# Patient Record
Sex: Male | Born: 1935 | Race: White | Hispanic: No | Marital: Married | Smoking: Former smoker
Health system: Southern US, Community
[De-identification: ages and names within clinical notes are randomized; demographics above are authoritative.]

## PROBLEM LIST (undated history)

## (undated) DIAGNOSIS — E119 Type 2 diabetes mellitus without complications: Secondary | ICD-10-CM

## (undated) DIAGNOSIS — G629 Polyneuropathy, unspecified: Secondary | ICD-10-CM

## (undated) DIAGNOSIS — Z8719 Personal history of other diseases of the digestive system: Secondary | ICD-10-CM

## (undated) DIAGNOSIS — I639 Cerebral infarction, unspecified: Secondary | ICD-10-CM

## (undated) DIAGNOSIS — G459 Transient cerebral ischemic attack, unspecified: Secondary | ICD-10-CM

## (undated) DIAGNOSIS — Z955 Presence of coronary angioplasty implant and graft: Secondary | ICD-10-CM

## (undated) DIAGNOSIS — I10 Essential (primary) hypertension: Secondary | ICD-10-CM

## (undated) DIAGNOSIS — Z9889 Other specified postprocedural states: Secondary | ICD-10-CM

## (undated) HISTORY — PX: OTHER SURGICAL HISTORY: SHX169

## (undated) HISTORY — PX: CARDIAC CATHETERIZATION: SHX172

## (undated) HISTORY — PX: HERNIA REPAIR: SHX51

## (undated) HISTORY — PX: EYE SURGERY: SHX253

## (undated) HISTORY — PX: PROSTATE SURGERY: SHX751

## (undated) HISTORY — PX: CORONARY ANGIOPLASTY: SHX604

---

## 2010-06-08 ENCOUNTER — Emergency Department (HOSPITAL_BASED_OUTPATIENT_CLINIC_OR_DEPARTMENT_OTHER): Admission: EM | Admit: 2010-06-08 | Discharge: 2010-06-08 | Payer: Self-pay | Admitting: Emergency Medicine

## 2010-06-08 ENCOUNTER — Ambulatory Visit: Payer: Self-pay | Admitting: Diagnostic Radiology

## 2015-05-14 ENCOUNTER — Emergency Department (HOSPITAL_COMMUNITY): Payer: Medicare Other

## 2015-05-14 ENCOUNTER — Encounter (HOSPITAL_COMMUNITY): Payer: Self-pay

## 2015-05-14 ENCOUNTER — Emergency Department (HOSPITAL_COMMUNITY)
Admission: EM | Admit: 2015-05-14 | Discharge: 2015-05-14 | Disposition: A | Discharge status: Home / Self Care | Payer: Medicare Other | Attending: Emergency Medicine | Admitting: Emergency Medicine

## 2015-05-14 DIAGNOSIS — Z8669 Personal history of other diseases of the nervous system and sense organs: Secondary | ICD-10-CM | POA: Diagnosis not present

## 2015-05-14 DIAGNOSIS — Z87891 Personal history of nicotine dependence: Secondary | ICD-10-CM | POA: Diagnosis not present

## 2015-05-14 DIAGNOSIS — X58XXXA Exposure to other specified factors, initial encounter: Secondary | ICD-10-CM | POA: Insufficient documentation

## 2015-05-14 DIAGNOSIS — Z7982 Long term (current) use of aspirin: Secondary | ICD-10-CM | POA: Insufficient documentation

## 2015-05-14 DIAGNOSIS — Y9389 Activity, other specified: Secondary | ICD-10-CM | POA: Insufficient documentation

## 2015-05-14 DIAGNOSIS — Z79899 Other long term (current) drug therapy: Secondary | ICD-10-CM | POA: Insufficient documentation

## 2015-05-14 DIAGNOSIS — Y998 Other external cause status: Secondary | ICD-10-CM | POA: Diagnosis not present

## 2015-05-14 DIAGNOSIS — T18128A Food in esophagus causing other injury, initial encounter: Secondary | ICD-10-CM

## 2015-05-14 DIAGNOSIS — Y9289 Other specified places as the place of occurrence of the external cause: Secondary | ICD-10-CM | POA: Diagnosis not present

## 2015-05-14 DIAGNOSIS — E119 Type 2 diabetes mellitus without complications: Secondary | ICD-10-CM | POA: Diagnosis not present

## 2015-05-14 DIAGNOSIS — Z8673 Personal history of transient ischemic attack (TIA), and cerebral infarction without residual deficits: Secondary | ICD-10-CM | POA: Insufficient documentation

## 2015-05-14 DIAGNOSIS — I10 Essential (primary) hypertension: Secondary | ICD-10-CM | POA: Diagnosis not present

## 2015-05-14 DIAGNOSIS — T18120A Food in esophagus causing compression of trachea, initial encounter: Secondary | ICD-10-CM | POA: Diagnosis present

## 2015-05-14 DIAGNOSIS — Z9861 Coronary angioplasty status: Secondary | ICD-10-CM | POA: Diagnosis not present

## 2015-05-14 DIAGNOSIS — K222 Esophageal obstruction: Secondary | ICD-10-CM

## 2015-05-14 HISTORY — DX: Presence of coronary angioplasty implant and graft: Z95.5

## 2015-05-14 HISTORY — DX: Other specified postprocedural states: Z98.890

## 2015-05-14 HISTORY — DX: Type 2 diabetes mellitus without complications: E11.9

## 2015-05-14 HISTORY — DX: Transient cerebral ischemic attack, unspecified: G45.9

## 2015-05-14 HISTORY — DX: Personal history of other diseases of the digestive system: Z87.19

## 2015-05-14 HISTORY — DX: Polyneuropathy, unspecified: G62.9

## 2015-05-14 HISTORY — DX: Essential (primary) hypertension: I10

## 2015-05-14 LAB — BASIC METABOLIC PANEL
Anion gap: 9 (ref 5–15)
BUN: 34 mg/dL — ABNORMAL HIGH (ref 6–20)
CALCIUM: 9.4 mg/dL (ref 8.9–10.3)
CO2: 27 mmol/L (ref 22–32)
CREATININE: 1.74 mg/dL — AB (ref 0.61–1.24)
Chloride: 107 mmol/L (ref 101–111)
GFR, EST AFRICAN AMERICAN: 39 mL/min — AB (ref >60)
GFR, EST NON AFRICAN AMERICAN: 34 mL/min — AB (ref >60)
Glucose, Bld: 147 mg/dL — ABNORMAL HIGH (ref 65–99)
Potassium: 3.4 mmol/L — ABNORMAL LOW (ref 3.5–5.1)
SODIUM: 143 mmol/L (ref 135–145)

## 2015-05-14 LAB — CBC WITH DIFFERENTIAL/PLATELET
BASOS PCT: 0 % (ref 0–1)
Basophils Absolute: 0 10*3/uL (ref 0.0–0.1)
EOS ABS: 0.2 10*3/uL (ref 0.0–0.7)
EOS PCT: 2 % (ref 0–5)
HCT: 39.7 % (ref 39.0–52.0)
Hemoglobin: 13.9 g/dL (ref 13.0–17.0)
Lymphocytes Relative: 18 % (ref 12–46)
Lymphs Abs: 1.8 10*3/uL (ref 0.7–4.0)
MCH: 30.1 pg (ref 26.0–34.0)
MCHC: 35 g/dL (ref 30.0–36.0)
MCV: 85.9 fL (ref 78.0–100.0)
MONO ABS: 0.6 10*3/uL (ref 0.1–1.0)
MONOS PCT: 6 % (ref 3–12)
Neutro Abs: 7.8 10*3/uL — ABNORMAL HIGH (ref 1.7–7.7)
Neutrophils Relative %: 74 % (ref 43–77)
PLATELETS: 161 10*3/uL (ref 150–400)
RBC: 4.62 MIL/uL (ref 4.22–5.81)
RDW: 12.4 % (ref 11.5–15.5)
WBC: 10.4 10*3/uL (ref 4.0–10.5)

## 2015-05-14 MED ORDER — DIAZEPAM 5 MG/ML IJ SOLN
2.5000 mg | Freq: Once | INTRAMUSCULAR | Status: AC
  Administered 2015-05-14: 2.5 mg via INTRAVENOUS
  Filled 2015-05-14: qty 2

## 2015-05-14 MED ORDER — GLUCAGON HCL RDNA (DIAGNOSTIC) 1 MG IJ SOLR
1.0000 mg | Freq: Once | INTRAMUSCULAR | Status: AC
  Administered 2015-05-14: 1 mg via INTRAVENOUS
  Filled 2015-05-14: qty 1

## 2015-05-14 MED ORDER — NITROGLYCERIN 0.4 MG SL SUBL
0.4000 mg | SUBLINGUAL_TABLET | Freq: Once | SUBLINGUAL | Status: AC
  Administered 2015-05-14: 0.4 mg via SUBLINGUAL
  Filled 2015-05-14: qty 1

## 2015-05-14 NOTE — ED Notes (Signed)
Per EMS, pt from home. 3 hours ago, pt choked on a piece of apple.  Pt coughed up small amount of apple.  Pt exhibits in no resp distress and speaks in complete sentences.  Pt feels "scratchy" feeling substernal.  Problems in past with aspiration.  Swallowing problems in past with dilation of esophagus.  Vitals: 118/68, hr 72, resp 16, 98% ra.  cbg 140

## 2015-05-14 NOTE — ED Notes (Signed)
Bed: WA03 Expected date:  Expected time:  Means of arrival:  Comments: EMS- 81yo M, "got choked on an apple"

## 2015-05-14 NOTE — Discharge Instructions (Signed)
Esophageal Stricture °The esophagus is the long, narrow tube which carries food and liquid from the mouth to the stomach. Sometimes a part of the esophagus becomes narrow and makes it difficult, painful, or even impossible to swallow. This is called an esophageal stricture.  °CAUSES  °Common causes of blockage or strictures of the esophagus are: °· Exposure of the lower esophagus to the acid from the stomach may cause narrowing. °· Hiatal hernia in which a small part of the stomach bulges up through the diaphragm can cause a narrowing in the bottom of the esophagus. °· Scleroderma is a tissue disorder that affects the esophagus and makes swallowing difficult. °· Achalasia is an absence of nerves in the lower esophagus and to the esophageal sphincter. This absence of nerves may be congenital (present since birth). This can cause irregular spasms which do not allow food and fluid through. °· Strictures may develop from swallowing materials which damage the esophagus. Examples are acids or alkalis such as lye. °· Schatzki's Ring is a narrow ring of non-cancerous tissue which narrows the lower esophagus. The cause of this is unknown. °· Growths can block the esophagus. °SYMPTOMS  °Some of the problems are difficulty swallowing or pain with swallowing. °DIAGNOSIS  °Your caregiver often suspects this problem by taking a medical history. They will also do a physical exam. They may then take X-rays and/or perform an endoscopy. Endoscopy is an exam in which a tube like a small flexible telescope is used to look at your esophagus.  °TREATMENT °· One form of treatment is to dilate the narrow area. This means to stretch it. °· When this is not successful, chest surgery may be required. This is a much more extensive form of treatment with a longer recovery time. °Both of the above treatments make the passage of food and water into the stomach easier. They also make it easier for stomach contents to bubble back into the  esophagus. Special medications may be used following the procedure to help prevent further narrowing. Medications may be used to lower the amount of acid in the stomach juice.  °SEEK IMMEDIATE MEDICAL CARE IF:  °· Your swallowing is becoming more painful, difficult, or you are unable to swallow. °· You vomit up blood. °· You develop black tarry stools. °· You develop chills. °· You have a fever. °· You develop chest or abdominal pain. °· You develop shortness of breath, feel lightheaded, or faint. °Follow up with medical care as your caregiver suggests. °Document Released: 05/10/2006 Document Revised: 11/22/2011 Document Reviewed: 01/09/2014 °Exit[employer]® Patient Information ©2015 [employer]. This information is not intended to replace advice given to you by your health care provider. Make sure you discuss any questions you have with your health care provider. ° °

## 2015-05-14 NOTE — ED Provider Notes (Signed)
CSN: [id number]     Arrival date & time 05/14/15  1754 History   First MD Initiated Contact with Patient 05/14/15 1758     Chief Complaint  Patient presents with  . Aspiration  . Choking     (Consider location/radiation/quality/duration/timing/severity/associated sxs/prior Treatment) Patient is a 81 y.o. male presenting with foreign body swallowed.  Swallowed Foreign Body This is a recurrent problem. The current episode started 1 to 2 hours ago. The problem occurs constantly. Progression since onset: partially resolved. Pertinent negatives include no chest pain, no abdominal pain, no headaches and no shortness of breath. Nothing aggravates the symptoms. Nothing relieves the symptoms. He has tried nothing for the symptoms.    Past Medical History  Diagnosis Date  . Diabetes mellitus without complication   . TIA (transient ischemic attack)   . Neuropathy   . Status post dilation of esophageal narrowing   . Hypertension   . Stented coronary artery    Past Surgical History  Procedure Laterality Date  . Cardiac catheterization    . Hernia repair    . Prostate surgery    . Eye surgery     History reviewed. No pertinent family history. Social History  Substance Use Topics  . Smoking status: Former Games developer  . Smokeless tobacco: None  . Alcohol Use: Yes     Comment: daily    Review of Systems  Respiratory: Negative for shortness of breath.   Cardiovascular: Negative for chest pain.  Gastrointestinal: Negative for abdominal pain.  Neurological: Negative for headaches.  All other systems reviewed and are negative.     Allergies  Ibuprofen  Home Medications   Prior to Admission medications   Medication Sig Start Date End Date Taking? Authorizing Provider  aspirin 325 MG tablet Take 325 mg by mouth daily.   Yes Historical Provider, MD  atorvastatin (LIPITOR) 40 MG tablet Take 40 mg by mouth daily. 03/10/15 03/09/16 Yes Historical Provider, MD  B Complex Vitamins (VITAMIN-B  COMPLEX) TABS Take 1 tablet by mouth daily.   Yes Historical Provider, MD  Cholecalciferol (VITAMIN D-1000 MAX ST) 1000 UNITS tablet Take 1 capsule by mouth daily.   Yes Historical Provider, MD  esomeprazole (NEXIUM) 40 MG capsule Take 40 mg by mouth daily. 03/05/15  Yes Historical Provider, MD  glipiZIDE (GLUCOTROL XL) 10 MG 24 hr tablet Take 10 mg by mouth daily. 03/05/15  Yes Historical Provider, MD  losartan-hydrochlorothiazide (HYZAAR) 100-25 MG per tablet Take 1 tablet by mouth daily. 03/05/15  Yes Historical Provider, MD  metFORMIN (GLUCOPHAGE) 1000 MG tablet Take 1,000 mg by mouth 2 (two) times daily with a meal.   Yes Historical Provider, MD  pioglitazone (ACTOS) 45 MG tablet Take 45 mg by mouth daily. 03/10/15 03/09/16 Yes Historical Provider, MD   BP 137/108 mmHg  Pulse 94  Temp(Src) 99.5 F (37.5 C) (Oral)  Resp 20  SpO2 99% Physical Exam  Constitutional: He is oriented to person, place, and time. He appears well-developed and well-nourished.  HENT:  Head: Normocephalic and atraumatic.  Eyes: Conjunctivae and EOM are normal.  Neck: Normal range of motion. Neck supple.  Cardiovascular: Normal rate, regular rhythm and normal heart sounds.   Pulmonary/Chest: Effort normal and breath sounds normal. No respiratory distress.  Abdominal: He exhibits no distension. There is no tenderness. There is no rebound and no guarding.  Musculoskeletal: Normal range of motion.  Neurological: He is alert and oriented to person, place, and time.  Skin: Skin is warm and dry.  Vitals  reviewed.   ED Course  Procedures (including critical care time) Labs Review Labs Reviewed  CBC WITH DIFFERENTIAL/PLATELET - Abnormal; Notable for the following:    Neutro Abs 7.8 (*)    All other components within normal limits  BASIC METABOLIC PANEL - Abnormal; Notable for the following:    Potassium 3.4 (*)    Glucose, Bld 147 (*)    BUN 34 (*)    Creatinine, Ser 1.74 (*)    GFR calc non Af Amer 34 (*)     GFR calc Af Amer 39 (*)    All other components within normal limits    Imaging Review Dg Chest 2 View  05/14/2015   CLINICAL DATA:  Choked on piece of apple 3 hours ago with a scratchy substernal sensation. Previous esophageal dilatation procedure.  EXAM: CHEST  2 VIEW  COMPARISON:  None.  FINDINGS: Lungs are hypoinflated without consolidation or effusion. Cardiomediastinal silhouette is within normal. There is mild degenerate change of the spine.  IMPRESSION: Hypoinflation without acute cardiopulmonary disease.   Electronically Signed   By: Elberta Fortis M.D.   On: 05/14/2015 19:32   I have personally reviewed and evaluated these images and lab results as part of my medical decision-making.   EKG Interpretation None      MDM   Final diagnoses:  Esophageal obstruction due to food impaction    81 y.o. male with pertinent PMH of esophageal stricture sp dilatation presents with recurrent symptoms of impaction after eating an apple.  Unable to take PO on my exam, spitting up frequently.  Physical exam otherwise benign.    Given valium, nitro, and glucagon with resolution of symptoms.  Tolerated PO.  DC home in stable condition  I have reviewed all laboratory and imaging studies if ordered as above  1. Esophageal obstruction due to food impaction         Mirian Mo, MD 05/14/15 [phone]

## 2015-06-17 ENCOUNTER — Emergency Department (HOSPITAL_COMMUNITY): Payer: Medicare Other

## 2015-06-17 ENCOUNTER — Inpatient Hospital Stay (HOSPITAL_COMMUNITY)
Admission: EM | Admit: 2015-06-17 | Discharge: 2015-06-20 | DRG: 065 | Disposition: A | Discharge status: Skilled Nursing Facility | Payer: Medicare Other | Attending: Internal Medicine | Admitting: Internal Medicine

## 2015-06-17 ENCOUNTER — Encounter (HOSPITAL_COMMUNITY): Payer: Self-pay | Admitting: Emergency Medicine

## 2015-06-17 DIAGNOSIS — Z79891 Long term (current) use of opiate analgesic: Secondary | ICD-10-CM

## 2015-06-17 DIAGNOSIS — I672 Cerebral atherosclerosis: Secondary | ICD-10-CM | POA: Diagnosis present

## 2015-06-17 DIAGNOSIS — Z955 Presence of coronary angioplasty implant and graft: Secondary | ICD-10-CM | POA: Diagnosis not present

## 2015-06-17 DIAGNOSIS — Z87891 Personal history of nicotine dependence: Secondary | ICD-10-CM

## 2015-06-17 DIAGNOSIS — T502X5A Adverse effect of carbonic-anhydrase inhibitors, benzothiadiazides and other diuretics, initial encounter: Secondary | ICD-10-CM | POA: Diagnosis present

## 2015-06-17 DIAGNOSIS — I2510 Atherosclerotic heart disease of native coronary artery without angina pectoris: Secondary | ICD-10-CM

## 2015-06-17 DIAGNOSIS — E785 Hyperlipidemia, unspecified: Secondary | ICD-10-CM | POA: Diagnosis present

## 2015-06-17 DIAGNOSIS — E1122 Type 2 diabetes mellitus with diabetic chronic kidney disease: Secondary | ICD-10-CM | POA: Diagnosis present

## 2015-06-17 DIAGNOSIS — E876 Hypokalemia: Secondary | ICD-10-CM | POA: Diagnosis present

## 2015-06-17 DIAGNOSIS — R262 Difficulty in walking, not elsewhere classified: Secondary | ICD-10-CM | POA: Diagnosis present

## 2015-06-17 DIAGNOSIS — Z886 Allergy status to analgesic agent status: Secondary | ICD-10-CM

## 2015-06-17 DIAGNOSIS — R296 Repeated falls: Secondary | ICD-10-CM | POA: Diagnosis present

## 2015-06-17 DIAGNOSIS — I6789 Other cerebrovascular disease: Secondary | ICD-10-CM | POA: Diagnosis not present

## 2015-06-17 DIAGNOSIS — I129 Hypertensive chronic kidney disease with stage 1 through stage 4 chronic kidney disease, or unspecified chronic kidney disease: Secondary | ICD-10-CM | POA: Diagnosis present

## 2015-06-17 DIAGNOSIS — N401 Enlarged prostate with lower urinary tract symptoms: Secondary | ICD-10-CM | POA: Diagnosis present

## 2015-06-17 DIAGNOSIS — R4781 Slurred speech: Secondary | ICD-10-CM | POA: Diagnosis present

## 2015-06-17 DIAGNOSIS — I639 Cerebral infarction, unspecified: Principal | ICD-10-CM | POA: Diagnosis present

## 2015-06-17 DIAGNOSIS — N183 Chronic kidney disease, stage 3 (moderate): Secondary | ICD-10-CM | POA: Diagnosis present

## 2015-06-17 DIAGNOSIS — F05 Delirium due to known physiological condition: Secondary | ICD-10-CM | POA: Diagnosis present

## 2015-06-17 DIAGNOSIS — G629 Polyneuropathy, unspecified: Secondary | ICD-10-CM | POA: Diagnosis present

## 2015-06-17 DIAGNOSIS — F0390 Unspecified dementia without behavioral disturbance: Secondary | ICD-10-CM | POA: Diagnosis present

## 2015-06-17 DIAGNOSIS — Z79899 Other long term (current) drug therapy: Secondary | ICD-10-CM

## 2015-06-17 DIAGNOSIS — I6522 Occlusion and stenosis of left carotid artery: Secondary | ICD-10-CM | POA: Diagnosis not present

## 2015-06-17 DIAGNOSIS — R2681 Unsteadiness on feet: Secondary | ICD-10-CM | POA: Diagnosis not present

## 2015-06-17 DIAGNOSIS — I6529 Occlusion and stenosis of unspecified carotid artery: Secondary | ICD-10-CM | POA: Diagnosis present

## 2015-06-17 DIAGNOSIS — Z7982 Long term (current) use of aspirin: Secondary | ICD-10-CM | POA: Diagnosis not present

## 2015-06-17 DIAGNOSIS — E1149 Type 2 diabetes mellitus with other diabetic neurological complication: Secondary | ICD-10-CM | POA: Diagnosis present

## 2015-06-17 DIAGNOSIS — R338 Other retention of urine: Secondary | ICD-10-CM | POA: Diagnosis present

## 2015-06-17 LAB — URINALYSIS, ROUTINE W REFLEX MICROSCOPIC
Glucose, UA: NEGATIVE mg/dL
HGB URINE DIPSTICK: NEGATIVE
Ketones, ur: NEGATIVE mg/dL
LEUKOCYTES UA: NEGATIVE
Nitrite: NEGATIVE
PROTEIN: 30 mg/dL — AB
SPECIFIC GRAVITY, URINE: 1.027 (ref 1.005–1.030)
UROBILINOGEN UA: 1 mg/dL (ref 0.0–1.0)
pH: 5.5 (ref 5.0–8.0)

## 2015-06-17 LAB — CBC WITH DIFFERENTIAL/PLATELET
Basophils Absolute: 0 10*3/uL (ref 0.0–0.1)
Basophils Relative: 0 %
Eosinophils Absolute: 0.3 10*3/uL (ref 0.0–0.7)
Eosinophils Relative: 4 %
HEMATOCRIT: 37.6 % — AB (ref 39.0–52.0)
HEMOGLOBIN: 13.2 g/dL (ref 13.0–17.0)
LYMPHS ABS: 2.2 10*3/uL (ref 0.7–4.0)
LYMPHS PCT: 28 %
MCH: 30.1 pg (ref 26.0–34.0)
MCHC: 35.1 g/dL (ref 30.0–36.0)
MCV: 85.8 fL (ref 78.0–100.0)
MONOS PCT: 9 %
Monocytes Absolute: 0.7 10*3/uL (ref 0.1–1.0)
NEUTROS ABS: 4.8 10*3/uL (ref 1.7–7.7)
NEUTROS PCT: 59 %
Platelets: 183 10*3/uL (ref 150–400)
RBC: 4.38 MIL/uL (ref 4.22–5.81)
RDW: 12.5 % (ref 11.5–15.5)
WBC: 8 10*3/uL (ref 4.0–10.5)

## 2015-06-17 LAB — BASIC METABOLIC PANEL
Anion gap: 9 (ref 5–15)
BUN: 33 mg/dL — AB (ref 6–20)
CHLORIDE: 106 mmol/L (ref 101–111)
CO2: 28 mmol/L (ref 22–32)
CREATININE: 1.57 mg/dL — AB (ref 0.61–1.24)
Calcium: 8.8 mg/dL — ABNORMAL LOW (ref 8.9–10.3)
GFR calc non Af Amer: 38 mL/min — ABNORMAL LOW (ref >60)
GFR, EST AFRICAN AMERICAN: 44 mL/min — AB (ref >60)
Glucose, Bld: 181 mg/dL — ABNORMAL HIGH (ref 65–99)
POTASSIUM: 3.1 mmol/L — AB (ref 3.5–5.1)
Sodium: 143 mmol/L (ref 135–145)

## 2015-06-17 LAB — I-STAT TROPONIN, ED: TROPONIN I, POC: 0 ng/mL (ref 0.00–0.08)

## 2015-06-17 LAB — URINE MICROSCOPIC-ADD ON

## 2015-06-17 LAB — CBG MONITORING, ED: Glucose-Capillary: 185 mg/dL — ABNORMAL HIGH (ref 65–99)

## 2015-06-17 NOTE — ED Notes (Signed)
Patient passed swallow screen with no problem

## 2015-06-17 NOTE — ED Provider Notes (Signed)
CSN: [id number]     Arrival date & time 06/17/15  1716 History   First MD Initiated Contact with Patient 06/17/15 1908     Chief Complaint  Patient presents with  . Fall     (Consider location/radiation/quality/duration/timing/severity/associated sxs/prior Treatment) HPI Comments: Patient presents to the emergency department with chief complaint of fall. Patient states that he has been having increasingly frequent falls over the past 2 weeks. States that he feels like he loses his balance, and cannot get his feet back underneath him. He is here with his family members, he states that he has fallen approximately 3 or 4 times today. They are unable to help the patient back to his feet, and have had called EMS 3 times. Patient reports an episode of slurred speech and confusion several weeks ago, but states that he took an aspirin, and went back to sleep, and then this resolved. Patient states that he has some right-sided rib pain from a previous fall, but denies any other chest pain or shortness breath. He denies any abdominal pain. States that he has had some dysuria, but states that this is not new for him.  The history is provided by the patient. No language interpreter was used.    Past Medical History  Diagnosis Date  . Diabetes mellitus without complication (HCC)   . TIA (transient ischemic attack)   . Neuropathy (HCC)   . Status post dilation of esophageal narrowing   . Hypertension   . Stented coronary artery    Past Surgical History  Procedure Laterality Date  . Cardiac catheterization    . Hernia repair    . Prostate surgery    . Eye surgery     No family history on file. Social History  Substance Use Topics  . Smoking status: Former Games developer  . Smokeless tobacco: None  . Alcohol Use: Yes     Comment: daily    Review of Systems  Constitutional: Negative for fever and chills.  Respiratory: Negative for shortness of breath.   Cardiovascular: Negative for chest pain.   Gastrointestinal: Negative for nausea, vomiting, diarrhea and constipation.  Genitourinary: Negative for dysuria.  Neurological: Positive for weakness.  All other systems reviewed and are negative.     Allergies  Ibuprofen  Home Medications   Prior to Admission medications   Medication Sig Start Date End Date Taking? Authorizing Provider  aspirin 325 MG tablet Take 325 mg by mouth daily.   Yes Historical Provider, MD  atorvastatin (LIPITOR) 40 MG tablet Take 40 mg by mouth daily. 03/10/15 03/09/16 Yes Historical Provider, MD  esomeprazole (NEXIUM) 40 MG capsule Take 40 mg by mouth daily. 03/05/15  Yes Historical Provider, MD  glipiZIDE (GLUCOTROL XL) 10 MG 24 hr tablet Take 10 mg by mouth daily. 03/05/15  Yes Historical Provider, MD  HYDROcodone-acetaminophen (NORCO/VICODIN) 5-325 MG tablet Take 1 tablet by mouth every 6 (six) hours as needed for moderate pain or severe pain.  06/12/15  Yes Historical Provider, MD  losartan-hydrochlorothiazide (HYZAAR) 100-25 MG per tablet Take 1 tablet by mouth daily. 03/05/15  Yes Historical Provider, MD  pioglitazone (ACTOS) 45 MG tablet Take 45 mg by mouth daily. 03/10/15 03/09/16 Yes Historical Provider, MD   BP 132/55 mmHg  Pulse 50  Temp(Src) 97.8 F (36.6 C) (Oral)  Resp 16  SpO2 95% Physical Exam  Constitutional: He is oriented to person, place, and time. He appears well-developed and well-nourished.  HENT:  Head: Normocephalic and atraumatic.  Eyes: Conjunctivae and EOM  are normal. Pupils are equal, round, and reactive to light. Right eye exhibits no discharge. Left eye exhibits no discharge. No scleral icterus.  Neck: Normal range of motion. Neck supple. No JVD present.  Cardiovascular: Normal rate, regular rhythm and normal heart sounds.  Exam reveals no gallop and no friction rub.   No murmur heard. Pulmonary/Chest: Effort normal and breath sounds normal. No respiratory distress. He has no wheezes. He has no rales. He exhibits no  tenderness.  Abdominal: Soft. He exhibits no distension and no mass. There is no tenderness. There is no rebound and no guarding.  Musculoskeletal: Normal range of motion. He exhibits no edema or tenderness.  Moves all extremities  Neurological: He is alert and oriented to person, place, and time.  CN III-12 intact, speech is clear, movements are goal oriented  Skin: Skin is warm and dry.  Psychiatric: He has a normal mood and affect. His behavior is normal. Judgment and thought content normal.  Nursing note and vitals reviewed.   ED Course  Procedures (including critical care time) Results for orders placed or performed during the hospital encounter of 06/17/15  CBC with Differential/Platelet  Result Value Ref Range   WBC 8.0 4.0 - 10.5 K/uL   RBC 4.38 4.22 - 5.81 MIL/uL   Hemoglobin 13.2 13.0 - 17.0 g/dL   HCT 16.1 (L) 09.6 - 04.5 %   MCV 85.8 78.0 - 100.0 fL   MCH 30.1 26.0 - 34.0 pg   MCHC 35.1 30.0 - 36.0 g/dL   RDW 40.9 81.1 - 91.4 %   Platelets 183 150 - 400 K/uL   Neutrophils Relative % 59 %   Neutro Abs 4.8 1.7 - 7.7 K/uL   Lymphocytes Relative 28 %   Lymphs Abs 2.2 0.7 - 4.0 K/uL   Monocytes Relative 9 %   Monocytes Absolute 0.7 0.1 - 1.0 K/uL   Eosinophils Relative 4 %   Eosinophils Absolute 0.3 0.0 - 0.7 K/uL   Basophils Relative 0 %   Basophils Absolute 0.0 0.0 - 0.1 K/uL  Basic metabolic panel  Result Value Ref Range   Sodium 143 135 - 145 mmol/L   Potassium 3.1 (L) 3.5 - 5.1 mmol/L   Chloride 106 101 - 111 mmol/L   CO2 28 22 - 32 mmol/L   Glucose, Bld 181 (H) 65 - 99 mg/dL   BUN 33 (H) 6 - 20 mg/dL   Creatinine, Ser 7.82 (H) 0.61 - 1.24 mg/dL   Calcium 8.8 (L) 8.9 - 10.3 mg/dL   GFR calc non Af Amer 38 (L) >60 mL/min   GFR calc Af Amer 44 (L) >60 mL/min   Anion gap 9 5 - 15  POC CBG, ED  Result Value Ref Range   Glucose-Capillary 185 (H) 65 - 99 mg/dL  I-Stat Troponin, ED (not at [facility])  Result Value Ref Range   Troponin i, poc 0.00 0.00 - 0.08 ng/mL    Comment 3           Dg Chest 2 View  06/17/2015   CLINICAL DATA:  Falls.  EXAM: CHEST  2 VIEW  COMPARISON:  May 14, 2015.  FINDINGS: The heart size and mediastinal contours are within normal limits. Both lungs are clear. No pneumothorax or pleural effusion is noted. The visualized skeletal structures are unremarkable.  IMPRESSION: No active cardiopulmonary disease.   Electronically Signed   By: Lupita Raider, M.D.   On: 06/17/2015 20:37   Mr Brain Wo Contrast  06/17/2015  CLINICAL DATA:  Initial evaluation for increase falls for past 2 weeks. Loss of balance.  EXAM: MRI HEAD WITHOUT CONTRAST  TECHNIQUE: Multiplanar, multiecho pulse sequences of the brain and surrounding structures were obtained without intravenous contrast.  COMPARISON:  Prior CT from 09/21/2011.  FINDINGS: Study is markedly degraded by motion artifact.  Diffuse prominence of the CSF containing spaces is compatible with generalized age-related cerebral atrophy. Extensive patchy and confluent T2/FLAIR hyperintensity within the periventricular deep white matter both cerebral hemispheres noted, most likely related to moderate chronic small vessel ischemic disease. Encephalomalacia within the parasagittal anterior right frontal lobe most likely related to remote right ACA territory infarct. Remote lacunar infarct present within the right basal ganglia extending into the right corona radiata. Additional possible small remote infarcts within the right cerebellar hemisphere.  Diffusion-weighted sequences limited by motion artifact. There is patchy cortical infarct within the high left parietal lobe, best appreciated on coronal DWI sequence (series 5, image 11). Additionally, there is patchy infarcts extending superiorly along the deep/periventricular white matter of the left centrum semi ovale. Probable a few small foci of patchy cortical infarct more anteriorly within the left frontal lobe (series 702, image 21). Correlation with ADC map  markedly limited on this study due to motion artifact. Distribution of these infarcts suggestive of possible underlying watershed etiology.  Intravascular flow voids grossly maintained. No definite acute or chronic intracranial hemorrhage.  No definite mass lesion. No midline shift or mass effect. Ventricular prominence related to global parenchymal volume loss present without hydrocephalus. No extra-axial fluid collection.  Craniocervical junction within normal limits. Pituitary gland grossly unremarkable. No definite acute abnormality about the orbits. Probable lens extraction on the right.  Scattered mucosal thickening within the ethmoidal air cells and left maxillary sinus. No definite mastoid effusion.  Bone marrow signal intensity within normal limits. Scalp soft tissues unremarkable.  IMPRESSION: 1. Motion degraded study. 2. Patchy acute ischemic infarcts involving the cortical gray matter and deep/periventricular white matter of the left parietal lobe extending anteriorly into the left frontal lobe as above. 3. No other definite acute intracranial process. 4. Remote infarcts involving the anterior right frontal lobe, right basal ganglia/corona radiata, and possibly right cerebellar hemisphere. 5. Advanced age-related cerebral atrophy with moderate chronic microvascular ischemic disease.   Electronically Signed   By: Rise Mu M.D.   On: 06/17/2015 22:10      MDM   Final diagnoses:  Cerebrovascular accident (CVA), unspecified mechanism (HCC)    Patient with increasingly frequent falls. He does report recent slurred speech and confusion, but states that this resolved without taking aspirin and going back to sleep. This several weeks ago. Will check MRI, basic labs. Will reassess.  Patient with multiple strokes. Patient discussed with Dr. Roseanne Reno, who recommends transfer to [facility].  Appreciate Dr. Toniann Fail for admitting the patient.    Roxy Horseman, PA-C 06/17/15  2302  Lorre Nick, MD 06/17/15 [phone]

## 2015-06-17 NOTE — ED Notes (Signed)
Per MRI, patient moved a lot in the scanner.

## 2015-06-17 NOTE — ED Notes (Signed)
Patient transported to MRI 

## 2015-06-17 NOTE — ED Notes (Addendum)
Per EMS, patient has increased falls within the past two weeks.  He has fallen 3 times today.  Patient denies LOC or hitting his head.  Patient fell last night and hasn't taken medication since Sunday night. Patient is Alert and Orientated to himself.  Wife was told by physician that falls could be related to Parkinson's but he hasn't been diagnosed with disease. CBG: 321 Takes Metaformin but hasn't taken it since Sunday  Wife states patient has early onset of dementia.  Patient forgets day to day activity  BP: 131/60 P: 76 R: 18 96% on room air

## 2015-06-17 NOTE — Progress Notes (Signed)
CSW was consulted to speak with patient regarding facility information. CSW met with patient at bedside. Family was present.   Patient confirms that he comes from home. He states that he lives in [place] with his wife. Patient informed CSW that he often is able to dress himself, but needs assistance sometimes. Patient states that he has been able to bath himself, but does so slowly.  Wife confirms that she is the patient's primary support. Also, wife states that patient fell x4 today. Wife states that if patient needs rehab home health would be fine if he is able to stand on his feet. Patient seems to have a great support. Daughter states she visits patient daily and that she lives in [place].  CSW gave family a list of SNF. CSW educated family about medicare guidelines. Patient and family state that they do not have any questions at this time.  Wife/[name] [phone] Daughter/ [phone]  Willette Brace [phone] ED CSW 06/17/2015 10:10 PM

## 2015-06-18 ENCOUNTER — Encounter (HOSPITAL_COMMUNITY): Payer: Self-pay | Admitting: Internal Medicine

## 2015-06-18 ENCOUNTER — Inpatient Hospital Stay (HOSPITAL_COMMUNITY): Payer: Medicare Other

## 2015-06-18 DIAGNOSIS — I6529 Occlusion and stenosis of unspecified carotid artery: Secondary | ICD-10-CM | POA: Insufficient documentation

## 2015-06-18 DIAGNOSIS — E119 Type 2 diabetes mellitus without complications: Secondary | ICD-10-CM | POA: Insufficient documentation

## 2015-06-18 DIAGNOSIS — I2510 Atherosclerotic heart disease of native coronary artery without angina pectoris: Secondary | ICD-10-CM

## 2015-06-18 DIAGNOSIS — I639 Cerebral infarction, unspecified: Secondary | ICD-10-CM

## 2015-06-18 DIAGNOSIS — E785 Hyperlipidemia, unspecified: Secondary | ICD-10-CM

## 2015-06-18 DIAGNOSIS — I6522 Occlusion and stenosis of left carotid artery: Secondary | ICD-10-CM

## 2015-06-18 DIAGNOSIS — E1149 Type 2 diabetes mellitus with other diabetic neurological complication: Secondary | ICD-10-CM

## 2015-06-18 DIAGNOSIS — R2681 Unsteadiness on feet: Secondary | ICD-10-CM

## 2015-06-18 DIAGNOSIS — I6789 Other cerebrovascular disease: Secondary | ICD-10-CM

## 2015-06-18 LAB — COMPREHENSIVE METABOLIC PANEL
ALT: 17 U/L (ref 17–63)
ANION GAP: 11 (ref 5–15)
AST: 40 U/L (ref 15–41)
Albumin: 3.3 g/dL — ABNORMAL LOW (ref 3.5–5.0)
Alkaline Phosphatase: 73 U/L (ref 38–126)
BILIRUBIN TOTAL: 0.9 mg/dL (ref 0.3–1.2)
BUN: 25 mg/dL — ABNORMAL HIGH (ref 6–20)
CHLORIDE: 102 mmol/L (ref 101–111)
CO2: 29 mmol/L (ref 22–32)
Calcium: 8.6 mg/dL — ABNORMAL LOW (ref 8.9–10.3)
Creatinine, Ser: 1.54 mg/dL — ABNORMAL HIGH (ref 0.61–1.24)
GFR, EST AFRICAN AMERICAN: 45 mL/min — AB (ref >60)
GFR, EST NON AFRICAN AMERICAN: 39 mL/min — AB (ref >60)
Glucose, Bld: 166 mg/dL — ABNORMAL HIGH (ref 65–99)
POTASSIUM: 2.9 mmol/L — AB (ref 3.5–5.1)
Sodium: 142 mmol/L (ref 135–145)
TOTAL PROTEIN: 6.2 g/dL — AB (ref 6.5–8.1)

## 2015-06-18 LAB — LIPID PANEL
CHOL/HDL RATIO: 5 ratio
Cholesterol: 145 mg/dL (ref 0–200)
HDL: 29 mg/dL — AB (ref >40)
LDL Cholesterol: 92 mg/dL (ref 0–99)
TRIGLYCERIDES: 121 mg/dL (ref <150)
VLDL: 24 mg/dL (ref 0–40)

## 2015-06-18 LAB — GLUCOSE, CAPILLARY
GLUCOSE-CAPILLARY: 133 mg/dL — AB (ref 65–99)
Glucose-Capillary: 161 mg/dL — ABNORMAL HIGH (ref 65–99)
Glucose-Capillary: 184 mg/dL — ABNORMAL HIGH (ref 65–99)
Glucose-Capillary: 107 mg/dL — ABNORMAL HIGH (ref 65–99)

## 2015-06-18 LAB — CBC
HEMATOCRIT: 37.2 % — AB (ref 39.0–52.0)
Hemoglobin: 13 g/dL (ref 13.0–17.0)
MCH: 30 pg (ref 26.0–34.0)
MCHC: 34.9 g/dL (ref 30.0–36.0)
MCV: 85.9 fL (ref 78.0–100.0)
PLATELETS: 147 10*3/uL — AB (ref 150–400)
RBC: 4.33 MIL/uL (ref 4.22–5.81)
RDW: 12.4 % (ref 11.5–15.5)
WBC: 7.4 10*3/uL (ref 4.0–10.5)

## 2015-06-18 LAB — TROPONIN I

## 2015-06-18 LAB — CK: CK TOTAL: 560 U/L — AB (ref 49–397)

## 2015-06-18 MED ORDER — LORAZEPAM 0.5 MG PO TABS
0.5000 mg | ORAL_TABLET | Freq: Once | ORAL | Status: AC
  Administered 2015-06-18: 0.5 mg via ORAL
  Filled 2015-06-18: qty 1

## 2015-06-18 MED ORDER — INSULIN ASPART 100 UNIT/ML ~~LOC~~ SOLN
0.0000 [IU] | Freq: Three times a day (TID) | SUBCUTANEOUS | Status: DC
  Administered 2015-06-18 – 2015-06-19 (×3): 2 [IU] via SUBCUTANEOUS
  Administered 2015-06-19: 1 [IU] via SUBCUTANEOUS
  Administered 2015-06-20 (×2): 3 [IU] via SUBCUTANEOUS

## 2015-06-18 MED ORDER — SODIUM CHLORIDE 0.9 % IV SOLN
INTRAVENOUS | Status: DC
  Administered 2015-06-18: 02:00:00 via INTRAVENOUS

## 2015-06-18 MED ORDER — ASPIRIN 325 MG PO TABS
325.0000 mg | ORAL_TABLET | Freq: Every day | ORAL | Status: DC
  Administered 2015-06-18: 325 mg via ORAL
  Filled 2015-06-18: qty 1

## 2015-06-18 MED ORDER — ASPIRIN 300 MG RE SUPP: 300.0000 mg | Freq: Every day | RECTAL | Status: DC

## 2015-06-18 MED ORDER — STROKE: EARLY STAGES OF RECOVERY BOOK
Freq: Once | Status: AC
  Administered 2015-06-18: 02:00:00

## 2015-06-18 MED ORDER — PANTOPRAZOLE SODIUM 40 MG PO TBEC
40.0000 mg | DELAYED_RELEASE_TABLET | Freq: Every day | ORAL | Status: DC
  Administered 2015-06-18 – 2015-06-20 (×3): 40 mg via ORAL
  Filled 2015-06-18 (×3): qty 1

## 2015-06-18 MED ORDER — ATORVASTATIN CALCIUM 40 MG PO TABS
40.0000 mg | ORAL_TABLET | Freq: Every day | ORAL | Status: DC
  Administered 2015-06-18 – 2015-06-20 (×3): 40 mg via ORAL
  Filled 2015-06-18 (×3): qty 1

## 2015-06-18 MED ORDER — ENOXAPARIN SODIUM 40 MG/0.4ML ~~LOC~~ SOLN
40.0000 mg | SUBCUTANEOUS | Status: DC
  Administered 2015-06-18 – 2015-06-20 (×3): 40 mg via SUBCUTANEOUS
  Filled 2015-06-18 (×3): qty 0.4

## 2015-06-18 MED ORDER — GLIPIZIDE ER 10 MG PO TB24
10.0000 mg | ORAL_TABLET | Freq: Every day | ORAL | Status: DC
  Administered 2015-06-18 – 2015-06-20 (×3): 10 mg via ORAL
  Filled 2015-06-18 (×4): qty 1

## 2015-06-18 MED ORDER — PIOGLITAZONE HCL 45 MG PO TABS
45.0000 mg | ORAL_TABLET | Freq: Every day | ORAL | Status: DC
  Administered 2015-06-18 – 2015-06-20 (×3): 45 mg via ORAL
  Filled 2015-06-18 (×6): qty 1

## 2015-06-18 MED ORDER — CLOPIDOGREL BISULFATE 75 MG PO TABS
75.0000 mg | ORAL_TABLET | Freq: Every day | ORAL | Status: DC
  Administered 2015-06-18 – 2015-06-20 (×3): 75 mg via ORAL
  Filled 2015-06-18 (×3): qty 1

## 2015-06-18 MED ORDER — ASPIRIN 325 MG PO TABS: 325.0000 mg | ORAL_TABLET | Freq: Every day | ORAL | Status: DC

## 2015-06-18 MED ORDER — LORAZEPAM 2 MG/ML IJ SOLN: 0.5000 mg | Freq: Once | INTRAMUSCULAR | Status: DC

## 2015-06-18 MED ORDER — LOSARTAN POTASSIUM 50 MG PO TABS
100.0000 mg | ORAL_TABLET | Freq: Every day | ORAL | Status: DC
  Administered 2015-06-18 – 2015-06-20 (×3): 100 mg via ORAL
  Filled 2015-06-18 (×3): qty 2

## 2015-06-18 MED ORDER — SENNOSIDES-DOCUSATE SODIUM 8.6-50 MG PO TABS: 1.0000 | ORAL_TABLET | Freq: Every evening | ORAL | Status: DC | PRN

## 2015-06-18 MED ORDER — POTASSIUM CHLORIDE CRYS ER 20 MEQ PO TBCR
40.0000 meq | EXTENDED_RELEASE_TABLET | ORAL | Status: AC
  Administered 2015-06-18 (×2): 40 meq via ORAL
  Filled 2015-06-18 (×2): qty 2

## 2015-06-18 MED ORDER — ASPIRIN 81 MG PO CHEW
81.0000 mg | CHEWABLE_TABLET | Freq: Every day | ORAL | Status: DC
  Administered 2015-06-19 – 2015-06-20 (×2): 81 mg via ORAL
  Filled 2015-06-18 (×2): qty 1

## 2015-06-18 NOTE — Progress Notes (Signed)
[facility] PROGRESS NOTE  Parker Hunter WUJ:[id number] DOB: December 06, 1926 DOA: 06/17/2015 PCP: No primary care provider on file.  HPI Parker Hunter is an 81 y.o male who presented to the ED at [facility] with recurrent falls and worsening gait instability (x2 weeks). He fell 4x in 24 hours, denies dizziness or LOC. MRI showed acute ischemic infarct of L frontotemporal region as well as several remote infarcts. MRA showed no proximal artery occlusion. Patient expressed no changes in speech/vision and no focal extremity weakness. PMH includes: DM, TIA, HTN, and CAD (s/p stenting). Patient was admitted for further workup of acute CVA.   Subjective: Today the patient is doing well. Denies any HA, vision changes, or difficulty with movement/speech. Describes continued weakness in the R leg ("It just gives out on me").   Assessment/Plan: Principal Problem:  Acute CVA: Patient presented with worsening gait instability causing recurrent falls. MRI showed patchy L parietal and frontal cortical infarcts with remote infarcts (R. Frontal lobe, R. Basal ganglia, R. Cerebellar). MRA showed focal L M1 stenosis and atheromatous irregularities throughout. Carotid doppler showed no significant stenosis. 2D echo ordered, pending. LDL 92 (post CVA goal < 70, see below), HgbA1c pending. Neurology consulted, per recommendations patient will be treated with 81 mg Aspirin + Plavix 75 mg QD x3 months and then transitioned to Plavix alone. PT/OT/SLP pending.   Active Problems:  Hyperlipidemia: Managed at home with Lipitor 40 mg. LDL currently 92 (post CVA goal < 70). Continue at-home medication.   Diabetes, type 2: Managed at home with Glipizide and Actos. Discontinued Actos and initiated sliding scale insulin and carb modified diet on admission. CPB stable. A1c pending. Continue both and monitor.  Hypertension: Manages at home with Losartan-HCTZ. Discontinued HCTZ and continued Losartan on admission for permissive HTN. Stable  at 143/59. Permissive HTN appropriate until < 220/120. Continue and monitor.  CKD, stage 3: BUN/Cr 25/1.54 today, improving with IVF. Continue IVF and monitor.   Hypokalemia: Etiology unknown. 2.9 today, will replete and re-check in AM   Coronary Artery Disease: S/P stent, patient previously on Aspirin 325 QD. Transitioned to 81 mg Aspirin per stroke team. Denies CP, but continue to monitor.    Code Status: DNR Family Communication: None at bedside Disposition Plan: Pending evaluation, home or facility when stable  DVT Prophylaxis: Lovenox   Consultants:  Neurology   Procedures:  Echocardiogram   Antibiotics: None     Objective: Filed Vitals:   06/18/15 1029  BP: 143/59  Pulse: 72  Temp: 97.9 F (36.6 C)  Resp: 17   No intake or output data in the 24 hours ending 06/18/15 1058 Filed Weights   06/18/15 0203  Weight: 69.4 kg (153 lb)    Exam:   General:  Frail-appearing man, laying in bed, no acute distress  Extremities: L pinky finger s/p traumatic amputation  Cardiovascular: RRR, no m/r/g. No peripheral edema   Respiratory: CTA b/l, no wheeze or crackles   Abdomen: Soft, non-tender, non-distened  Neuro: A&Ox3, Grip strength equal b/l.  LLE: 3/5 strength, sensation intact, movement appropriate   RLE: 5/5 strength, sensation intact, movement appropriate  Data Reviewed: Basic Metabolic Panel:  Recent Labs Lab 06/17/15 1927 06/18/15 0248  NA 143 142  K 3.1* 2.9*  CL 106 102  CO2 28 29  GLUCOSE 181* 166*  BUN 33* 25*  CREATININE 1.57* 1.54*  CALCIUM 8.8* 8.6*   Liver Function Tests:  Recent Labs Lab 06/18/15 0248  AST 40  ALT 17  ALKPHOS 73  BILITOT  0.9  PROT 6.2*  ALBUMIN 3.3*   No results for input(s): LIPASE, AMYLASE in the last 168 hours. No results for input(s): AMMONIA in the last 168 hours. CBC:  Recent Labs Lab 06/17/15 1927 06/18/15 0248  WBC 8.0 7.4  NEUTROABS 4.8  --   HGB 13.2 13.0  HCT 37.6* 37.2*  MCV 85.8  85.9  PLT 183 147*   Cardiac Enzymes: No results for input(s): CKTOTAL, CKMB, CKMBINDEX, TROPONINI in the last 168 hours. BNP (last 3 results) No results for input(s): BNP in the last 8760 hours.  ProBNP (last 3 results) No results for input(s): PROBNP in the last 8760 hours.  CBG:  Recent Labs Lab 06/17/15 1844 06/18/15 0649  GLUCAP 185* 161*    No results found for this or any previous visit (from the past 240 hour(s)).   Studies: Dg Chest 2 View  06/17/2015   CLINICAL DATA:  Falls.  EXAM: CHEST  2 VIEW  COMPARISON:  May 14, 2015.  FINDINGS: The heart size and mediastinal contours are within normal limits. Both lungs are clear. No pneumothorax or pleural effusion is noted. The visualized skeletal structures are unremarkable.  IMPRESSION: No active cardiopulmonary disease.   Electronically Signed   By: Lupita Raider, M.D.   On: 06/17/2015 20:37   Dg Ribs Unilateral Right  06/18/2015   CLINICAL DATA:  Status post recent fall onto bathtub. Right anterior lower rib pain. Initial encounter.  EXAM: RIGHT RIBS - 2 VIEW  COMPARISON:  Chest radiograph performed 05/14/2015  FINDINGS: No displaced rib fractures are seen.  The visualized portions of the lungs are clear. The cardiomediastinal silhouette is incompletely characterized, but appears grossly unremarkable.  IMPRESSION: No displaced rib fracture seen.   Electronically Signed   By: Roanna Raider M.D.   On: 06/18/2015 03:40   Mr Brain Wo Contrast  06/17/2015   CLINICAL DATA:  Initial evaluation for increase falls for past 2 weeks. Loss of balance.  EXAM: MRI HEAD WITHOUT CONTRAST  TECHNIQUE: Multiplanar, multiecho pulse sequences of the brain and surrounding structures were obtained without intravenous contrast.  COMPARISON:  Prior CT from 09/21/2011.  FINDINGS: Study is markedly degraded by motion artifact.  Diffuse prominence of the CSF containing spaces is compatible with generalized age-related cerebral atrophy. Extensive patchy  and confluent T2/FLAIR hyperintensity within the periventricular deep white matter both cerebral hemispheres noted, most likely related to moderate chronic small vessel ischemic disease. Encephalomalacia within the parasagittal anterior right frontal lobe most likely related to remote right ACA territory infarct. Remote lacunar infarct present within the right basal ganglia extending into the right corona radiata. Additional possible small remote infarcts within the right cerebellar hemisphere.  Diffusion-weighted sequences limited by motion artifact. There is patchy cortical infarct within the high left parietal lobe, best appreciated on coronal DWI sequence (series 5, image 11). Additionally, there is patchy infarcts extending superiorly along the deep/periventricular white matter of the left centrum semi ovale. Probable a few small foci of patchy cortical infarct more anteriorly within the left frontal lobe (series 702, image 21). Correlation with ADC map markedly limited on this study due to motion artifact. Distribution of these infarcts suggestive of possible underlying watershed etiology.  Intravascular flow voids grossly maintained. No definite acute or chronic intracranial hemorrhage.  No definite mass lesion. No midline shift or mass effect. Ventricular prominence related to global parenchymal volume loss present without hydrocephalus. No extra-axial fluid collection.  Craniocervical junction within normal limits. Pituitary gland grossly unremarkable. No definite  acute abnormality about the orbits. Probable lens extraction on the right.  Scattered mucosal thickening within the ethmoidal air cells and left maxillary sinus. No definite mastoid effusion.  Bone marrow signal intensity within normal limits. Scalp soft tissues unremarkable.  IMPRESSION: 1. Motion degraded study. 2. Patchy acute ischemic infarcts involving the cortical gray matter and deep/periventricular white matter of the left parietal lobe  extending anteriorly into the left frontal lobe as above. 3. No other definite acute intracranial process. 4. Remote infarcts involving the anterior right frontal lobe, right basal ganglia/corona radiata, and possibly right cerebellar hemisphere. 5. Advanced age-related cerebral atrophy with moderate chronic microvascular ischemic disease.   Electronically Signed   By: Rise Mu M.D.   On: 06/17/2015 22:10   Mr Maxine Glenn Head/brain Wo Cm  06/18/2015   CLINICAL DATA:  Initial evaluation for stroke.  EXAM: MRA HEAD WITHOUT CONTRAST  TECHNIQUE: Angiographic images of the Circle of Willis were obtained using MRA technique without intravenous contrast.  COMPARISON:  Prior MRI 06/17/2015.  FINDINGS: ANTERIOR CIRCULATION:  Visualized distal cervical segments of the internal carotid arteries are patent with antegrade flow. The petrous segments are well opacified. Scattered atheromatous irregularity present within mean cavernous/ supraclinoid segments bilaterally without hemodynamically significant stenosis. Mild to moderate narrowing of the supra clinoid segments bilaterally.  Right A1 segment is hypoplastic and irregular. Left A1 segment is well opacified. Anterior communicating artery normal. Anterior cerebral arteries grossly opacified to their distal aspects. Probable multi focal atheromatous irregularity within the anterior cerebral arteries bilaterally.  Right M1 segment patent to the right MCA bifurcation it without focal stenosis or occlusion. Distal right MCA branches well opacified but demonstrate multi focal atheromatous irregularity.  Left M1 segment patent proximally. There is a focal short segment high-grade stenosis within the distal left M1 segment measuring approximately 2 mm in length (series 304, image 10). Left MCA branches are opacified distally.  POSTERIOR CIRCULATION:  Left vertebral artery is dominant and patent to the vertebrobasilar junction. Multi focal atheromatous irregularity with  moderate narrowing present within the distal left V4 segment. Right vertebral artery and demonstrates multi focal atheromatous irregularity but is patent to the vertebrobasilar junction as well. Posterior inferior cerebral arteries not well evaluated on this exam. Basilar patent with multi focal atheromatous irregularity and mild stenoses. Superior cerebellar arteries opacified proximally. Left posterior cerebral artery arises from the basilar artery. Left P1 segment widely patent. Left P2 segment opacified to its distal aspect. Distal branch atheromatous irregularity within the distal left PCA. There is fetal origin of the right PCA with a in widely patent right posterior communicating artery. Right PCA demonstrates multi focal atheromatous regularity and is somewhat attenuated distally.  No aneurysm or vascular malformation.  IMPRESSION: 1. No large or proximal arterial branch occlusion within the intracranial circulation. 2. Focal high-grade severe stenosis within the distal left M1 segment measuring 2 mm in length. 3. Additional fairly advanced multi focal atheromatous irregularity throughout the intracranial circulation as above.   Electronically Signed   By: Rise Mu M.D.   On: 06/18/2015 04:51    Scheduled Meds: . aspirin  81 mg Oral Daily  . atorvastatin  40 mg Oral Daily  . clopidogrel  75 mg Oral Daily  . enoxaparin (LOVENOX) injection  40 mg Subcutaneous Q24H  . glipiZIDE  10 mg Oral Q breakfast  . insulin aspart  0-9 Units Subcutaneous TID WC  . LORazepam  0.5 mg Intravenous Once  . losartan  100 mg Oral Daily  . pantoprazole  40 mg Oral Daily  . pioglitazone  45 mg Oral Daily  . potassium chloride  40 mEq Oral Q4H   Continuous Infusions: . sodium chloride 50 mL/hr at 06/18/15 0215    Principal Problem:   Stroke [facility]) Active Problems:   CAD (coronary artery disease) s/p stent   Hyperlipidemia   Diabetes mellitus type 2 with neurological manifestations  (HCC)      Evoleht Hovatter, Student-PA  [facility] If 7PM-7AM, please contact night-coverage at [email or web address], password [facility] 06/18/2015, 10:58 AM  LOS: 1 day

## 2015-06-18 NOTE — Progress Notes (Addendum)
  Echocardiogram Echocardiogram has been performed.  Parker Hunter 06/18/2015, 9:19 AM

## 2015-06-18 NOTE — Progress Notes (Signed)
VASCULAR LAB PRELIMINARY  PRELIMINARY  PRELIMINARY  PRELIMINARY  Carotid duplex completed.    Preliminary report:  Bilateral:  1-39% ICA stenosis.  Vertebral artery flow is antegrade.     Parker Hunter, RVS 06/18/2015, 8:39 AM

## 2015-06-18 NOTE — Progress Notes (Signed)
STROKE TEAM PROGRESS NOTE  Parker Hunter is an 81 y.o. male history diabetes mellitus, TIA, hypertension and coronary artery disease, complaining of increasing gait instability and recurrent falls. Patient was seen in the ED at [facility]. MRI of his brain was obtained which showed acute ischemic infarctions involving the left frontoparietal region with a watershed MCA-ACA distribution. Remote infarcts involving right frontal lobe and right basal ganglia and corona radiata as well as right cerebellum were noted. MRA showed no large proximal arterial occlusion intracranially. Focal high-grade is still left M1 stenosis was noted. He had no changes in speech. He's also had no visual changes. No focal extremity weakness has been experienced. Gait difficulty has been progressive for at least several months. LSN: Unclear tPA Given: No: Unclear when last known well SUBJECTIVE (INTERVAL HISTORY) No family is at the bedside.  Overall he feels his condition is stable. He reports a long history of falling. He denies any focal symptoms of stroke.He is interested in stroke trials if he is a candidate.    OBJECTIVE Temp:  [97.8 F (36.6 C)-98.6 F (37 C)] 98.6 F (37 C) (10/05 0750) Pulse Rate:  [50-76] 74 (10/05 0750) Cardiac Rhythm:  [-] Normal sinus rhythm (10/05 0701) Resp:  [15-165] 165 (10/05 0750) BP: (132-178)/(44-79) 139/44 mmHg (10/05 0750) SpO2:  [93 %-99 %] 98 % (10/05 0750) Weight:  [69.4 kg (153 lb)-73.029 kg (161 lb)] 69.4 kg (153 lb) (10/05 0203)  CBC:   Recent Labs Lab 06/17/15 1927 06/18/15 0248  WBC 8.0 7.4  NEUTROABS 4.8  --   HGB 13.2 13.0  HCT 37.6* 37.2*  MCV 85.8 85.9  PLT 183 147*    Basic Metabolic Panel:   Recent Labs Lab 06/17/15 1927 06/18/15 0248  NA 143 142  K 3.1* 2.9*  CL 106 102  CO2 28 29  GLUCOSE 181* 166*  BUN 33* 25*  CREATININE 1.57* 1.54*  CALCIUM 8.8* 8.6*    Lipid Panel:     Component Value Date/Time   CHOL 145 06/18/2015 0248   TRIG 121  06/18/2015 0248   HDL 29* 06/18/2015 0248   CHOLHDL 5.0 06/18/2015 0248   VLDL 24 06/18/2015 0248   LDLCALC 92 06/18/2015 0248   HgbA1c: No results found for: HGBA1C Urine Drug Screen: No results found for: LABOPIA, COCAINSCRNUR, LABBENZ, AMPHETMU, THCU, LABBARB    IMAGING  Dg Chest 2 View 06/17/2015    No active cardiopulmonary disease.     Dg Ribs Unilateral Right 06/18/2015   No displaced rib fracture seen.     Mr Brain Wo Contrast 06/17/2015   1. Motion degraded study. 2. Patchy acute ischemic infarcts involving the cortical gray matter and deep/periventricular white matter of the left parietal lobe extending anteriorly into the left frontal lobe as above. 3. No other definite acute intracranial process. 4. Remote infarcts involving the anterior right frontal lobe, right basal ganglia/corona radiata, and possibly right cerebellar hemisphere. 5. Advanced age-related cerebral atrophy with moderate chronic microvascular ischemic disease.     Mr Maxine Glenn Head/brain Wo Cm 06/18/2015    1. No large or proximal arterial branch occlusion within the intracranial circulation. 2. Focal high-grade severe stenosis within the distal left M1 segment measuring 2 mm in length. 3. Additional fairly advanced multi focal atheromatous irregularity throughout the intracranial circulation as above.     Carotid Doppler  There is 1-39% bilateral ICA stenosis. Vertebral artery flow is antegrade.     PHYSICAL EXAM Pleasant frail elderly Caucasian male not in distress. Marland Kitchen  Afebrile. Head is nontraumatic. Neck is supple without bruit.    Cardiac exam no murmur or gallop. Lungs are clear to auscultation. Distal pulses are well felt. Neurological Exam ;  Awake  Alert oriented x 3.Diminished attention and recall. Normal speech and language.eye movements full without nystagmus.fundi were not visualized. Vision acuity and fields appear normal. Hearing is normal. Palatal movements are normal. Face symmetric. Tongue  midline. Normal strength, tone, reflexes and coordination. Normal sensation. Gait deferred. ASSESSMENT/PLAN [name] is a 81 y.o. male with history of diabetes mellitus, TIA, hypertension and coronary artery disease presenting with recurrent falls and gait instability for several months. MRI showed acute infarcts. He did not receive IV t-PA due to unknown last known well.   Stroke:  Patchy left parietal and frontal coritcal infarcts felt to be either due to L M2 atherosclerosis vs embolic secondary to unknown source  Resultant no new neuro deficits  MRI  Patchy L parietal and frontal cortical infarcts; old R frontal, R BG and ? R cerebellar infarcts  MRA  Focal L M1 stenosis; multifocal atheromatous irregularity throughout  Carotid Doppler  No significant stenosis   2D Echo pending   LDL 92  HgbA1c pending  Lovenox 40 mg sq daily for VTE prophylaxis Diet heart healthy/carb modified Room service appropriate?: Yes; Fluid consistency:: Thin  aspirin 325 mg orally every day prior to admission, now on aspirin 325 mg orally every day. Given large vessel intracranial atherosclerosis, patient should be treated with aspirin 81 mg and clopidogrel 75 mg orally every day x 3 months for secondary stroke prevention. After 3 months, change to plavix alone. Long-term dual antiplatelets are contraindicated due to risk for intracerebral hemorrhage.   Patient counseled to be compliant with his antithrombotic medications  Ongoing aggressive stroke risk factor management  While he possibly could have an embolic source of his stroke, his hx of frequent, hard falls precludes further embolic evaluation  Therapy recommendations:  pending   Disposition:  pending (lives at home with wife, married x 62 years)  Hypertension  Stable  Permissive hypertension (OK if < 220/120) but gradually normalize in 5-7 days  Hyperlipidemia  Home meds:  lipitor 40, resumed in hospital  LDL 92, goal <  70  Continue statin at discharge  Diabetes type II  HgbA1c pending , goal < 7.0  Other Stroke Risk Factors  Advanced age  Family hx stroke (father)  Coronary artery disease - stent  Other Active Problems  Chronic kidney disease stay III  Hospital day # 1  BIBY,SHARON  [facility] See Amion for Pager information 06/18/2015 10:14 AM   I have personally examined this patient, reviewed notes, independently viewed imaging studies, participated in medical decision making and plan of care. I have made any additions or clarifications directly to the above note.  He has presented with chronic recurrent falls and gait difficulties will order other focal findings of her stroke but MRI scan shows an embolic left frontal and parietal infarct. Patient appears not to be a good long-term candidate for anticoagulation given history of innumerable falls hence we will not pursue workup for atrial fibrillation. He however remains at risk for recurrent strokes, TIAs or neurological worsening. Recommend dual antiplatelets therapy for 3 months for intracranial stenosis followed by single agent later. Delia Heady, MD Medical Director [facility] Pager: [phone] 06/18/2015 3:07 PM  To contact Stroke Continuity provider, please refer to [email or web address]. After hours, contact General Neurology

## 2015-06-18 NOTE — Consult Note (Signed)
Admission H&P    Chief Complaint: Recurrent falls.  HPI: Parker Hunter is an 81 y.o. male history diabetes mellitus, TIA, hypertension and coronary artery disease, complaining of increasing gait instability and recurrent falls. Patient was seen in the ED at [facility]. MRI of his brain was obtained which showed acute ischemic infarctions involving the left frontoparietal region with a watershed MCA-ACA distribution. Remote infarcts involving right frontal lobe and right basal ganglia and corona radiata as well as right cerebellum were noted. MRA showed no large proximal arterial occlusion intracranially. Focal high-grade is still left M1 stenosis was noted. He had no changes in speech. He's also had no visual changes. No focal extremity weakness has been experienced. Gait difficulty has been progressive for at least several months.  LSN: Unclear tPA Given: No: Unclear when last known well mRankin:  Past Medical History  Diagnosis Date  . Diabetes mellitus without complication ([place])   . TIA (transient ischemic attack)   . Neuropathy ([place])   . Status post dilation of esophageal narrowing   . Hypertension   . Stented coronary artery     Past Surgical History  Procedure Laterality Date  . Cardiac catheterization    . Hernia repair    . Prostate surgery    . Eye surgery    . Coronary angioplasty    . Esphageal dilation      Family History  Problem Relation Age of Onset  . Diabetes Mellitus II Mother   . Stroke Father   . Diabetes Mellitus II Sister    Social History:  reports that he has quit smoking. He does not have any smokeless tobacco history on file. He reports that he drinks alcohol. He reports that he does not use illicit drugs.  Allergies:  Allergies  Allergen Reactions  . Ibuprofen Other (See Comments)    GI Bleeding    Medications Prior to Admission  Medication Sig Dispense Refill  . aspirin 325 MG tablet Take 325 mg by mouth daily.    Marland Kitchen atorvastatin (LIPITOR) 40 MG  tablet Take 40 mg by mouth daily.    Marland Kitchen esomeprazole (NEXIUM) 40 MG capsule Take 40 mg by mouth daily.    Marland Kitchen glipiZIDE (GLUCOTROL XL) 10 MG 24 hr tablet Take 10 mg by mouth daily.    Marland Kitchen HYDROcodone-acetaminophen (NORCO/VICODIN) 5-325 MG tablet Take 1 tablet by mouth every 6 (six) hours as needed for moderate pain or severe pain.     Marland Kitchen losartan-hydrochlorothiazide (HYZAAR) 100-25 MG per tablet Take 1 tablet by mouth daily.    . pioglitazone (ACTOS) 45 MG tablet Take 45 mg by mouth daily.      ROS: History obtained from the patient  General ROS: negative for - chills, fatigue, fever, night sweats, weight gain or weight loss Psychological ROS: negative for - behavioral disorder, hallucinations, memory difficulties, mood swings or suicidal ideation Ophthalmic ROS: negative for - blurry vision, double vision, eye pain or loss of vision ENT ROS: negative for - epistaxis, nasal discharge, oral lesions, sore throat, tinnitus or vertigo Allergy and Immunology ROS: negative for - hives or itchy/watery eyes Hematological and Lymphatic ROS: negative for - bleeding problems, bruising or swollen lymph nodes Endocrine ROS: negative for - galactorrhea, hair pattern changes, polydipsia/polyuria or temperature intolerance Respiratory ROS: negative for - cough, hemoptysis, shortness of breath or wheezing Cardiovascular ROS: negative for - chest pain, dyspnea on exertion, edema or irregular heartbeat Gastrointestinal ROS: negative for - abdominal pain, diarrhea, hematemesis, nausea/vomiting or stool incontinence Genito-Urinary ROS:  negative for - dysuria, hematuria, incontinence or urinary frequency/urgency Musculoskeletal ROS: negative for - joint swelling or muscular weakness Neurological ROS: as noted in HPI Dermatological ROS: negative for rash and skin lesion changes  Physical Examination: Blood pressure 177/73, pulse 76, temperature 98.6 F (37 C), temperature source Oral, resp. rate 18, height 5' 6"   (1.676 m), weight 69.4 kg (153 lb), SpO2 99 %.  HEENT-  Normocephalic, no lesions, without obvious abnormality.  Normal external eye and conjunctiva.  Normal TM's bilaterally.  Normal auditory canals and external ears. Normal external nose, mucus membranes and septum.  Normal pharynx. Neck supple with no masses, nodes, nodules or enlargement. Cardiovascular - regular rate and rhythm, S1, S2 normal, no murmur, click, rub or gallop Lungs - chest clear, no wheezing, rales, normal symmetric air entry Abdomen - soft, non-tender; bowel sounds normal; no masses,  no organomegaly Extremities - redness and mild swelling of left knee, with moderate tenderness  Neurologic Examination: Mental Status: Alert, oriented, thought content appropriate.  Speech fluent without evidence of aphasia. Able to follow commands without difficulty. Cranial Nerves: II-Visual fields were normal. III/IV/VI-Pupils were equal and reacted normally to light. Extraocular movements were full and conjugate.    V/VII-no facial numbness and no facial weakness. VIII-normal. X-normal speech and symmetrical palatal movement. XI: trapezius strength/neck flexion strength normal bilaterally XII-midline tongue extension with normal strength. Motor: 5/5 bilaterally with normal tone and bulk Sensory: Normal throughout. Deep Tendon Reflexes: 1+ and symmetric. Plantars: Flexor on the left and extensor on the right Cerebellar: Normal finger-to-nose testing except for mild intention tremor bilaterally, right greater than left. Carotid auscultation: Normal  Results for orders placed or performed during the hospital encounter of 06/17/15 (from the past 48 hour(s))  POC CBG, ED     Status: Abnormal   Collection Time: 06/17/15  6:44 PM  Result Value Ref Range   Glucose-Capillary 185 (H) 65 - 99 mg/dL  CBC with Differential/Platelet     Status: Abnormal   Collection Time: 06/17/15  7:27 PM  Result Value Ref Range   WBC 8.0 4.0 - 10.5 K/uL    RBC 4.38 4.22 - 5.81 MIL/uL   Hemoglobin 13.2 13.0 - 17.0 g/dL   HCT 37.6 (L) 39.0 - 52.0 %   MCV 85.8 78.0 - 100.0 fL   MCH 30.1 26.0 - 34.0 pg   MCHC 35.1 30.0 - 36.0 g/dL   RDW 12.5 11.5 - 15.5 %   Platelets 183 150 - 400 K/uL   Neutrophils Relative % 59 %   Neutro Abs 4.8 1.7 - 7.7 K/uL   Lymphocytes Relative 28 %   Lymphs Abs 2.2 0.7 - 4.0 K/uL   Monocytes Relative 9 %   Monocytes Absolute 0.7 0.1 - 1.0 K/uL   Eosinophils Relative 4 %   Eosinophils Absolute 0.3 0.0 - 0.7 K/uL   Basophils Relative 0 %   Basophils Absolute 0.0 0.0 - 0.1 K/uL  Basic metabolic panel     Status: Abnormal   Collection Time: 06/17/15  7:27 PM  Result Value Ref Range   Sodium 143 135 - 145 mmol/L   Potassium 3.1 (L) 3.5 - 5.1 mmol/L   Chloride 106 101 - 111 mmol/L   CO2 28 22 - 32 mmol/L   Glucose, Bld 181 (H) 65 - 99 mg/dL   BUN 33 (H) 6 - 20 mg/dL   Creatinine, Ser 1.57 (H) 0.61 - 1.24 mg/dL   Calcium 8.8 (L) 8.9 - 10.3 mg/dL   GFR calc  non Af Amer 38 (L) >60 mL/min   GFR calc Af Amer 44 (L) >60 mL/min    Comment: (NOTE) The eGFR has been calculated using the CKD EPI equation. This calculation has not been validated in all clinical situations. eGFR's persistently <60 mL/min signify possible Chronic Kidney Disease.    Anion gap 9 5 - 15  I-Stat Troponin, ED (not at [facility])     Status: None   Collection Time: 06/17/15  7:55 PM  Result Value Ref Range   Troponin i, poc 0.00 0.00 - 0.08 ng/mL   Comment 3            Comment: Due to the release kinetics of cTnI, a negative result within the first hours of the onset of symptoms does not rule out myocardial infarction with certainty. If myocardial infarction is still suspected, repeat the test at appropriate intervals.   Urinalysis, Routine w reflex microscopic (not at [facility])     Status: Abnormal   Collection Time: 06/17/15 10:43 PM  Result Value Ref Range   Color, Urine AMBER (A) YELLOW    Comment: BIOCHEMICALS MAY BE AFFECTED BY COLOR    APPearance CLOUDY (A) CLEAR   Specific Gravity, Urine 1.027 1.005 - 1.030   pH 5.5 5.0 - 8.0   Glucose, UA NEGATIVE NEGATIVE mg/dL   Hgb urine dipstick NEGATIVE NEGATIVE   Bilirubin Urine SMALL (A) NEGATIVE   Ketones, ur NEGATIVE NEGATIVE mg/dL   Protein, ur 30 (A) NEGATIVE mg/dL   Urobilinogen, UA 1.0 0.0 - 1.0 mg/dL   Nitrite NEGATIVE NEGATIVE   Leukocytes, UA NEGATIVE NEGATIVE  Urine microscopic-add on     Status: Abnormal   Collection Time: 06/17/15 10:43 PM  Result Value Ref Range   Squamous Epithelial / LPF FEW (A) RARE   WBC, UA 0-2 <3 WBC/hpf   Urine-Other MUCOUS PRESENT   Lipid panel     Status: Abnormal   Collection Time: 06/18/15  2:48 AM  Result Value Ref Range   Cholesterol 145 0 - 200 mg/dL   Triglycerides 121 <150 mg/dL   HDL 29 (L) >40 mg/dL   Total CHOL/HDL Ratio 5.0 RATIO   VLDL 24 0 - 40 mg/dL   LDL Cholesterol 92 0 - 99 mg/dL    Comment:        Total Cholesterol/HDL:CHD Risk Coronary Heart Disease Risk Table                     Men   Women  1/2 Average Risk   3.4   3.3  Average Risk       5.0   4.4  2 X Average Risk   9.6   7.1  3 X Average Risk  23.4   11.0        Use the calculated Patient Ratio above and the CHD Risk Table to determine the patient's CHD Risk.        ATP III CLASSIFICATION (LDL):  <100     mg/dL   Optimal  100-129  mg/dL   Near or Above                    Optimal  130-159  mg/dL   Borderline  160-189  mg/dL   High  >190     mg/dL   Very High   CBC     Status: Abnormal   Collection Time: 06/18/15  2:48 AM  Result Value Ref Range   WBC 7.4 4.0 -  10.5 K/uL   RBC 4.33 4.22 - 5.81 MIL/uL   Hemoglobin 13.0 13.0 - 17.0 g/dL   HCT 37.2 (L) 39.0 - 52.0 %   MCV 85.9 78.0 - 100.0 fL   MCH 30.0 26.0 - 34.0 pg   MCHC 34.9 30.0 - 36.0 g/dL   RDW 12.4 11.5 - 15.5 %   Platelets 147 (L) 150 - 400 K/uL  Comprehensive metabolic panel     Status: Abnormal   Collection Time: 06/18/15  2:48 AM  Result Value Ref Range   Sodium 142 135 -  145 mmol/L   Potassium 2.9 (L) 3.5 - 5.1 mmol/L   Chloride 102 101 - 111 mmol/L   CO2 29 22 - 32 mmol/L   Glucose, Bld 166 (H) 65 - 99 mg/dL   BUN 25 (H) 6 - 20 mg/dL   Creatinine, Ser 1.54 (H) 0.61 - 1.24 mg/dL   Calcium 8.6 (L) 8.9 - 10.3 mg/dL   Total Protein 6.2 (L) 6.5 - 8.1 g/dL   Albumin 3.3 (L) 3.5 - 5.0 g/dL   AST 40 15 - 41 U/L   ALT 17 17 - 63 U/L   Alkaline Phosphatase 73 38 - 126 U/L   Total Bilirubin 0.9 0.3 - 1.2 mg/dL   GFR calc non Af Amer 39 (L) >60 mL/min   GFR calc Af Amer 45 (L) >60 mL/min    Comment: (NOTE) The eGFR has been calculated using the CKD EPI equation. This calculation has not been validated in all clinical situations. eGFR's persistently <60 mL/min signify possible Chronic Kidney Disease.    Anion gap 11 5 - 15   Dg Chest 2 View  06/17/2015   CLINICAL DATA:  Falls.  EXAM: CHEST  2 VIEW  COMPARISON:  May 14, 2015.  FINDINGS: The heart size and mediastinal contours are within normal limits. Both lungs are clear. No pneumothorax or pleural effusion is noted. The visualized skeletal structures are unremarkable.  IMPRESSION: No active cardiopulmonary disease.   Electronically Signed   By: Marijo Conception, M.D.   On: 06/17/2015 20:37   Dg Ribs Unilateral Right  06/18/2015   CLINICAL DATA:  Status post recent fall onto bathtub. Right anterior lower rib pain. Initial encounter.  EXAM: RIGHT RIBS - 2 VIEW  COMPARISON:  Chest radiograph performed 05/14/2015  FINDINGS: No displaced rib fractures are seen.  The visualized portions of the lungs are clear. The cardiomediastinal silhouette is incompletely characterized, but appears grossly unremarkable.  IMPRESSION: No displaced rib fracture seen.   Electronically Signed   By: Garald Balding M.D.   On: 06/18/2015 03:40   Mr Brain Wo Contrast  06/17/2015   CLINICAL DATA:  Initial evaluation for increase falls for past 2 weeks. Loss of balance.  EXAM: MRI HEAD WITHOUT CONTRAST  TECHNIQUE: Multiplanar, multiecho  pulse sequences of the brain and surrounding structures were obtained without intravenous contrast.  COMPARISON:  Prior CT from 09/21/2011.  FINDINGS: Study is markedly degraded by motion artifact.  Diffuse prominence of the CSF containing spaces is compatible with generalized age-related cerebral atrophy. Extensive patchy and confluent T2/FLAIR hyperintensity within the periventricular deep white matter both cerebral hemispheres noted, most likely related to moderate chronic small vessel ischemic disease. Encephalomalacia within the parasagittal anterior right frontal lobe most likely related to remote right ACA territory infarct. Remote lacunar infarct present within the right basal ganglia extending into the right corona radiata. Additional possible small remote infarcts within the right cerebellar hemisphere.  Diffusion-weighted sequences limited  by motion artifact. There is patchy cortical infarct within the high left parietal lobe, best appreciated on coronal DWI sequence (series 5, image 11). Additionally, there is patchy infarcts extending superiorly along the deep/periventricular white matter of the left centrum semi ovale. Probable a few small foci of patchy cortical infarct more anteriorly within the left frontal lobe (series 702, image 21). Correlation with ADC map markedly limited on this study due to motion artifact. Distribution of these infarcts suggestive of possible underlying watershed etiology.  Intravascular flow voids grossly maintained. No definite acute or chronic intracranial hemorrhage.  No definite mass lesion. No midline shift or mass effect. Ventricular prominence related to global parenchymal volume loss present without hydrocephalus. No extra-axial fluid collection.  Craniocervical junction within normal limits. Pituitary gland grossly unremarkable. No definite acute abnormality about the orbits. Probable lens extraction on the right.  Scattered mucosal thickening within the ethmoidal  air cells and left maxillary sinus. No definite mastoid effusion.  Bone marrow signal intensity within normal limits. Scalp soft tissues unremarkable.  IMPRESSION: 1. Motion degraded study. 2. Patchy acute ischemic infarcts involving the cortical gray matter and deep/periventricular white matter of the left parietal lobe extending anteriorly into the left frontal lobe as above. 3. No other definite acute intracranial process. 4. Remote infarcts involving the anterior right frontal lobe, right basal ganglia/corona radiata, and possibly right cerebellar hemisphere. 5. Advanced age-related cerebral atrophy with moderate chronic microvascular ischemic disease.   Electronically Signed   By: Jeannine Boga M.D.   On: 06/17/2015 22:10   Mr Jodene Nam Head/brain Wo Cm  06/18/2015   CLINICAL DATA:  Initial evaluation for stroke.  EXAM: MRA HEAD WITHOUT CONTRAST  TECHNIQUE: Angiographic images of the Circle of Willis were obtained using MRA technique without intravenous contrast.  COMPARISON:  Prior MRI 06/17/2015.  FINDINGS: ANTERIOR CIRCULATION:  Visualized distal cervical segments of the internal carotid arteries are patent with antegrade flow. The petrous segments are well opacified. Scattered atheromatous irregularity present within mean cavernous/ supraclinoid segments bilaterally without hemodynamically significant stenosis. Mild to moderate narrowing of the supra clinoid segments bilaterally.  Right A1 segment is hypoplastic and irregular. Left A1 segment is well opacified. Anterior communicating artery normal. Anterior cerebral arteries grossly opacified to their distal aspects. Probable multi focal atheromatous irregularity within the anterior cerebral arteries bilaterally.  Right M1 segment patent to the right MCA bifurcation it without focal stenosis or occlusion. Distal right MCA branches well opacified but demonstrate multi focal atheromatous irregularity.  Left M1 segment patent proximally. There is a focal  short segment high-grade stenosis within the distal left M1 segment measuring approximately 2 mm in length (series 304, image 10). Left MCA branches are opacified distally.  POSTERIOR CIRCULATION:  Left vertebral artery is dominant and patent to the vertebrobasilar junction. Multi focal atheromatous irregularity with moderate narrowing present within the distal left V4 segment. Right vertebral artery and demonstrates multi focal atheromatous irregularity but is patent to the vertebrobasilar junction as well. Posterior inferior cerebral arteries not well evaluated on this exam. Basilar patent with multi focal atheromatous irregularity and mild stenoses. Superior cerebellar arteries opacified proximally. Left posterior cerebral artery arises from the basilar artery. Left P1 segment widely patent. Left P2 segment opacified to its distal aspect. Distal branch atheromatous irregularity within the distal left PCA. There is fetal origin of the right PCA with a in widely patent right posterior communicating artery. Right PCA demonstrates multi focal atheromatous regularity and is somewhat attenuated distally.  No aneurysm or vascular malformation.  IMPRESSION: 1. No large or proximal arterial branch occlusion within the intracranial circulation. 2. Focal high-grade severe stenosis within the distal left M1 segment measuring 2 mm in length. 3. Additional fairly advanced multi focal atheromatous irregularity throughout the intracranial circulation as above.   Electronically Signed   By: Jeannine Boga M.D.   On: 06/18/2015 04:51    Assessment: 81 y.o. male with progressive gait difficulty and instability, as well as acute watershed distribution left MCA-ACA distribution.  Stroke Risk Factors - diabetes mellitus, family history and hypertension  Plan: 1. HgbA1c, fasting lipid panel 2. PT consult, OT consult, Speech consult 3. Echocardiogram 4. Carotid dopplers 5. Prophylactic therapy-Antiplatelet med:  Aspirin 6. Risk factor modification 7. Telemetry monitoring  C.R. Nicole Kindred, MD [facility] [phone]  06/18/2015, 6:53 AM

## 2015-06-18 NOTE — Care Management Note (Signed)
Case Management Note  Patient Details  Name: Cedarius Kersh MRN: [id number] Date of Birth: 02-17-1927  Subjective/Objective:                    Action/Plan: Patient admitted with a CVA. Patient is from home with his spouse. Awaiting PT/OT recommendations for discharge disposition. CM will continue to follow for discharge needs.  Expected Discharge Date:                  Expected Discharge Plan:  Home/Self Care  In-House Referral:     Discharge planning Services     Post Acute Care Choice:    Choice offered to:     DME Arranged:    DME Agency:     HH Arranged:    HH Agency:     Status of Service:  In process, will continue to follow  Medicare Important Message Given:    Date Medicare IM Given:    Medicare IM give by:    Date Additional Medicare IM Given:    Additional Medicare Important Message give by:     If discussed at Long Length of Stay Meetings, dates discussed:    Additional Comments:  Kermit Balo, RN 06/18/2015, 2:20 PM

## 2015-06-18 NOTE — Progress Notes (Signed)
Pt is a transferred from [facility]. Alert and oriented x3. Denies any pain. Tele applied and notified. Oriented to room. Will continue to monitor. Shella Spearing, RN

## 2015-06-18 NOTE — Progress Notes (Addendum)
Patient c/o pain with urination, states it is a stinging feeling. MD paged, no new orders. Per family, patient is seeing a urologist regarding this issue. Will continue to monitor closely.   Addendum: MD placed order for UA.

## 2015-06-18 NOTE — H&P (Signed)
[facility] History and Physical  Parker Hunter ZOX:[id number] DOB: [birth year] DOA: 06/17/2015  Referring physician: Roxy Horseman. PCP: No primary care provider on file.  Specialists: None.  Chief Complaint: Frequent falls. Difficulty walking.  HPI: Parker Hunter is a 81 y.o. male with history of CAD status post stenting, hypertension, chronic kidney disease stage III, diabetes mellitus type 2 presents to the because of persistent difficulty walking. Patient has been having these symptoms for over the last 2 weeks. Over the last 24 hours patient had 4 falls. Denies losing consciousness. Denies any dizziness. When patient tries to walk patient is having imbalance and falling. Patient had fallen on Sunday and was lying on the floor for many hours. Yesterday when he tried to walk he had fallen multiple times. Denies any palpitations chest pain. Denies any weakness of upper or lower extremities any visual symptoms or any difficulty talking or swallowing. In the ER on exam patient is able to move all extremities with good strength. But finds it difficult to walk. MRI of the brain shows stroke and patient has been admitted for further management.    Review of Systems: As presented in the history of presenting illness, rest negative.  Past Medical History  Diagnosis Date  . Diabetes mellitus without complication (HCC)   . TIA (transient ischemic attack)   . Neuropathy (HCC)   . Status post dilation of esophageal narrowing   . Hypertension   . Stented coronary artery    Past Surgical History  Procedure Laterality Date  . Cardiac catheterization    . Hernia repair    . Prostate surgery    . Eye surgery    . Coronary angioplasty    . Esphageal dilation     Social History:  reports that he has quit smoking. He does not have any smokeless tobacco history on file. He reports that he drinks alcohol. He reports that he does not use illicit drugs. Where does patient live home. Can patient  participate in ADLs? Yes.  Allergies  Allergen Reactions  . Ibuprofen Other (See Comments)    GI Bleeding    Family History:  Family History  Problem Relation Age of Onset  . Diabetes Mellitus II Mother   . Stroke Father   . Diabetes Mellitus II Sister       Prior to Admission medications   Medication Sig Start Date End Date Taking? Authorizing Provider  aspirin 325 MG tablet Take 325 mg by mouth daily.   Yes Historical Provider, MD  atorvastatin (LIPITOR) 40 MG tablet Take 40 mg by mouth daily. 03/10/15 03/09/16 Yes Historical Provider, MD  esomeprazole (NEXIUM) 40 MG capsule Take 40 mg by mouth daily. 03/05/15  Yes Historical Provider, MD  glipiZIDE (GLUCOTROL XL) 10 MG 24 hr tablet Take 10 mg by mouth daily. 03/05/15  Yes Historical Provider, MD  HYDROcodone-acetaminophen (NORCO/VICODIN) 5-325 MG tablet Take 1 tablet by mouth every 6 (six) hours as needed for moderate pain or severe pain.  06/12/15  Yes Historical Provider, MD  losartan-hydrochlorothiazide (HYZAAR) 100-25 MG per tablet Take 1 tablet by mouth daily. 03/05/15  Yes Historical Provider, MD  pioglitazone (ACTOS) 45 MG tablet Take 45 mg by mouth daily. 03/10/15 03/09/16 Yes Historical Provider, MD    Physical Exam: Filed Vitals:   06/17/15 2003 06/17/15 2248 06/17/15 2353 06/17/15 2358  BP: 144/62 136/56  133/59  Pulse: 72 66  75  Temp:   98.4 F (36.9 C) 98.4 F (36.9 C)  TempSrc:  Oral  Resp: SpO2: 97% 95%  96%     General:  Moderately built and nourished.  Eyes: Anicteric no pallor.  ENT: No discharge from the ears eyes nose and mouth.  Neck: No mass felt. No neck rigidity.  Cardiovascular: S1-S2 heard.  Respiratory: No rhonchi or crepitations.  Abdomen: Soft nontender bowel sounds present.  Skin: No rash.  Musculoskeletal: No edema.  Psychiatric: Appears normal.  Neurologic: Alert awake oriented to time place and person. Moves all extremities 5 x 5. No facial asymmetry. Tongue is  midline. No dysdiadochokinesia. No nystagmus.  Labs on Admission:  Basic Metabolic Panel:  Recent Labs Lab 06/17/15 1927  NA 143  K 3.1*  CL 106  CO2 28  GLUCOSE 181*  BUN 33*  CREATININE 1.57*  CALCIUM 8.8*   Liver Function Tests: No results for input(s): AST, ALT, ALKPHOS, BILITOT, PROT, ALBUMIN in the last 168 hours. No results for input(s): LIPASE, AMYLASE in the last 168 hours. No results for input(s): AMMONIA in the last 168 hours. CBC:  Recent Labs Lab 06/17/15 1927  WBC 8.0  NEUTROABS 4.8  HGB 13.2  HCT 37.6*  MCV 85.8  PLT 183   Cardiac Enzymes: No results for input(s): CKTOTAL, CKMB, CKMBINDEX, TROPONINI in the last 168 hours.  BNP (last 3 results) No results for input(s): BNP in the last 8760 hours.  ProBNP (last 3 results) No results for input(s): PROBNP in the last 8760 hours.  CBG:  Recent Labs Lab 06/17/15 1844  GLUCAP 185*    Radiological Exams on Admission: Dg Chest 2 View  06/17/2015   CLINICAL DATA:  Falls.  EXAM: CHEST  2 VIEW  COMPARISON:  May 14, 2015.  FINDINGS: The heart size and mediastinal contours are within normal limits. Both lungs are clear. No pneumothorax or pleural effusion is noted. The visualized skeletal structures are unremarkable.  IMPRESSION: No active cardiopulmonary disease.   Electronically Signed   By: Lupita Raider, M.D.   On: 06/17/2015 20:37   Mr Brain Wo Contrast  06/17/2015   CLINICAL DATA:  Initial evaluation for increase falls for past 2 weeks. Loss of balance.  EXAM: MRI HEAD WITHOUT CONTRAST  TECHNIQUE: Multiplanar, multiecho pulse sequences of the brain and surrounding structures were obtained without intravenous contrast.  COMPARISON:  Prior CT from 09/21/2011.  FINDINGS: Study is markedly degraded by motion artifact.  Diffuse prominence of the CSF containing spaces is compatible with generalized age-related cerebral atrophy. Extensive patchy and confluent T2/FLAIR hyperintensity within the  periventricular deep white matter both cerebral hemispheres noted, most likely related to moderate chronic small vessel ischemic disease. Encephalomalacia within the parasagittal anterior right frontal lobe most likely related to remote right ACA territory infarct. Remote lacunar infarct present within the right basal ganglia extending into the right corona radiata. Additional possible small remote infarcts within the right cerebellar hemisphere.  Diffusion-weighted sequences limited by motion artifact. There is patchy cortical infarct within the high left parietal lobe, best appreciated on coronal DWI sequence (series 5, image 11). Additionally, there is patchy infarcts extending superiorly along the deep/periventricular white matter of the left centrum semi ovale. Probable a few small foci of patchy cortical infarct more anteriorly within the left frontal lobe (series 702, image 21). Correlation with ADC map markedly limited on this study due to motion artifact. Distribution of these infarcts suggestive of possible underlying watershed etiology.  Intravascular flow voids grossly maintained. No definite acute or chronic intracranial hemorrhage.  No  definite mass lesion. No midline shift or mass effect. Ventricular prominence related to global parenchymal volume loss present without hydrocephalus. No extra-axial fluid collection.  Craniocervical junction within normal limits. Pituitary gland grossly unremarkable. No definite acute abnormality about the orbits. Probable lens extraction on the right.  Scattered mucosal thickening within the ethmoidal air cells and left maxillary sinus. No definite mastoid effusion.  Bone marrow signal intensity within normal limits. Scalp soft tissues unremarkable.  IMPRESSION: 1. Motion degraded study. 2. Patchy acute ischemic infarcts involving the cortical gray matter and deep/periventricular white matter of the left parietal lobe extending anteriorly into the left frontal lobe as  above. 3. No other definite acute intracranial process. 4. Remote infarcts involving the anterior right frontal lobe, right basal ganglia/corona radiata, and possibly right cerebellar hemisphere. 5. Advanced age-related cerebral atrophy with moderate chronic microvascular ischemic disease.   Electronically Signed   By: Rise Mu M.D.   On: 06/17/2015 22:10    EKG: Independently reviewed. Normal sinus rhythm.  Assessment/Plan Principal Problem:   Stroke [facility]) Active Problems:   CAD (coronary artery disease) s/p stent   Hyperlipidemia   Diabetes mellitus type 2 with neurological manifestations (HCC)   1. Stroke - neurologist on call Dr. Roseanne Reno has been consulted by the ER physician and at this time patient will be transferred to [facility] for further management. Patient is agreeable to transfer. Patient has been placed on swallow evaluation neuro checks. Aspirin. Check 2-D echo. Check MRA brain carotid Dopplers. Get physical therapy and OT consult. Further recommendations per neurologist. Check hemoglobin A1c and lipid panel. 2. Hypertension - will hold HCTZ and continue losartan and allow for permissive hypertension. Continue with gentle hydration. 3. Diabetes mellitus type 2 - continue home medications with sliding scale coverage. 4. CAD status post stenting - denies any chest pain. 5. Hyperlipidemia on statins. 6. Chronic kidney disease stage III - follow metabolic panel.  I have reviewed patient's EKG and chest x-ray personally. Patient will be transferred to [facility] and Dr.Niu will be the accepting physician.   DVT Prophylaxis Lovenox.  Code Status: DO NOT RESUSCITATE.  Family Communication: Patient's daughter and wife.  Disposition Plan: Admit to inpatient.    Parker Hunter N. [facility] Pager [phone].  If 7PM-7AM, please contact night-coverage [email or web address] Password [email or web address] 06/18/2015, 12:44 AM

## 2015-06-19 DIAGNOSIS — I6529 Occlusion and stenosis of unspecified carotid artery: Secondary | ICD-10-CM

## 2015-06-19 LAB — GLUCOSE, CAPILLARY
GLUCOSE-CAPILLARY: 108 mg/dL — AB (ref 65–99)
Glucose-Capillary: 133 mg/dL — ABNORMAL HIGH (ref 65–99)
Glucose-Capillary: 160 mg/dL — ABNORMAL HIGH (ref 65–99)
Glucose-Capillary: 157 mg/dL — ABNORMAL HIGH (ref 65–99)

## 2015-06-19 LAB — BASIC METABOLIC PANEL
ANION GAP: 8 (ref 5–15)
BUN: 22 mg/dL — ABNORMAL HIGH (ref 6–20)
CHLORIDE: 106 mmol/L (ref 101–111)
CO2: 28 mmol/L (ref 22–32)
Calcium: 8.6 mg/dL — ABNORMAL LOW (ref 8.9–10.3)
Creatinine, Ser: 1.26 mg/dL — ABNORMAL HIGH (ref 0.61–1.24)
GFR calc Af Amer: 58 mL/min — ABNORMAL LOW (ref >60)
GFR, EST NON AFRICAN AMERICAN: 50 mL/min — AB (ref >60)
Glucose, Bld: 128 mg/dL — ABNORMAL HIGH (ref 65–99)
POTASSIUM: 3.5 mmol/L (ref 3.5–5.1)
SODIUM: 142 mmol/L (ref 135–145)

## 2015-06-19 LAB — HEMOGLOBIN A1C
Hgb A1c MFr Bld: 7 % — ABNORMAL HIGH (ref 4.8–5.6)
Mean Plasma Glucose: 154 mg/dL

## 2015-06-19 MED ORDER — TAMSULOSIN HCL 0.4 MG PO CAPS
0.4000 mg | ORAL_CAPSULE | Freq: Every day | ORAL | Status: DC
  Administered 2015-06-19 – 2015-06-20 (×2): 0.4 mg via ORAL
  Filled 2015-06-19 (×2): qty 1

## 2015-06-19 MED ORDER — RISPERIDONE 0.5 MG PO TABS
0.5000 mg | ORAL_TABLET | Freq: Every day | ORAL | Status: DC
  Administered 2015-06-19: 0.5 mg via ORAL
  Filled 2015-06-19: qty 1

## 2015-06-19 MED ORDER — LIDOCAINE HCL 2 % EX GEL
1.0000 "application " | Freq: Once | CUTANEOUS | Status: AC
  Administered 2015-06-19: 1 via URETHRAL
  Filled 2015-06-19: qty 5

## 2015-06-19 NOTE — Progress Notes (Addendum)
STROKE TEAM PROGRESS NOTE  Parker Hunter is an 81 y.o. male history diabetes mellitus, TIA, hypertension and coronary artery disease, complaining of increasing gait instability and recurrent falls. Patient was seen in the ED at [facility]. MRI of his brain was obtained which showed acute ischemic infarctions involving the left frontoparietal region with a watershed MCA-ACA distribution. Remote infarcts involving right frontal lobe and right basal ganglia and corona radiata as well as right cerebellum were noted. MRA showed no large proximal arterial occlusion intracranially. Focal high-grade is still left M1 stenosis was noted. He had no changes in speech. He's also had no visual changes. No focal extremity weakness has been experienced. Gait difficulty has been progressive for at least several months. LSN: Unclear tPA Given: No: Unclear when last known well SUBJECTIVE (INTERVAL HISTORY) No family is at the bedside.  Overall he feels his condition is stable. He reports a long history of falling. He denies any focal symptoms of stroke   OBJECTIVE Temp:  [97.5 F (36.4 C)-98.6 F (37 C)] 97.9 F (36.6 C) (10/06 1424) Pulse Rate:  [55-87] 87 (10/06 1424) Cardiac Rhythm:  [-] Normal sinus rhythm (10/06 0700) Resp:  [17-20] 17 (10/06 1424) BP: (134-167)/(55-71) 134/71 mmHg (10/06 1424) SpO2:  [97 %-100 %] 99 % (10/06 1424)  CBC:   Recent Labs Lab 06/17/15 1927 06/18/15 0248  WBC 8.0 7.4  NEUTROABS 4.8  --   HGB 13.2 13.0  HCT 37.6* 37.2*  MCV 85.8 85.9  PLT 183 147*    Basic Metabolic Panel:   Recent Labs Lab 06/18/15 0248 06/19/15 0657  NA 142 142  K 2.9* 3.5  CL 102 106  CO2 29 28  GLUCOSE 166* 128*  BUN 25* 22*  CREATININE 1.54* 1.26*  CALCIUM 8.6* 8.6*    Lipid Panel:     Component Value Date/Time   CHOL 145 06/18/2015 0248   TRIG 121 06/18/2015 0248   HDL 29* 06/18/2015 0248   CHOLHDL 5.0 06/18/2015 0248   VLDL 24 06/18/2015 0248   LDLCALC 92 06/18/2015 0248    HgbA1c:  Lab Results  Component Value Date   HGBA1C 7.0* 06/18/2015   Urine Drug Screen: No results found for: LABOPIA, COCAINSCRNUR, LABBENZ, AMPHETMU, THCU, LABBARB    IMAGING  Dg Chest 2 View 06/17/2015    No active cardiopulmonary disease.     Dg Ribs Unilateral Right 06/18/2015   No displaced rib fracture seen.     Mr Brain Wo Contrast 06/17/2015   1. Motion degraded study. 2. Patchy acute ischemic infarcts involving the cortical gray matter and deep/periventricular white matter of the left parietal lobe extending anteriorly into the left frontal lobe as above. 3. No other definite acute intracranial process. 4. Remote infarcts involving the anterior right frontal lobe, right basal ganglia/corona radiata, and possibly right cerebellar hemisphere. 5. Advanced age-related cerebral atrophy with moderate chronic microvascular ischemic disease.     Mr Maxine Glenn Head/brain Wo Cm 06/18/2015    1. No large or proximal arterial branch occlusion within the intracranial circulation. 2. Focal high-grade severe stenosis within the distal left M1 segment measuring 2 mm in length. 3. Additional fairly advanced multi focal atheromatous irregularity throughout the intracranial circulation as above.     Carotid Doppler  There is 1-39% bilateral ICA stenosis. Vertebral artery flow is antegrade.     PHYSICAL EXAM Pleasant frail elderly Caucasian male not in distress. . Afebrile. Head is nontraumatic. Neck is supple without bruit.    Cardiac exam no murmur or  gallop. Lungs are clear to auscultation. Distal pulses are well felt. Neurological Exam ;  Awake  Alert oriented x 3.Diminished attention and recall. Normal speech and language.eye movements full without nystagmus.fundi were not visualized. Vision acuity and fields appear normal. Hearing is normal. Palatal movements are normal. Face symmetric. Tongue midline. Normal strength, tone, reflexes and coordination. Normal sensation. Gait  deferred. ASSESSMENT/PLAN [name] is a 81 y.o. male with history of diabetes mellitus, TIA, hypertension and coronary artery disease presenting with recurrent falls and gait instability for several months. MRI showed acute infarcts. He did not receive IV t-PA due to unknown last known well.   Stroke:  Patchy left parietal and frontal coritcal infarcts felt to be either due to L M2 atherosclerosis vs embolic secondary to unknown source  Resultant no new neuro deficits  MRI  Patchy L parietal and frontal cortical infarcts; old R frontal, R BG and ? R cerebellar infarcts  MRA  Focal L M1 stenosis; multifocal atheromatous irregularity throughout  Carotid Doppler  No significant stenosis   2D Echo Left ventricle: The cavity size was normal. There was mild concentric hypertrophy. Systolic function was normal. The estimated ejection fraction was in the range of 55% to 60    LDL 92  HgbA1c 7.0  Lovenox 40 mg sq daily for VTE prophylaxis Diet heart healthy/carb modified Room service appropriate?: Yes; Fluid consistency:: Thin  aspirin 325 mg orally every day prior to admission, now on aspirin 325 mg orally every day. Given large vessel intracranial atherosclerosis, patient should be treated with aspirin 81 mg and clopidogrel 75 mg orally every day x 3 months for secondary stroke prevention. After 3 months, change to plavix alone. Long-term dual antiplatelets are contraindicated due to risk for intracerebral hemorrhage.   Patient counseled to be compliant with his antithrombotic medications  Ongoing aggressive stroke risk factor management  While he possibly could have an embolic source of his stroke, his hx of frequent, hard falls precludes further embolic evaluation  Therapy recommendations:  SNF  Disposition:  pending (lives at home with wife, married x 62 years)  Hypertension  Stable  Permissive hypertension (OK if < 220/120) but gradually normalize in 5-7  days  Hyperlipidemia  Home meds:  lipitor 40, resumed in hospital  LDL 92, goal < 70  Continue statin at discharge  Diabetes type II  HgbA1c 7.0 , goal < 7.0  Other Stroke Risk Factors  Advanced age  Family hx stroke (father)  Coronary artery disease - stent  Other Active Problems  Chronic kidney disease stay III  Hospital day # 2  Dashawna Delbridge  [facility] See Amion for Pager information 06/19/2015 4:03 PM   I have personally examined this patient, reviewed notes, independently viewed imaging studies, participated in medical decision making and plan of care. I have made any additions or clarifications directly to the above note.  He has presented with chronic recurrent falls and gait difficulties will order other focal findings of her stroke but MRI scan shows an embolic left frontal and parietal infarct. Patient appears not to be a good long-term candidate for anticoagulation given history of innumerable falls hence we will not pursue workup for atrial fibrillation. He however remains at risk for recurrent strokes, TIAs or neurological worsening. Recommend dual antiplatelets therapy for 3 months for intracranial stenosis followed by single agent later. Stroke team will sign off. Kindly call for questions. Delia Heady, MD Medical Director [facility] Pager: [phone] 06/19/2015 4:03 PM  To contact Stroke Continuity provider, please refer to [email or web address] After hours, contact General Neurology

## 2015-06-19 NOTE — Clinical Social Work Note (Signed)
BSW intern contacted both Parker Hunter and Parker Hunter by phone in reference to discharge planning. BSW intern left a voicemail for Parker Hunter to return phone call to CSW.  BSW intern remains available as needed.  Jarryn Altland-BSW Intern

## 2015-06-19 NOTE — Progress Notes (Signed)
Occupational Therapy Evaluation Patient Details Name: Parker Hunter MRN: [id number] DOB: December 29, 1926 Today's Date: 06/19/2015    History of Present Illness Parker Hunter is an 81 y.o male who presented with recurrent falls and worsening gait instability (x2 weeks).  MRI showed acute ischemic infarct of L frontotemporal region as well as several remote infarcts. Hx of CAD, DMII, CKD, and HTN.   Clinical Impression   Pt admitted with the above diagnoses and presents with below problem list. Pt will benefit from continued acute OT to address the below listed deficits and maximize independence with BADLs prior to d/c to next venue. PTA pt was mod I with ADLs. Pt is currently mod A for LB ADLs and functional transfers; min A for functional mobility short distance. Pt with episode of bowel incontinence during session. Session details below. Pt will need skilled level of care with additional rehab at d/c. OT to continue to follow acutely.      Follow Up Recommendations  SNF    Equipment Recommendations  Other (comment) (defer to next venue)    Recommendations for Other Services       Precautions / Restrictions Precautions Precautions: Fall Restrictions Weight Bearing Restrictions: No      Mobility Bed Mobility Overal bed mobility: Needs Assistance Bed Mobility: Supine to Sit;Sit to Supine     Supine to sit: Mod assist Sit to supine: Mod assist   General bed mobility comments: mod A to powerup trunk and to advance BLE back onto bed. Verbal and tactile cues for sequencing.  Transfers Overall transfer level: Needs assistance Equipment used: Rolling walker (2 wheeled) Transfers: Sit to/from Stand Sit to Stand: Mod assist Stand pivot transfers: Min assist       General transfer comment: mod A for posterior LOB from [facility]; posterior lean    Balance Overall balance assessment: Needs assistance;History of Falls Sitting-balance support: No upper extremity supported;Feet  supported Sitting balance-Leahy Scale: Fair Sitting balance - Comments: sat on BSC   Standing balance support: Bilateral upper extremity supported;During functional activity Standing balance-Leahy Scale: Poor Standing balance comment: pt reports falling in bathroom at home and hitting right hip on tub wall. Pt reports this was 3 weeks ago.                             ADL Overall ADL's : Needs assistance/impaired Eating/Feeding: Set up;Sitting;Supervision/ safety   Grooming: Oral care;Brushing hair;Supervision/safety;Set up;Sitting Grooming Details (indicate cue type and reason): superivision for safety, task perseveration noted, increased spillage during oral care Upper Body Bathing: Min guard;Sitting   Lower Body Bathing: Moderate assistance;Sit to/from stand   Upper Body Dressing : Min guard;Sitting   Lower Body Dressing: Moderate assistance;Sit to/from stand   Toilet Transfer: Minimal assistance;Moderate assistance;Ambulation;BSC;RW Toilet Transfer Details (indicate cue type and reason): performed multiple times mostly at min A; 1x mod A for posterior LOB Toileting- Clothing Manipulation and Hygiene: Moderate assistance;Sit to/from stand Toileting - Clothing Manipulation Details (indicate cue type and reason): pt stood with rw while therapist completed hygenie in stnading position Tub/ Shower Transfer: Moderate assistance;Ambulation   Functional mobility during ADLs: Minimal assistance General ADL Comments: Pt completed grooming task EOB as detailed above. Pt noted to have had episode of bowel incontinence once he was in standing position. Pt stated he did not realize he had had a bowel movement and that is not baseline. Pt's spouse present at this point and able to confirm. Pt with decreased cognition and  decreased balance.      Vision Additional Comments: Able to read therapist name badge and clock on the wall. Denies bluriness or diplopia.    Perception      Praxis      Pertinent Vitals/Pain Pain Assessment: No/denies pain     Hand Dominance Right   Extremity/Trunk Assessment Upper Extremity Assessment Upper Extremity Assessment: Overall WFL for tasks assessed;Difficult to assess due to impaired cognition   Lower Extremity Assessment Lower Extremity Assessment: Defer to PT evaluation       Communication Communication Communication: No difficulties   Cognition Arousal/Alertness: Awake/alert Behavior During Therapy: WFL for tasks assessed/performed Overall Cognitive Status: Impaired/Different from baseline Area of Impairment: Orientation;Following commands;Safety/judgement;Problem solving Orientation Level: Situation;Time   Memory: Decreased short-term memory Following Commands: Follows multi-step commands inconsistently Safety/Judgement: Decreased awareness of safety;Decreased awareness of deficits   Problem Solving: Slow processing;Decreased initiation;Difficulty sequencing;Requires verbal cues;Requires tactile cues General Comments: Pt able to correctly state "hospital." Could not state reason he is here or any part of date. At times seems aware of deficit indicating that not knowing the date is not normal for him. Stated several times that his balance is not good during session. Stated "I think I'm doing alright."    General Comments       Exercises       Shoulder Instructions      Home Living Family/patient expects to be discharged to:: Skilled nursing facility Living Arrangements: Spouse/significant other Available Help at Discharge: Family Type of Home: House                       Home Equipment: Dan Humphreys - 2 wheels   Additional Comments: Pt poor historian, some information taken from notes      Prior Functioning/Environment Level of Independence: Needs assistance  Gait / Transfers Assistance Needed: Uses RW for mobility  ADL's / Homemaking Assistance Needed: States he performs bath/dress on his own -  no famly to confirm        OT Diagnosis: Cognitive deficits;Other (comment) (decreased balance)   OT Problem List: Impaired balance (sitting and/or standing);Decreased cognition;Decreased safety awareness;Decreased knowledge of use of DME or AE;Decreased knowledge of precautions   OT Treatment/Interventions: Self-care/ADL training;Therapeutic exercise;Neuromuscular education;DME and/or AE instruction;Therapeutic activities;Cognitive remediation/compensation;Patient/family education;Balance training    OT Goals(Current goals can be found in the care plan section) Acute Rehab OT Goals Patient Stated Goal: none stated OT Goal Formulation: With patient/family Time For Goal Achievement: 06/26/15 Potential to Achieve Goals: Good ADL Goals Pt Will Perform Grooming: with modified independence;standing Pt Will Transfer to Toilet: ambulating;bedside commode;with supervision Pt Will Perform Toileting - Clothing Manipulation and hygiene: with min guard assist;sit to/from stand Pt Will Perform Tub/Shower Transfer: Shower transfer;with min guard assist;ambulating;3 in 1 Additional ADL Goal #1: Pt will perform bed mobility at supervision level to prepare for OOB ADLs.  Additional ADL Goal #2: Pt will complete follow multiple-step commands during ADL task with no verbal cueing.   OT Frequency: Min 2X/week   Barriers to D/C:            Co-evaluation              End of Session Equipment Utilized During Treatment: Gait belt;Rolling walker Nurse Communication: Other (comment) (bowel incontinence)  Activity Tolerance: Patient tolerated treatment well Patient left: in bed;with call bell/phone within reach;with bed alarm set;with family/visitor present   Time: 1610-9604 OT Time Calculation (min): 34 min Charges:  OT General Charges $OT Visit: 1 Procedure  OT Evaluation $Initial OT Evaluation Tier I: 1 Procedure OT Treatments $Self Care/Home Management : 8-22 mins G-Codes:    Pilar Grammes 2015-07-12, 3:16 PM

## 2015-06-19 NOTE — Progress Notes (Signed)
[facility] PROGRESS NOTE  Aceson Labell WUX:[id number] DOB: 1927-05-20 DOA: 06/17/2015 PCP: No primary care provider on file.  HPI Parker Hunter is an 81 y.o male who presented to the ED at [facility] with recurrent falls and worsening gait instability (x2 weeks). He fell 4x in 24 hours, denies dizziness or LOC. MRI showed acute ischemic infarct of L frontotemporal region as well as several remote infarcts. MRA showed no proximal artery occlusion. Patient expressed no changes in speech/vision and no focal extremity weakness. PMH includes: DM, TIA, HTN, and CAD (s/p stenting). Patient was admitted for further workup of acute CVA.   Subjective: Today the patient is sleepy (given Ativan for agitation in PM). Briefly aroused with stimulation. No complaints expressed.   Assessment/Plan: Principal Problem:  Acute CVA: Patient presented with worsening gait instability/recurrent falls. MRI positive for acute infarct left frontal infarct. MRA revealed significant vascular stenosis. Carotid doppler without significant stenosis, echo without embolic source. LDL 92 and HgbA1c 7.0.  No new complaints from patient today. Per neurology recommendations, continue with 81 mg Aspirin + Plavix 75 mg QD x3 months then transition to Plavix alone. PT/OT pending.   Active Problems:  Hyperlipidemia: Managed at home with Lipitor 40 mg. LDL currently 92 (post CVA goal < 70). Per neurology, continue at-home dose.   Diabetes, type 2: Managed at home with Glipizide and Actos. Discontinued Actos and initiated sliding scale insulin and carb modified diet on admission. CBGs remain stable. A1c 7.0. Continue both and monitor.  Hypertension: Managed at home with Losartan-HCTZ. Discontinued HCTZ and continued Losartan on admission for permissive HTN. Stable, highest 167/59. Continue permissive HTN (appropriate until < 220/120) and monitor.  CKD, stage 3: BUN/Cr 22/1.26 today, continues to improve with IVF. Continue IVF and monitor.    Hypokalemia: Etiology unknown. Improved today (3.5) with repletion. Continue repletion and monitor.    Coronary Artery Disease: S/P stent, patient previously on Aspirin 325 QD. Transitioned to 81 mg Aspirin per stroke team. Continues to deny CP, but will continue to monitor.    Code Status: DNR Family Communication: None at bedside Disposition Plan: Pending evaluation, home or facility when stable  DVT Prophylaxis: Lovenox   Consultants:  Neurology   Procedures:  Echocardiogram  Antibiotics:  None    Objective: Filed Vitals:   06/19/15 0532  BP: 150/55  Pulse: 55  Temp: 97.5 F (36.4 C)  Resp: 20    Intake/Output Summary (Last 24 hours) at 06/19/15 1026 Last data filed at 06/19/15 0013  Gross per 24 hour  Intake      0 ml  Output    308 ml  Net   -308 ml   Filed Weights   06/18/15 0203  Weight: 69.4 kg (153 lb)    Exam:   General:  Elderly-appearing man, sleeping in bed  Extremities: L pinky finger s/p traumatic amputation  Cardiovascular: RRR, no m/r/g. No peripheral edema   Respiratory: CTA b/l, no wheeze or crackles   Abdomen: Soft, non-tender, non-distended  Neuro: Sleeping, but briefly awake with stimulation. Appropriate movement of upper/lower extremities  Data Reviewed: Basic Metabolic Panel:  Recent Labs Lab 06/17/15 1927 06/18/15 0248 06/19/15 0657  NA 143 142 142  K 3.1* 2.9* 3.5  CL 106 102 106  CO2 GLUCOSE 181* 166* 128*  BUN 33* 25* 22*  CREATININE 1.57* 1.54* 1.26*  CALCIUM 8.8* 8.6* 8.6*   Liver Function Tests:  Recent Labs Lab 06/18/15 0248  AST 40  ALT 17  ALKPHOS  73  BILITOT 0.9  PROT 6.2*  ALBUMIN 3.3*   No results for input(s): LIPASE, AMYLASE in the last 168 hours. No results for input(s): AMMONIA in the last 168 hours. CBC:  Recent Labs Lab 06/17/15 1927 06/18/15 0248  WBC 8.0 7.4  NEUTROABS 4.8  --   HGB 13.2 13.0  HCT 37.6* 37.2*  MCV 85.8 85.9  PLT 183 147*   Cardiac  Enzymes:  Recent Labs Lab 06/18/15 1216  CKTOTAL 560*  TROPONINI <0.03   BNP (last 3 results) No results for input(s): BNP in the last 8760 hours.  ProBNP (last 3 results) No results for input(s): PROBNP in the last 8760 hours.  CBG:  Recent Labs Lab 06/18/15 0649 06/18/15 1158 06/18/15 1640 06/18/15 2317 06/19/15 0644  GLUCAP 161* 184* 107* 133* 108*    No results found for this or any previous visit (from the past 240 hour(s)).   Studies: Dg Chest 2 View  06/17/2015   CLINICAL DATA:  Falls.  EXAM: CHEST  2 VIEW  COMPARISON:  May 14, 2015.  FINDINGS: The heart size and mediastinal contours are within normal limits. Both lungs are clear. No pneumothorax or pleural effusion is noted. The visualized skeletal structures are unremarkable.  IMPRESSION: No active cardiopulmonary disease.   Electronically Signed   By: Lupita Raider, M.D.   On: 06/17/2015 20:37   Dg Ribs Unilateral Right  06/18/2015   CLINICAL DATA:  Status post recent fall onto bathtub. Right anterior lower rib pain. Initial encounter.  EXAM: RIGHT RIBS - 2 VIEW  COMPARISON:  Chest radiograph performed 05/14/2015  FINDINGS: No displaced rib fractures are seen.  The visualized portions of the lungs are clear. The cardiomediastinal silhouette is incompletely characterized, but appears grossly unremarkable.  IMPRESSION: No displaced rib fracture seen.   Electronically Signed   By: Roanna Raider M.D.   On: 06/18/2015 03:40   Mr Brain Wo Contrast  06/17/2015   CLINICAL DATA:  Initial evaluation for increase falls for past 2 weeks. Loss of balance.  EXAM: MRI HEAD WITHOUT CONTRAST  TECHNIQUE: Multiplanar, multiecho pulse sequences of the brain and surrounding structures were obtained without intravenous contrast.  COMPARISON:  Prior CT from 09/21/2011.  FINDINGS: Study is markedly degraded by motion artifact.  Diffuse prominence of the CSF containing spaces is compatible with generalized age-related cerebral atrophy.  Extensive patchy and confluent T2/FLAIR hyperintensity within the periventricular deep white matter both cerebral hemispheres noted, most likely related to moderate chronic small vessel ischemic disease. Encephalomalacia within the parasagittal anterior right frontal lobe most likely related to remote right ACA territory infarct. Remote lacunar infarct present within the right basal ganglia extending into the right corona radiata. Additional possible small remote infarcts within the right cerebellar hemisphere.  Diffusion-weighted sequences limited by motion artifact. There is patchy cortical infarct within the high left parietal lobe, best appreciated on coronal DWI sequence (series 5, image 11). Additionally, there is patchy infarcts extending superiorly along the deep/periventricular white matter of the left centrum semi ovale. Probable a few small foci of patchy cortical infarct more anteriorly within the left frontal lobe (series 702, image 21). Correlation with ADC map markedly limited on this study due to motion artifact. Distribution of these infarcts suggestive of possible underlying watershed etiology.  Intravascular flow voids grossly maintained. No definite acute or chronic intracranial hemorrhage.  No definite mass lesion. No midline shift or mass effect. Ventricular prominence related to global parenchymal volume loss present without hydrocephalus. No extra-axial fluid  collection.  Craniocervical junction within normal limits. Pituitary gland grossly unremarkable. No definite acute abnormality about the orbits. Probable lens extraction on the right.  Scattered mucosal thickening within the ethmoidal air cells and left maxillary sinus. No definite mastoid effusion.  Bone marrow signal intensity within normal limits. Scalp soft tissues unremarkable.  IMPRESSION: 1. Motion degraded study. 2. Patchy acute ischemic infarcts involving the cortical gray matter and deep/periventricular white matter of the left  parietal lobe extending anteriorly into the left frontal lobe as above. 3. No other definite acute intracranial process. 4. Remote infarcts involving the anterior right frontal lobe, right basal ganglia/corona radiata, and possibly right cerebellar hemisphere. 5. Advanced age-related cerebral atrophy with moderate chronic microvascular ischemic disease.   Electronically Signed   By: Rise Mu M.D.   On: 06/17/2015 22:10   Mr Maxine Glenn Head/brain Wo Cm  06/18/2015   CLINICAL DATA:  Initial evaluation for stroke.  EXAM: MRA HEAD WITHOUT CONTRAST  TECHNIQUE: Angiographic images of the Circle of Willis were obtained using MRA technique without intravenous contrast.  COMPARISON:  Prior MRI 06/17/2015.  FINDINGS: ANTERIOR CIRCULATION:  Visualized distal cervical segments of the internal carotid arteries are patent with antegrade flow. The petrous segments are well opacified. Scattered atheromatous irregularity present within mean cavernous/ supraclinoid segments bilaterally without hemodynamically significant stenosis. Mild to moderate narrowing of the supra clinoid segments bilaterally.  Right A1 segment is hypoplastic and irregular. Left A1 segment is well opacified. Anterior communicating artery normal. Anterior cerebral arteries grossly opacified to their distal aspects. Probable multi focal atheromatous irregularity within the anterior cerebral arteries bilaterally.  Right M1 segment patent to the right MCA bifurcation it without focal stenosis or occlusion. Distal right MCA branches well opacified but demonstrate multi focal atheromatous irregularity.  Left M1 segment patent proximally. There is a focal short segment high-grade stenosis within the distal left M1 segment measuring approximately 2 mm in length (series 304, image 10). Left MCA branches are opacified distally.  POSTERIOR CIRCULATION:  Left vertebral artery is dominant and patent to the vertebrobasilar junction. Multi focal atheromatous  irregularity with moderate narrowing present within the distal left V4 segment. Right vertebral artery and demonstrates multi focal atheromatous irregularity but is patent to the vertebrobasilar junction as well. Posterior inferior cerebral arteries not well evaluated on this exam. Basilar patent with multi focal atheromatous irregularity and mild stenoses. Superior cerebellar arteries opacified proximally. Left posterior cerebral artery arises from the basilar artery. Left P1 segment widely patent. Left P2 segment opacified to its distal aspect. Distal branch atheromatous irregularity within the distal left PCA. There is fetal origin of the right PCA with a in widely patent right posterior communicating artery. Right PCA demonstrates multi focal atheromatous regularity and is somewhat attenuated distally.  No aneurysm or vascular malformation.  IMPRESSION: 1. No large or proximal arterial branch occlusion within the intracranial circulation. 2. Focal high-grade severe stenosis within the distal left M1 segment measuring 2 mm in length. 3. Additional fairly advanced multi focal atheromatous irregularity throughout the intracranial circulation as above.   Electronically Signed   By: Rise Mu M.D.   On: 06/18/2015 04:51    Scheduled Meds: . aspirin  81 mg Oral Daily  . atorvastatin  40 mg Oral Daily  . clopidogrel  75 mg Oral Daily  . enoxaparin (LOVENOX) injection  40 mg Subcutaneous Q24H  . glipiZIDE  10 mg Oral Q breakfast  . insulin aspart  0-9 Units Subcutaneous TID WC  . LORazepam  0.5 mg  Intravenous Once  . losartan  100 mg Oral Daily  . pantoprazole  40 mg Oral Daily  . pioglitazone  45 mg Oral Daily    Principal Problem:   Stroke [facility]) Active Problems:   CAD (coronary artery disease) s/p stent   Hyperlipidemia   Diabetes mellitus type 2 with neurological manifestations (HCC)   Intracranial carotid stenosis       Charlton Boule, Student-PA  [facility] If  7PM-7AM, please contact night-coverage at [email or web address], password [email or web address] 06/19/2015, 10:26 AM  LOS: 2 days

## 2015-06-19 NOTE — Progress Notes (Signed)
Patient with urinary retention. Patient's family states this is an ongoing problem, has been seen by a urologist regarding. Unable to place in and out cath without meeting resistance, MD made aware, per MD, place coude catheter.

## 2015-06-19 NOTE — Evaluation (Signed)
Physical Therapy Evaluation Patient Details Name: Parker Hunter MRN: [id number] DOB: 28-May-1927 Today's Date: 06/19/2015   History of Present Illness  Parker Hunter is an 81 y.o male who presented with recurrent falls and worsening gait instability (x2 weeks).  MRI showed acute ischemic infarct of L frontotemporal region as well as several remote infarcts. Hx of CAD, DMII, CKD, and HTN.  Clinical Impression  Pt admitted with the above diagnosis. Pt currently with functional limitations due to the deficits listed below (see PT Problem List). Demonstrates significant balance deficits, requiring mod assist to safely transfer. Difficulty with sequencing motor patterns with gait. Will greatly benefit from SNF for continued rehab prior to returning home. Pt will benefit from skilled PT to increase their independence and safety with mobility to allow discharge to the venue listed below.       Follow Up Recommendations SNF;Supervision/Assistance - 24 hour    Equipment Recommendations  None recommended by PT    Recommendations for Other Services       Precautions / Restrictions Precautions Precautions: Fall Restrictions Weight Bearing Restrictions: No      Mobility  Bed Mobility Overal bed mobility: Needs Assistance Bed Mobility: Supine to Sit     Supine to sit: Mod assist     General bed mobility comments: Mod assist for patient to pull through PTs hand and for LE sequencing out of bed.  Transfers Overall transfer level: Needs assistance Equipment used: Rolling walker (2 wheeled) Transfers: Sit to/from [employer] Sit to Stand: Mod assist Stand pivot transfers: Min assist       General transfer comment: Mod assist for boost and balance from bed and BSC. Leans heavily towards posterior and requires assist to correct to prevent fall. Min assist for walker control with pivot to [facility]. Max VC to initiate sequencing.  Ambulation/Gait Ambulation/Gait assistance: Min  assist Ambulation Distance (Feet): 5 Feet Assistive device: Rolling walker (2 wheeled) Gait Pattern/deviations: Step-through pattern;Decreased stride length;Shuffle;Drifts right/left;Trunk flexed Gait velocity: slow Gait velocity interpretation: <1.8 ft/sec, indicative of risk for recurrent falls General Gait Details: Difficulty sequencing. Requring min assist for walker control and max VC for stepping and turning directions. No buckling noted however neglects LLE while stepping with RLE.  Stairs            Wheelchair Mobility    Modified Rankin (Stroke Patients Only) Modified Rankin (Stroke Patients Only) Pre-Morbid Rankin Score: Moderate disability (unconfirmed by family  - this is per pt report) Modified Rankin: Moderately severe disability     Balance Overall balance assessment: Needs assistance;History of Falls Sitting-balance support: No upper extremity supported;Feet supported Sitting balance-Leahy Scale: Fair     Standing balance support: Bilateral upper extremity supported Standing balance-Leahy Scale: Poor                               Pertinent Vitals/Pain Pain Assessment: No/denies pain    Home Living Family/patient expects to be discharged to:: Skilled nursing facility Living Arrangements: Spouse/significant other Available Help at Discharge: Family Type of Home: House         Home Equipment: Dan Humphreys - 2 wheels Additional Comments: Pt poor historian, some information taken from notes    Prior Function Level of Independence: Needs assistance   Gait / Transfers Assistance Needed: Uses RW for mobility  ADL's / Homemaking Assistance Needed: States he performs bath/dress on his own - no famly to confirm  Hand Dominance   Dominant Hand: Right    Extremity/Trunk Assessment   Upper Extremity Assessment: Defer to OT evaluation           Lower Extremity Assessment: Difficult to assess due to impaired cognition          Communication   Communication: No difficulties  Cognition Arousal/Alertness: Awake/alert Behavior During Therapy: WFL for tasks assessed/performed Overall Cognitive Status: Impaired/Different from baseline Area of Impairment: Orientation;Following commands;Problem solving Orientation Level: Disoriented to;Place;Situation   Memory: Decreased short-term memory Following Commands: Follows multi-step commands inconsistently     Problem Solving: Slow processing;Decreased initiation;Difficulty sequencing;Requires verbal cues      General Comments      Exercises        Assessment/Plan    PT Assessment Patient needs continued PT services  PT Diagnosis Difficulty walking;Abnormality of gait;Generalized weakness;Altered mental status   PT Problem List Decreased strength;Decreased activity tolerance;Decreased balance;Decreased mobility;Decreased coordination;Decreased cognition;Decreased knowledge of use of DME;Decreased safety awareness  PT Treatment Interventions Gait training;DME instruction;Functional mobility training;Therapeutic activities;Therapeutic exercise;Balance training;Neuromuscular re-education;Patient/family education;Cognitive remediation   PT Goals (Current goals can be found in the Care Plan section) Acute Rehab PT Goals Patient Stated Goal: none stated PT Goal Formulation: With patient/family Time For Goal Achievement: 07/03/15 Potential to Achieve Goals: Good    Frequency Min 4X/week   Barriers to discharge        Co-evaluation               End of Session Equipment Utilized During Treatment: Gait belt Activity Tolerance: Patient tolerated treatment well Patient left: in chair;with call bell/phone within reach;with chair alarm set;with family/visitor present Nurse Communication: Mobility status;Precautions         Time: 8119-1478 PT Time Calculation (min) (ACUTE ONLY): 16 min   Charges:   PT Evaluation $Initial PT Evaluation Tier I: 1  Procedure     PT G CodesBerton Mount 06/19/2015, 1:37 PM Sunday Spillers [place], [place] [phone]

## 2015-06-20 LAB — URINALYSIS, ROUTINE W REFLEX MICROSCOPIC
Bilirubin Urine: NEGATIVE
Glucose, UA: NEGATIVE mg/dL
HGB URINE DIPSTICK: NEGATIVE
Ketones, ur: 15 mg/dL — AB
Leukocytes, UA: NEGATIVE
NITRITE: NEGATIVE
PROTEIN: NEGATIVE mg/dL
SPECIFIC GRAVITY, URINE: 1.017 (ref 1.005–1.030)
UROBILINOGEN UA: 1 mg/dL (ref 0.0–1.0)
pH: 5.5 (ref 5.0–8.0)

## 2015-06-20 LAB — GLUCOSE, CAPILLARY
Glucose-Capillary: 202 mg/dL — ABNORMAL HIGH (ref 65–99)
Glucose-Capillary: 212 mg/dL — ABNORMAL HIGH (ref 65–99)

## 2015-06-20 LAB — CK: CK TOTAL: 108 U/L (ref 49–397)

## 2015-06-20 MED ORDER — BENZTROPINE MESYLATE 1 MG PO TABS: 1.0000 mg | ORAL_TABLET | Freq: Every day | ORAL | Status: DC

## 2015-06-20 MED ORDER — ASPIRIN 81 MG PO TABS: 81.0000 mg | ORAL_TABLET | Freq: Every day | ORAL | Status: DC

## 2015-06-20 MED ORDER — RISPERIDONE 0.5 MG PO TABS: 0.5000 mg | ORAL_TABLET | Freq: Every day | ORAL | Status: DC

## 2015-06-20 MED ORDER — TAMSULOSIN HCL 0.4 MG PO CAPS: 0.4000 mg | ORAL_CAPSULE | Freq: Every day | ORAL | Status: DC

## 2015-06-20 MED ORDER — HYDROCODONE-ACETAMINOPHEN 5-325 MG PO TABS: 1.0000 | ORAL_TABLET | Freq: Four times a day (QID) | ORAL | Status: DC | PRN

## 2015-06-20 MED ORDER — CLOPIDOGREL BISULFATE 75 MG PO TABS: 75.0000 mg | ORAL_TABLET | Freq: Every day | ORAL | Status: DC

## 2015-06-20 NOTE — Discharge Summary (Addendum)
PATIENT DETAILS Name: Parker Hunter Age: 81 y.o. Sex: male Date of Birth: [birth year] MRN: [id number]. Admitting Physician: Lorretta Harp, MD PCP:No primary care provider on file.  Admit Date: 06/17/2015 Discharge date: 06/20/2015  Recommendations for Outpatient Follow-up:  1. Please check cholesterol panel and A1c in 3 months 2. Please check bmet in 2 weeks. 3. Aspirin/Plavix for 3 months, after that Plavix alone  4. being discharged with Foley catheter-recommend voiding trial in 2 weeks-please arrange for urology follow-up  PRIMARY DISCHARGE DIAGNOSIS:  Principal Problem:   Stroke [facility]) Active Problems:   CAD (coronary artery disease) s/p stent   Hyperlipidemia   Diabetes mellitus type 2 with neurological manifestations (HCC)   Intracranial carotid stenosis      PAST MEDICAL HISTORY: Past Medical History  Diagnosis Date  . Diabetes mellitus without complication (HCC)   . TIA (transient ischemic attack)   . Neuropathy (HCC)   . Status post dilation of esophageal narrowing   . Hypertension   . Stented coronary artery     DISCHARGE MEDICATIONS: Current Discharge Medication List    START taking these medications   Details  benztropine (COGENTIN) 1 MG tablet Take 1 tablet (1 mg total) by mouth daily.    clopidogrel (PLAVIX) 75 MG tablet Take 1 tablet (75 mg total) by mouth daily. Aspirin/Plavix for 3 months, after that Plavix alone    risperiDONE (RISPERDAL) 0.5 MG tablet Take 1 tablet (0.5 mg total) by mouth at bedtime.    tamsulosin (FLOMAX) 0.4 MG CAPS capsule Take 1 capsule (0.4 mg total) by mouth daily. Qty: 30 capsule      CONTINUE these medications which have CHANGED   Details  aspirin 81 MG tablet Take 1 tablet (81 mg total) by mouth daily. Aspirin/Plavix for 3 months, after that Plavix alone    HYDROcodone-acetaminophen (NORCO/VICODIN) 5-325 MG tablet Take 1 tablet by mouth every 6 (six) hours as needed for moderate pain or severe pain. Qty: 15 tablet,  Refills: 0      CONTINUE these medications which have NOT CHANGED   Details  atorvastatin (LIPITOR) 40 MG tablet Take 40 mg by mouth daily.    esomeprazole (NEXIUM) 40 MG capsule Take 40 mg by mouth daily.    glipiZIDE (GLUCOTROL XL) 10 MG 24 hr tablet Take 10 mg by mouth daily.    pioglitazone (ACTOS) 45 MG tablet Take 45 mg by mouth daily.      STOP taking these medications     losartan-hydrochlorothiazide (HYZAAR) 100-25 MG per tablet         ALLERGIES:   Allergies  Allergen Reactions  . Ibuprofen Other (See Comments)    GI Bleeding    BRIEF HPI:  See H&P, Labs, Consult and Test reports for all details in brief, patient is a 81 y.o. male with history of CAD status post stenting, hypertension, chronic kidney disease stage III, diabetes mellitus type 2 who presented with frequent falls and difficulty walking. Further evaluation demonstrated acute CVA.  CONSULTATIONS:   neurology  PERTINENT RADIOLOGIC STUDIES: Dg Chest 2 View  06/17/2015   CLINICAL DATA:  Falls.  EXAM: CHEST  2 VIEW  COMPARISON:  May 14, 2015.  FINDINGS: The heart size and mediastinal contours are within normal limits. Both lungs are clear. No pneumothorax or pleural effusion is noted. The visualized skeletal structures are unremarkable.  IMPRESSION: No active cardiopulmonary disease.   Electronically Signed   By: Lupita Raider, M.D.   On: 06/17/2015 20:37   Dg Ribs  Unilateral Right  06/18/2015   CLINICAL DATA:  Status post recent fall onto bathtub. Right anterior lower rib pain. Initial encounter.  EXAM: RIGHT RIBS - 2 VIEW  COMPARISON:  Chest radiograph performed 05/14/2015  FINDINGS: No displaced rib fractures are seen.  The visualized portions of the lungs are clear. The cardiomediastinal silhouette is incompletely characterized, but appears grossly unremarkable.  IMPRESSION: No displaced rib fracture seen.   Electronically Signed   By: Roanna Raider M.D.   On: 06/18/2015 03:40   Mr Brain Wo  Contrast  06/17/2015   CLINICAL DATA:  Initial evaluation for increase falls for past 2 weeks. Loss of balance.  EXAM: MRI HEAD WITHOUT CONTRAST  TECHNIQUE: Multiplanar, multiecho pulse sequences of the brain and surrounding structures were obtained without intravenous contrast.  COMPARISON:  Prior CT from 09/21/2011.  FINDINGS: Study is markedly degraded by motion artifact.  Diffuse prominence of the CSF containing spaces is compatible with generalized age-related cerebral atrophy. Extensive patchy and confluent T2/FLAIR hyperintensity within the periventricular deep white matter both cerebral hemispheres noted, most likely related to moderate chronic small vessel ischemic disease. Encephalomalacia within the parasagittal anterior right frontal lobe most likely related to remote right ACA territory infarct. Remote lacunar infarct present within the right basal ganglia extending into the right corona radiata. Additional possible small remote infarcts within the right cerebellar hemisphere.  Diffusion-weighted sequences limited by motion artifact. There is patchy cortical infarct within the high left parietal lobe, best appreciated on coronal DWI sequence (series 5, image 11). Additionally, there is patchy infarcts extending superiorly along the deep/periventricular white matter of the left centrum semi ovale. Probable a few small foci of patchy cortical infarct more anteriorly within the left frontal lobe (series 702, image 21). Correlation with ADC map markedly limited on this study due to motion artifact. Distribution of these infarcts suggestive of possible underlying watershed etiology.  Intravascular flow voids grossly maintained. No definite acute or chronic intracranial hemorrhage.  No definite mass lesion. No midline shift or mass effect. Ventricular prominence related to global parenchymal volume loss present without hydrocephalus. No extra-axial fluid collection.  Craniocervical junction within normal  limits. Pituitary gland grossly unremarkable. No definite acute abnormality about the orbits. Probable lens extraction on the right.  Scattered mucosal thickening within the ethmoidal air cells and left maxillary sinus. No definite mastoid effusion.  Bone marrow signal intensity within normal limits. Scalp soft tissues unremarkable.  IMPRESSION: 1. Motion degraded study. 2. Patchy acute ischemic infarcts involving the cortical gray matter and deep/periventricular white matter of the left parietal lobe extending anteriorly into the left frontal lobe as above. 3. No other definite acute intracranial process. 4. Remote infarcts involving the anterior right frontal lobe, right basal ganglia/corona radiata, and possibly right cerebellar hemisphere. 5. Advanced age-related cerebral atrophy with moderate chronic microvascular ischemic disease.   Electronically Signed   By: Rise Mu M.D.   On: 06/17/2015 22:10   Mr Maxine Glenn Head/brain Wo Cm  06/18/2015   CLINICAL DATA:  Initial evaluation for stroke.  EXAM: MRA HEAD WITHOUT CONTRAST  TECHNIQUE: Angiographic images of the Circle of Willis were obtained using MRA technique without intravenous contrast.  COMPARISON:  Prior MRI 06/17/2015.  FINDINGS: ANTERIOR CIRCULATION:  Visualized distal cervical segments of the internal carotid arteries are patent with antegrade flow. The petrous segments are well opacified. Scattered atheromatous irregularity present within mean cavernous/ supraclinoid segments bilaterally without hemodynamically significant stenosis. Mild to moderate narrowing of the supra clinoid segments bilaterally.  Right A1  segment is hypoplastic and irregular. Left A1 segment is well opacified. Anterior communicating artery normal. Anterior cerebral arteries grossly opacified to their distal aspects. Probable multi focal atheromatous irregularity within the anterior cerebral arteries bilaterally.  Right M1 segment patent to the right MCA bifurcation it  without focal stenosis or occlusion. Distal right MCA branches well opacified but demonstrate multi focal atheromatous irregularity.  Left M1 segment patent proximally. There is a focal short segment high-grade stenosis within the distal left M1 segment measuring approximately 2 mm in length (series 304, image 10). Left MCA branches are opacified distally.  POSTERIOR CIRCULATION:  Left vertebral artery is dominant and patent to the vertebrobasilar junction. Multi focal atheromatous irregularity with moderate narrowing present within the distal left V4 segment. Right vertebral artery and demonstrates multi focal atheromatous irregularity but is patent to the vertebrobasilar junction as well. Posterior inferior cerebral arteries not well evaluated on this exam. Basilar patent with multi focal atheromatous irregularity and mild stenoses. Superior cerebellar arteries opacified proximally. Left posterior cerebral artery arises from the basilar artery. Left P1 segment widely patent. Left P2 segment opacified to its distal aspect. Distal branch atheromatous irregularity within the distal left PCA. There is fetal origin of the right PCA with a in widely patent right posterior communicating artery. Right PCA demonstrates multi focal atheromatous regularity and is somewhat attenuated distally.  No aneurysm or vascular malformation.  IMPRESSION: 1. No large or proximal arterial branch occlusion within the intracranial circulation. 2. Focal high-grade severe stenosis within the distal left M1 segment measuring 2 mm in length. 3. Additional fairly advanced multi focal atheromatous irregularity throughout the intracranial circulation as above.   Electronically Signed   By: Rise Mu M.D.   On: 06/18/2015 04:51     PERTINENT LAB RESULTS: CBC:  Recent Labs  06/17/15 1927 06/18/15 0248  WBC 8.0 7.4  HGB 13.2 13.0  HCT 37.6* 37.2*  PLT 183 147*   CMET CMP     Component Value Date/Time   NA 142  06/19/2015 0657   K 3.5 06/19/2015 0657   CL 106 06/19/2015 0657   CO2 28 06/19/2015 0657   GLUCOSE 128* 06/19/2015 0657   BUN 22* 06/19/2015 0657   CREATININE 1.26* 06/19/2015 0657   CALCIUM 8.6* 06/19/2015 0657   PROT 6.2* 06/18/2015 0248   ALBUMIN 3.3* 06/18/2015 0248   AST 40 06/18/2015 0248   ALT 17 06/18/2015 0248   ALKPHOS 73 06/18/2015 0248   BILITOT 0.9 06/18/2015 0248   GFRNONAA 50* 06/19/2015 0657   GFRAA 58* 06/19/2015 0657    GFR Estimated Creatinine Clearance: 38 mL/min (by C-G formula based on Cr of 1.26). No results for input(s): LIPASE, AMYLASE in the last 72 hours.  Recent Labs  06/18/15 1216 06/20/15 0522  CKTOTAL 560* 108  TROPONINI <0.03  --    Invalid input(s): POCBNP No results for input(s): DDIMER in the last 72 hours.  Recent Labs  06/18/15 0248  HGBA1C 7.0*    Recent Labs  06/18/15 0248  CHOL 145  HDL 29*  LDLCALC 92  TRIG 161  CHOLHDL 5.0   No results for input(s): TSH, T4TOTAL, T3FREE, THYROIDAB in the last 72 hours.  Invalid input(s): FREET3 No results for input(s): VITAMINB12, FOLATE, FERRITIN, TIBC, IRON, RETICCTPCT in the last 72 hours. Coags: No results for input(s): INR in the last 72 hours.  Invalid input(s): PT Microbiology: No results found for this or any previous visit (from the past 240 hour(s)).   BRIEF HOSPITAL COURSE:  Acute CVA: Patient presented with worsening gait instability/recurrent falls. MRI positive for acute infarct left frontal infarct. MRA revealed significant vascular stenosis. Carotid doppler without significant stenosis, echo without embolic source. LDL 92 and HgbA1c 7.0. No new complaints from patient today. Per neurology recommendations, continue with 81 mg Aspirin + Plavix 75 mg QD x3 months then transition to Plavix alone.  Diabetes, type 2: Managed at home with Glipizide and Actos. CBGs stable with sliding scale insulin and carb modified diet on admission.  A1c 7.0. Contioral hypoglycemics  on discharge   Hypertension: Managed at home with Losartan-HCTZ. Discontinued HCTZ and continued Losartan on admission for permissive HTN. Blood pressure relatively stable-continue to monitor in the outpatient setting-80 start HCTZ over the next few days.  Chronic kidney disease stage III: Creatinine close to her baseline. Follow.  Hypokalemia: Suspect secondary to HCTZ, repleted. Please recheck electrolytes in 2 weeks.  Coronary Artery Disease: S/P stent, patient previously on Aspirin 325 QD. Transitioned to 81 mg Aspirin per stroke team-see above regarding antiplatelets recommendations  Dementia with delirium: Hospital course marked by intermittent episodes of delirium-started on low-dose Risperdal and Cogentin. No issues last night-comment acquired this morning.  Acute urinary retention: Per family-patient has had long-standing issues with "prostate"-during this hospitalization developed acute urinary retention for which a Foley catheter was inserted on 10/6. Flomax has been started. Outpatient voiding trial has been recommended in 2 weeks, also recommendations are to follow-up with a urologist.  TODAY-DAY OF DISCHARGE:  Subjective:   Parker Hunter today has no headache,no chest abdominal pain,no new weakness tingling or numbness, feels much better wants to go home today.  Objective:   Blood pressure 152/66, pulse 88, temperature 98.5 F (36.9 C), temperature source Oral, resp. rate 20, height  (1.676 m), weight 69.4 kg (153 lb), SpO2 100 %.  Intake/Output Summary (Last 24 hours) at 06/20/15 1037 Last data filed at 06/20/15 0809  Gross per 24 hour  Intake      0 ml  Output    300 ml  Net   -300 ml   Filed Weights   06/18/15 0203  Weight: 69.4 kg (153 lb)    Exam Awake Alert, Oriented *3, No new F.N deficits, Normal affect [place].AT,PERRAL Supple Neck,No JVD, No cervical lymphadenopathy appriciated.  Symmetrical Chest wall movement, Good air movement bilaterally,  CTAB RRR,No Gallops,Rubs or new Murmurs, No Parasternal Heave +ve B.Sounds, Abd Soft, Non tender, No organomegaly appriciated, No rebound -guarding or rigidity. No Cyanosis, Clubbing or edema, No new Rash or bruise  DISCHARGE CONDITION: Stable  DISPOSITION: SNF  DISCHARGE INSTRUCTIONS:    Activity:  As tolerated with Full fall precautions use walker/cane & assistance as needed  Get Medicines reviewed and adjusted: Please take all your medications with you for your next visit with your Primary MD  Please request your Primary MD to go over all hospital tests and procedure/radiological results at the follow up, please ask your Primary MD to get all Hospital records sent to his/her office.  If you experience worsening of your admission symptoms, develop shortness of breath, life threatening emergency, suicidal or homicidal thoughts you must seek medical attention immediately by calling 911 or calling your MD immediately  if symptoms less severe.  You must read complete instructions/literature along with all the possible adverse reactions/side effects for all the Medicines you take and that have been prescribed to you. Take any new Medicines after you have completely understood and accpet all the possible adverse reactions/side effects.   Do  not drive when taking Pain medications.   Do not take more than prescribed Pain, Sleep and Anxiety Medications  Special Instructions: If you have smoked or chewed Tobacco  in the last 2 yrs please stop smoking, stop any regular Alcohol  and or any Recreational drug use.  Wear Seat belts while driving.  Please note  You were cared for by a hospitalist during your hospital stay. Once you are discharged, your primary care physician will handle any further medical issues. Please note that NO REFILLS for any discharge medications will be authorized once you are discharged, as it is imperative that you return to your primary care physician (or establish a  relationship with a primary care physician if you do not have one) for your aftercare needs so that they can reassess your need for medications and monitor your lab values.   Diet recommendation: Diabetic Diet Heart Healthy diet  Discharge Instructions    Call MD for:  persistant dizziness or light-headedness    Complete by:  As directed      Diet - low sodium heart healthy    Complete by:  As directed      Diet Carb Modified    Complete by:  As directed      Increase activity slowly    Complete by:  As directed            Follow-up Information    Follow up with SETHI,PRAMOD, MD. Schedule an appointment as soon as possible for a visit in 2 months.   Specialties:  Neurology, Radiology   Contact information:   [address] [place] [address] [phone]       Please follow up.   Why:  in 2 weeks after discharge from SNF   Contact information:   Primary MD       Total Time spent on discharge equals 45 minutes.  SignedJeoffrey Massed 06/20/2015 10:37 AM

## 2015-06-20 NOTE — Care Management Important Message (Signed)
Important Message  Patient Details  Name: Parker Hunter MRN: [id number] Date of Birth: 01-17-1927   Medicare Important Message Given:  Yes-second notification given    Orson Aloe 06/20/2015, 12:12 PM

## 2015-06-20 NOTE — Clinical Social Work Note (Signed)
Clinical Social Worker facilitated patient discharge including contacting patient family and facility to confirm patient discharge plans.  Clinical information faxed to facility and family agreeable with plan.  CSW arranged ambulance transport via [facility] to [facility].  RN to call report prior to discharge.  DC packet prepared and on chart for transport with number for report.  Clinical Social Worker will sign off for now as social work intervention is no longer needed. Please consult [place] again if new need arises.  Derenda Fennel, MSW, LCSWA [phone] 06/20/2015 12:45 PM

## 2015-06-20 NOTE — Clinical Social Work Note (Signed)
Clinical Social Worker has assessed patient and family. Full psychosocial assessment to follow.  Derenda Fennel, MSW, LCSWA [phone] 06/20/2015 9:26 AM

## 2015-06-20 NOTE — Clinical Social Work Placement (Signed)
   CLINICAL SOCIAL WORK PLACEMENT  NOTE  Date:  06/20/2015  Patient Details  Name: Parker Hunter MRN: [id number] Date of Birth: 08-30-1927  Clinical Social Work is seeking post-discharge placement for this patient at the Skilled  Nursing Facility level of care (*CSW will initial, date and re-position this form in  chart as items are completed):  Yes   Patient/family provided with [facility] Clinical Social Work Department's list of facilities offering this level of care within the geographic area requested by the patient (or if unable, by the patient's family).  Yes   Patient/family informed of their freedom to choose among providers that offer the needed level of care, that participate in Medicare, Medicaid or managed care program needed by the patient, have an available bed and are willing to accept the patient.  Yes   Patient/family informed of 's ownership interest in [facility] and [facility], as well as of the fact that they are under no obligation to receive care at these facilities.  PASRR submitted to EDS on 06/20/15     PASRR number received on 06/20/15     Existing PASRR number confirmed on       FL2 transmitted to all facilities in geographic area requested by pt/family on 06/20/15     FL2 transmitted to all facilities within larger geographic area on       Patient informed that his/her managed care company has contracts with or will negotiate with certain facilities, including the following:        Yes   Patient/family informed of bed offers received.  Patient chooses bed at  [facility]     Physician recommends and patient chooses bed at      Patient to be transferred to  [facility]) on 06/20/15.  Patient to be transferred to facility by  Sharin Mons )     Patient family notified on 06/20/15 of transfer.  Name of family member notified:   (Pt's wife and daughter present at bedside)     PHYSICIAN Please  sign FL2, Please sign DNR     Additional Comment:    _______________________________________________ Vaughan Browner, LCSW 06/20/2015, 12:48 PM

## 2015-06-20 NOTE — Clinical Social Work Note (Signed)
Clinical Social Work Assessment  Patient Details  Name: Parker Hunter MRN: [id number] Date of Birth: 1926/12/12  Date of referral:  06/20/15               Reason for consult:  Facility Placement, Discharge Planning                Permission sought to share information with:  Case Manager, Magazine features editor, Family Supports Permission granted to share information::  Yes, Verbal Permission Granted  Name::      Zella Ball Psychologist, sport and exercise)  Agency::   ([facility] )  Relationship::   (Daughter )  Contact Information:   [phone])  Housing/Transportation Living arrangements for the past 2 months:  Single Family Home Source of Information:  Adult Children, Spouse Patient Interpreter Needed:  None Criminal Activity/Legal Involvement Pertinent to Current Situation/Hospitalization:  No - Comment as needed Significant Relationships:  Adult Children, Spouse Lives with:  Spouse Do you feel safe going back to the place where you live?  No Need for family participation in patient care:  No (Coment)  Care giving concerns:  Patient requiring 24 hour supervision/asssitance and continued therapy at skilled level.   Social Worker assessment / plan:  Visual merchandiser spoke with pt's dtr, Zella Ball and spouse in reference to post-acute placement for SNF. CSW introduced CSW role and SNF process. CSW reviewed SNF list. Pt's dtr and family would prefer [facility]. Patient is from home with spouse. No further concerns reported by family. CSW has completed FL-2 and faxed clinicals to [facility]. [facility] willing to accept for today. CSW will continue to follow pt and pt's family for continued support and to facilitate pt's discharge needs once medically stable.   Employment status:  Retired Health and safety inspector:  Medicare PT Recommendations:  Skilled Nursing Facility Information / Referral to community resources:  Skilled Nursing Facility  Patient/Family's  Response to care:  Pt alert and disoriented. Pt's family agreeable to SNF at [facility]. Pt's family pleasant and appreciated social work intervention.  Patient/Family's Understanding of and Emotional Response to Diagnosis, Current Treatment, and Prognosis:  Family aware that patient had stroke and understanding of on-going treatment. CSW remains available as needed.  Emotional Assessment Appearance:  Appears stated age Attitude/Demeanor/Rapport:  Unable to Assess Affect (typically observed):  Unable to Assess Orientation:  Oriented to Self Alcohol / Substance use:  Not Applicable Psych involvement (Current and /or in the community):  No (Comment)  Discharge Needs  Concerns to be addressed:  Care Coordination Readmission within the last 30 days:  No Current discharge risk:  Dependent with Mobility Barriers to Discharge:  Barriers Resolved   Derenda Fennel, MSW, LCSWA [phone] 06/20/2015 3:26 PM

## 2015-06-20 NOTE — Progress Notes (Signed)
Patient ready for discharge to [facility]. IV removed, tele discontinued. Patient's neuro assessment unchanged from AM assessment. Coude catheter to stay for discharge. Patient, family aware of discharge. Will continue to monitor until transportation arrives.

## 2015-06-23 ENCOUNTER — Encounter: Payer: Self-pay | Admitting: Adult Health

## 2015-06-23 ENCOUNTER — Non-Acute Institutional Stay (SKILLED_NURSING_FACILITY): Payer: Medicare Other | Admitting: Adult Health

## 2015-06-23 DIAGNOSIS — E1149 Type 2 diabetes mellitus with other diabetic neurological complication: Secondary | ICD-10-CM

## 2015-06-23 DIAGNOSIS — N400 Enlarged prostate without lower urinary tract symptoms: Secondary | ICD-10-CM

## 2015-06-23 DIAGNOSIS — I6320 Cerebral infarction due to unspecified occlusion or stenosis of unspecified precerebral arteries: Secondary | ICD-10-CM

## 2015-06-23 DIAGNOSIS — F0391 Unspecified dementia with behavioral disturbance: Secondary | ICD-10-CM

## 2015-06-23 DIAGNOSIS — I2510 Atherosclerotic heart disease of native coronary artery without angina pectoris: Secondary | ICD-10-CM

## 2015-06-23 DIAGNOSIS — N183 Chronic kidney disease, stage 3 unspecified: Secondary | ICD-10-CM

## 2015-06-23 DIAGNOSIS — I10 Essential (primary) hypertension: Secondary | ICD-10-CM

## 2015-06-23 DIAGNOSIS — F0390 Unspecified dementia without behavioral disturbance: Secondary | ICD-10-CM | POA: Insufficient documentation

## 2015-06-23 DIAGNOSIS — K219 Gastro-esophageal reflux disease without esophagitis: Secondary | ICD-10-CM

## 2015-06-23 NOTE — Progress Notes (Signed)
Patient ID: Parker Hunter, male   DOB: 12/07/1926, 81 y.o.   MRN: [id number]    DATE:  06/23/2015 MRN:  [id number]  BIRTHDAY: 01-24-1927  Facility:  Nursing Home Location:  [facility]  Nursing Home Room Number: [id number]  LEVEL OF CARE:  SNF [phone])  Contact Information    Name Relation Home Work [place]   Doubek,[name]  [phone]     Rafiel, [place]    [phone]       Chief Complaint  Patient presents with  . Hospitalization Follow-up    Acute CVA, diabetes mellitus, hypertension, chronic kidney disease is stage III, CAD S/P stent, dementia, BPH and GERD    HISTORY OF PRESENT ILLNESS:  This is an 81 year old male who was been admitted to [facility] on 06/20/15 from [facility]. He has PMH of CAD S/P stenting, chronic kidney disease is stage III and diabetes mellitus type 2. He had frequent falls and difficulty walking. MRI was positive for acute infarct left frontal infarct. MRA revealed significant vascular stenosis.  He has been admitted for a short-term rehabilitation.  PAST MEDICAL HISTORY:  Past Medical History  Diagnosis Date  . Diabetes mellitus without complication (HCC)   . TIA (transient ischemic attack)   . Neuropathy (HCC)   . Status post dilation of esophageal narrowing   . Hypertension   . Stented coronary artery      CURRENT MEDICATIONS: Reviewed  Patient's Medications  New Prescriptions   No medications on file  Previous Medications   ASPIRIN 81 MG TABLET    Take 1 tablet (81 mg total) by mouth daily. Aspirin/Plavix for 3 months, after that Plavix alone   ATORVASTATIN (LIPITOR) 40 MG TABLET    Take 40 mg by mouth daily.   BENZTROPINE (COGENTIN) 1 MG TABLET    Take 1 tablet (1 mg total) by mouth daily.   CLOPIDOGREL (PLAVIX) 75 MG TABLET    Take 1 tablet (75 mg total) by mouth daily. Aspirin/Plavix for 3 months, after that Plavix alone   ESOMEPRAZOLE (NEXIUM) 40 MG CAPSULE    Take 40 mg by mouth daily.   GLIPIZIDE (GLUCOTROL XL) 10 MG  24 HR TABLET    Take 10 mg by mouth daily.   HYDROCODONE-ACETAMINOPHEN (NORCO/VICODIN) 5-325 MG TABLET    Take 1 tablet by mouth every 6 (six) hours as needed for moderate pain or severe pain.   PIOGLITAZONE (ACTOS) 45 MG TABLET    Take 45 mg by mouth daily.   RISPERIDONE (RISPERDAL) 0.5 MG TABLET    Take 1 tablet (0.5 mg total) by mouth at bedtime.   TAMSULOSIN (FLOMAX) 0.4 MG CAPS CAPSULE    Take 1 capsule (0.4 mg total) by mouth daily.  Modified Medications   No medications on file  Discontinued Medications   No medications on file     Allergies  Allergen Reactions  . Ibuprofen Other (See Comments)    GI Bleeding     REVIEW OF SYSTEMS:  GENERAL: no change in appetite, no fatigue, no weight changes, no fever, chills or weakness EYES: Denies change in vision, dry eyes, eye pain, itching or discharge EARS: Denies change in hearing, ringing in ears, or earache NOSE: Denies nasal congestion or epistaxis MOUTH and THROAT: Denies oral discomfort, gingival pain or bleeding, pain from teeth or hoarseness   RESPIRATORY: no cough, SOB, DOE, wheezing, hemoptysis CARDIAC: no chest pain, edema or palpitations GI: no abdominal pain, diarrhea, constipation, heart burn, nausea or vomiting GU: Denies dysuria,  frequency, hematuria, incontinence, or discharge PSYCHIATRIC: Denies feeling of depression or anxiety. No report of hallucinations, insomnia, paranoia, or agitation   PHYSICAL EXAMINATION  GENERAL APPEARANCE: Well nourished. In no acute distress. Normal body habitus HEAD: Normal in size and contour. No evidence of trauma EYES: Lids open and close normally. No blepharitis, entropion or ectropion. PERRL. Conjunctivae are clear and sclerae are white. Lenses are without opacity EARS: Pinnae are normal. Patient hears normal voice tunes of the examiner MOUTH and THROAT: Lips are without lesions. Oral mucosa is moist and without lesions. Tongue is normal in shape, size, and color and without  lesions NECK: supple, trachea midline, no neck masses, no thyroid tenderness, no thyromegaly LYMPHATICS: no LAN in the neck, no supraclavicular LAN RESPIRATORY: breathing is even & unlabored, BS CTAB CARDIAC: RRR, no murmur,no extra heart sounds, no edema GI: abdomen soft, normal BS, no masses, no tenderness, no hepatomegaly, no splenomegaly EXTREMITIES:  Able to move 4 extremities PSYCHIATRIC: Alert and oriented X 3. Affect and behavior are appropriate  LABS/RADIOLOGY: Labs reviewed: Basic Metabolic Panel:  Recent Labs  16/10/96 1927 06/18/15 0248 06/19/15 0657  NA 143 142 142  K 3.1* 2.9* 3.5  CL 106 102 106  CO2 GLUCOSE 181* 166* 128*  BUN 33* 25* 22*  CREATININE 1.57* 1.54* 1.26*  CALCIUM 8.8* 8.6* 8.6*   Liver Function Tests:  Recent Labs  06/18/15 0248  AST 40  ALT 17  ALKPHOS 73  BILITOT 0.9  PROT 6.2*  ALBUMIN 3.3*    CBC:  Recent Labs  05/14/15 1830 06/17/15 1927 06/18/15 0248  WBC 10.4 8.0 7.4  NEUTROABS 7.8* 4.8  --   HGB 13.9 13.2 13.0  HCT 39.7 37.6* 37.2*  MCV 85.9 85.8 85.9  PLT 161 183 147*   A1C: Lipid Panel:  Recent Labs  06/18/15 0248  HDL 29*   Cardiac Enzymes:  Recent Labs  06/18/15 1216 06/20/15 0522  CKTOTAL 560* 108  TROPONINI <0.03  --    BNP: Invalid input(s): POCBNP CBG:  Recent Labs  06/19/15 2144 06/20/15 0615 06/20/15 1113  GLUCAP 157* 202* 212*      Dg Chest 2 View  06/17/2015   CLINICAL DATA:  Falls.  EXAM: CHEST  2 VIEW  COMPARISON:  May 14, 2015.  FINDINGS: The heart size and mediastinal contours are within normal limits. Both lungs are clear. No pneumothorax or pleural effusion is noted. The visualized skeletal structures are unremarkable.  IMPRESSION: No active cardiopulmonary disease.   Electronically Signed   By: Lupita Raider, M.D.   On: 06/17/2015 20:37   Dg Ribs Unilateral Right  06/18/2015   CLINICAL DATA:  Status post recent fall onto bathtub. Right anterior lower rib  pain. Initial encounter.  EXAM: RIGHT RIBS - 2 VIEW  COMPARISON:  Chest radiograph performed 05/14/2015  FINDINGS: No displaced rib fractures are seen.  The visualized portions of the lungs are clear. The cardiomediastinal silhouette is incompletely characterized, but appears grossly unremarkable.  IMPRESSION: No displaced rib fracture seen.   Electronically Signed   By: Roanna Raider M.D.   On: 06/18/2015 03:40   Mr Brain Wo Contrast  06/17/2015   CLINICAL DATA:  Initial evaluation for increase falls for past 2 weeks. Loss of balance.  EXAM: MRI HEAD WITHOUT CONTRAST  TECHNIQUE: Multiplanar, multiecho pulse sequences of the brain and surrounding structures were obtained without intravenous contrast.  COMPARISON:  Prior CT from 09/21/2011.  FINDINGS: Study is markedly degraded by  motion artifact.  Diffuse prominence of the CSF containing spaces is compatible with generalized age-related cerebral atrophy. Extensive patchy and confluent T2/FLAIR hyperintensity within the periventricular deep white matter both cerebral hemispheres noted, most likely related to moderate chronic small vessel ischemic disease. Encephalomalacia within the parasagittal anterior right frontal lobe most likely related to remote right ACA territory infarct. Remote lacunar infarct present within the right basal ganglia extending into the right corona radiata. Additional possible small remote infarcts within the right cerebellar hemisphere.  Diffusion-weighted sequences limited by motion artifact. There is patchy cortical infarct within the high left parietal lobe, best appreciated on coronal DWI sequence (series 5, image 11). Additionally, there is patchy infarcts extending superiorly along the deep/periventricular white matter of the left centrum semi ovale. Probable a few small foci of patchy cortical infarct more anteriorly within the left frontal lobe (series 702, image 21). Correlation with ADC map markedly limited on this study due to  motion artifact. Distribution of these infarcts suggestive of possible underlying watershed etiology.  Intravascular flow voids grossly maintained. No definite acute or chronic intracranial hemorrhage.  No definite mass lesion. No midline shift or mass effect. Ventricular prominence related to global parenchymal volume loss present without hydrocephalus. No extra-axial fluid collection.  Craniocervical junction within normal limits. Pituitary gland grossly unremarkable. No definite acute abnormality about the orbits. Probable lens extraction on the right.  Scattered mucosal thickening within the ethmoidal air cells and left maxillary sinus. No definite mastoid effusion.  Bone marrow signal intensity within normal limits. Scalp soft tissues unremarkable.  IMPRESSION: 1. Motion degraded study. 2. Patchy acute ischemic infarcts involving the cortical gray matter and deep/periventricular white matter of the left parietal lobe extending anteriorly into the left frontal lobe as above. 3. No other definite acute intracranial process. 4. Remote infarcts involving the anterior right frontal lobe, right basal ganglia/corona radiata, and possibly right cerebellar hemisphere. 5. Advanced age-related cerebral atrophy with moderate chronic microvascular ischemic disease.   Electronically Signed   By: Rise Mu M.D.   On: 06/17/2015 22:10   Mr Maxine Glenn Head/brain Wo Cm  06/18/2015   CLINICAL DATA:  Initial evaluation for stroke.  EXAM: MRA HEAD WITHOUT CONTRAST  TECHNIQUE: Angiographic images of the Circle of Willis were obtained using MRA technique without intravenous contrast.  COMPARISON:  Prior MRI 06/17/2015.  FINDINGS: ANTERIOR CIRCULATION:  Visualized distal cervical segments of the internal carotid arteries are patent with antegrade flow. The petrous segments are well opacified. Scattered atheromatous irregularity present within mean cavernous/ supraclinoid segments bilaterally without hemodynamically significant  stenosis. Mild to moderate narrowing of the supra clinoid segments bilaterally.  Right A1 segment is hypoplastic and irregular. Left A1 segment is well opacified. Anterior communicating artery normal. Anterior cerebral arteries grossly opacified to their distal aspects. Probable multi focal atheromatous irregularity within the anterior cerebral arteries bilaterally.  Right M1 segment patent to the right MCA bifurcation it without focal stenosis or occlusion. Distal right MCA branches well opacified but demonstrate multi focal atheromatous irregularity.  Left M1 segment patent proximally. There is a focal short segment high-grade stenosis within the distal left M1 segment measuring approximately 2 mm in length (series 304, image 10). Left MCA branches are opacified distally.  POSTERIOR CIRCULATION:  Left vertebral artery is dominant and patent to the vertebrobasilar junction. Multi focal atheromatous irregularity with moderate narrowing present within the distal left V4 segment. Right vertebral artery and demonstrates multi focal atheromatous irregularity but is patent to the vertebrobasilar junction as well. Posterior inferior  cerebral arteries not well evaluated on this exam. Basilar patent with multi focal atheromatous irregularity and mild stenoses. Superior cerebellar arteries opacified proximally. Left posterior cerebral artery arises from the basilar artery. Left P1 segment widely patent. Left P2 segment opacified to its distal aspect. Distal branch atheromatous irregularity within the distal left PCA. There is fetal origin of the right PCA with a in widely patent right posterior communicating artery. Right PCA demonstrates multi focal atheromatous regularity and is somewhat attenuated distally.  No aneurysm or vascular malformation.  IMPRESSION: 1. No large or proximal arterial branch occlusion within the intracranial circulation. 2. Focal high-grade severe stenosis within the distal left M1 segment measuring  2 mm in length. 3. Additional fairly advanced multi focal atheromatous irregularity throughout the intracranial circulation as above.   Electronically Signed   By: Rise Mu M.D.   On: 06/18/2015 04:51    ASSESSMENT/PLAN:  Acute CVA - for rehabilitation; follow-up with Dr. Pearlean Brownie, neurology, in 2 months; continue aspirin 81 mg by mouth every 81 mg daily and Plavix 81 mg by mouth daily, Plavix 75 mg 1 tab by mouth daily; check CBC in 1 week  Diabetes mellitus type 2 - hemoglobin A1c 7.0; continue glipizide 10 mg 24 hour 1 tab by mouth daily Actos 45 mg 1 tab by mouth daily  Hypertension - well-controlled; continue losartan 100 mg 1 tab by mouth daily; BP twice a day 1 week  CKD stage III - creatinine 1.26; check BMP in 1 week  CAD S/P stent - continue aspirin 81 mg 1 tab by mouth daily  Dementia with behavioral disturbance - continue Risperdal 0.5 mg 1 tab by mouth daily at bedtime and Cogentin 100 mg by mouth daily at bedtime  BPH with urinary retention -recently started on Flomax 0.4 mg 1 capsule by mouth daily; follow-up with urology  GERD - continue Nexium 40 mg 1 capsule by mouth daily     Goals of care:  Short-term rehabilitation    [facility], NP [facility] [phone]

## 2015-06-25 ENCOUNTER — Other Ambulatory Visit: Payer: Self-pay

## 2015-06-25 MED ORDER — LORAZEPAM 0.5 MG PO TABS: 0.5000 mg | ORAL_TABLET | Freq: Every evening | ORAL | Status: AC | PRN

## 2015-06-25 NOTE — Telephone Encounter (Signed)
Rx faxed to [facility] @ [phone], phone number [phone]  

## 2015-06-27 ENCOUNTER — Non-Acute Institutional Stay (SKILLED_NURSING_FACILITY): Payer: Medicare Other | Admitting: Internal Medicine

## 2015-06-27 DIAGNOSIS — R5381 Other malaise: Secondary | ICD-10-CM

## 2015-06-27 DIAGNOSIS — E039 Hypothyroidism, unspecified: Secondary | ICD-10-CM

## 2015-06-27 DIAGNOSIS — I10 Essential (primary) hypertension: Secondary | ICD-10-CM | POA: Diagnosis not present

## 2015-06-27 DIAGNOSIS — N183 Chronic kidney disease, stage 3 unspecified: Secondary | ICD-10-CM

## 2015-06-27 DIAGNOSIS — I639 Cerebral infarction, unspecified: Secondary | ICD-10-CM | POA: Diagnosis not present

## 2015-06-27 DIAGNOSIS — F0391 Unspecified dementia with behavioral disturbance: Secondary | ICD-10-CM

## 2015-06-27 DIAGNOSIS — F03918 Unspecified dementia, unspecified severity, with other behavioral disturbance: Secondary | ICD-10-CM

## 2015-06-27 DIAGNOSIS — R339 Retention of urine, unspecified: Secondary | ICD-10-CM | POA: Diagnosis not present

## 2015-06-27 DIAGNOSIS — K219 Gastro-esophageal reflux disease without esophagitis: Secondary | ICD-10-CM

## 2015-06-27 DIAGNOSIS — E1159 Type 2 diabetes mellitus with other circulatory complications: Secondary | ICD-10-CM

## 2015-06-27 DIAGNOSIS — L22 Diaper dermatitis: Secondary | ICD-10-CM

## 2015-06-27 NOTE — Progress Notes (Signed)
Patient ID: Parker ChafeRichard Nijjar, male   DOB: 12-05-1926, 81 y.o.   MRN: [id number]     [facility]   PCP: No primary care provider on file.  Code Status: FULL CODE  Allergies  Allergen Reactions  . Ibuprofen Other (See Comments)    GI Bleeding    Chief Complaint  Patient presents with  . New Admit To SNF     HPI:  81 y.o. patient is here for short term rehabilitation post hospital admission from 06/17/15-06/20/15 with acute CVA left frontal infarct. He also had significant vascular stenosis. He was seen by neurology and started on aspirin and plavix. He also had acute urinary retention and and had a foley placed. He has PMH of CAD S/P stenting, chronic kidney disease is stage III and diabetes mellitus type 2. He is seen in his room today. He has had falls in the facility per staff. Agitated behavior noted by staff periodically He has been seen by urology today.    Review of Systems:  Constitutional: positive for easy fatigue. Negative for fever, chills, diaphoresis.  HENT: Negative for headache, congestion, nasal discharge, difficulty swallowing.   Eyes: Negative for eye pain, blurred vision, double vision and discharge.  Respiratory: Negative for cough, shortness of breath and wheezing.   Cardiovascular: Negative for chest pain, palpitations, leg swelling.  Gastrointestinal: Negative for heartburn, nausea, vomiting, abdominal pain. Bowel movement this am Genitourinary: Negative for flank pain.  Musculoskeletal: Negative for back pain Skin: Negative for itching, rash.  Neurological: Negative for dizziness, tingling, focal weakness Psychiatric/Behavioral: Negative for depression.   Past Medical History  Diagnosis Date  . Diabetes mellitus without complication (HCC)   . TIA (transient ischemic attack)   . Neuropathy (HCC)   . Status post dilation of esophageal narrowing   . Hypertension   . Stented coronary artery    Past Surgical History    Procedure Laterality Date  . Cardiac catheterization    . Hernia repair    . Prostate surgery    . Eye surgery    . Coronary angioplasty    . Esphageal dilation     Social History:   reports that he has quit smoking. He does not have any smokeless tobacco history on file. He reports that he drinks alcohol. He reports that he does not use illicit drugs.  Family History  Problem Relation Age of Onset  . Diabetes Mellitus II Mother   . Stroke Father   . Diabetes Mellitus II Sister     Medications:   Medication List       This list is accurate as of: 06/27/15 10:08 AM.  Always use your most recent med list.               ACTOS 45 MG tablet  Generic drug:  pioglitazone  Take 45 mg by mouth daily.     aspirin 81 MG tablet  Take 1 tablet (81 mg total) by mouth daily. Aspirin/Plavix for 3 months, after that Plavix alone     atorvastatin 40 MG tablet  Commonly known as:  LIPITOR  Take 40 mg by mouth daily.     benztropine 1 MG tablet  Commonly known as:  COGENTIN  Take 1 tablet (1 mg total) by mouth daily.     clopidogrel 75 MG tablet  Commonly known as:  PLAVIX  Take 1 tablet (75 mg total) by mouth daily. Aspirin/Plavix for 3 months, after that Plavix alone  esomeprazole 40 MG capsule  Commonly known as:  NEXIUM  Take 40 mg by mouth daily.     glipiZIDE 10 MG 24 hr tablet  Commonly known as:  GLUCOTROL XL  Take 10 mg by mouth daily.     HYDROcodone-acetaminophen 5-325 MG tablet  Commonly known as:  NORCO/VICODIN  Take 1 tablet by mouth every 6 (six) hours as needed for moderate pain or severe pain.     LORazepam 0.5 MG tablet  Commonly known as:  ATIVAN  Take 1 tablet (0.5 mg total) by mouth at bedtime as needed for anxiety.     risperiDONE 0.5 MG tablet  Commonly known as:  RISPERDAL  Take 1 tablet (0.5 mg total) by mouth at bedtime.     tamsulosin 0.4 MG Caps capsule  Commonly known as:  FLOMAX  Take 1 capsule (0.4 mg total) by mouth daily.          Physical Exam: Filed Vitals:   06/27/15 1008  BP: 139/75  Pulse: 79  Temp: 97.9 F (36.6 C)  Resp: 18  Weight: 154 lb 3.2 oz (69.945 kg)  SpO2: 97%    General- elderly male, in no acute distress Head- normocephalic, atraumatic Nose- normal nasal mucosa, no maxillary or frontal sinus tenderness, no nasal discharge Throat- moist mucus membrane Eyes- PERRLA, EOMI, no pallor, no icterus, no discharge, normal conjunctiva, normal sclera Neck- no cervical lymphadenopathy Cardiovascular- normal s1,s2, palpable dorsalis pedis and radial pulses, no leg edema Respiratory- bilateral clear to auscultation, no wheeze, no rhonchi, no crackles, no use of accessory muscles Abdomen- bowel sounds present, soft, non tender, foley catheter in place Musculoskeletal- able to move all 4 extremities, generalized weakness  Neurological- no focal deficit, alert and oriented to person and time Skin- warm and dry, redness in buttock area with some excoriation Psychiatry- normal mood and affect at present    Labs reviewed: Basic Metabolic Panel:  Recent Labs  16/10/96 1927 06/18/15 0248 06/19/15 0657  NA 143 142 142  K 3.1* 2.9* 3.5  CL 106 102 106  CO2 28 29 28   GLUCOSE 181* 166* 128*  BUN 33* 25* 22*  CREATININE 1.57* 1.54* 1.26*  CALCIUM 8.8* 8.6* 8.6*   Liver Function Tests:  Recent Labs  06/18/15 0248  AST 40  ALT 17  ALKPHOS 73  BILITOT 0.9  PROT 6.2*  ALBUMIN 3.3*   No results for input(s): LIPASE, AMYLASE in the last 8760 hours. No results for input(s): AMMONIA in the last 8760 hours. CBC:  Recent Labs  05/14/15 1830 06/17/15 1927 06/18/15 0248  WBC 10.4 8.0 7.4  NEUTROABS 7.8* 4.8  --   HGB 13.9 13.2 13.0  HCT 39.7 37.6* 37.2*  MCV 85.9 85.8 85.9  PLT 161 183 147*   Cardiac Enzymes:  Recent Labs  06/18/15 1216 06/20/15 0522  CKTOTAL 560* 108  TROPONINI <0.03  --    BNP: Invalid input(s): POCBNP CBG:  Recent Labs  06/19/15 2144 06/20/15 0615  06/20/15 1113  GLUCAP 157* 202* 212*    Radiological Exams: Dg Chest 2 View  06/17/2015  CLINICAL DATA:  Falls. EXAM: CHEST  2 VIEW COMPARISON:  May 14, 2015. FINDINGS: The heart size and mediastinal contours are within normal limits. Both lungs are clear. No pneumothorax or pleural effusion is noted. The visualized skeletal structures are unremarkable. IMPRESSION: No active cardiopulmonary disease. Electronically Signed   By: Lupita Raider, M.D.   On: 06/17/2015 20:37   Dg Ribs Unilateral Right  06/18/2015  CLINICAL DATA:  Status post recent fall onto bathtub. Right anterior lower rib pain. Initial encounter. EXAM: RIGHT RIBS - 2 VIEW COMPARISON:  Chest radiograph performed 05/14/2015 FINDINGS: No displaced rib fractures are seen. The visualized portions of the lungs are clear. The cardiomediastinal silhouette is incompletely characterized, but appears grossly unremarkable. IMPRESSION: No displaced rib fracture seen. Electronically Signed   By: Roanna Raider M.D.   On: 06/18/2015 03:40   Mr Brain Wo Contrast  06/17/2015  CLINICAL DATA:  Initial evaluation for increase falls for past 2 weeks. Loss of balance. EXAM: MRI HEAD WITHOUT CONTRAST TECHNIQUE: Multiplanar, multiecho pulse sequences of the brain and surrounding structures were obtained without intravenous contrast. COMPARISON:  Prior CT from 09/21/2011. FINDINGS: Study is markedly degraded by motion artifact. Diffuse prominence of the CSF containing spaces is compatible with generalized age-related cerebral atrophy. Extensive patchy and confluent T2/FLAIR hyperintensity within the periventricular deep white matter both cerebral hemispheres noted, most likely related to moderate chronic small vessel ischemic disease. Encephalomalacia within the parasagittal anterior right frontal lobe most likely related to remote right ACA territory infarct. Remote lacunar infarct present within the right basal ganglia extending into the right corona  radiata. Additional possible small remote infarcts within the right cerebellar hemisphere. Diffusion-weighted sequences limited by motion artifact. There is patchy cortical infarct within the high left parietal lobe, best appreciated on coronal DWI sequence (series 5, image 11). Additionally, there is patchy infarcts extending superiorly along the deep/periventricular white matter of the left centrum semi ovale. Probable a few small foci of patchy cortical infarct more anteriorly within the left frontal lobe (series 702, image 21). Correlation with ADC map markedly limited on this study due to motion artifact. Distribution of these infarcts suggestive of possible underlying watershed etiology. Intravascular flow voids grossly maintained. No definite acute or chronic intracranial hemorrhage. No definite mass lesion. No midline shift or mass effect. Ventricular prominence related to global parenchymal volume loss present without hydrocephalus. No extra-axial fluid collection. Craniocervical junction within normal limits. Pituitary gland grossly unremarkable. No definite acute abnormality about the orbits. Probable lens extraction on the right. Scattered mucosal thickening within the ethmoidal air cells and left maxillary sinus. No definite mastoid effusion. Bone marrow signal intensity within normal limits. Scalp soft tissues unremarkable. IMPRESSION: 1. Motion degraded study. 2. Patchy acute ischemic infarcts involving the cortical gray matter and deep/periventricular white matter of the left parietal lobe extending anteriorly into the left frontal lobe as above. 3. No other definite acute intracranial process. 4. Remote infarcts involving the anterior right frontal lobe, right basal ganglia/corona radiata, and possibly right cerebellar hemisphere. 5. Advanced age-related cerebral atrophy with moderate chronic microvascular ischemic disease. Electronically Signed   By: Rise Mu M.D.   On: 06/17/2015 22:10    Mr Maxine Glenn Head/brain Wo Cm  06/18/2015  CLINICAL DATA:  Initial evaluation for stroke. EXAM: MRA HEAD WITHOUT CONTRAST TECHNIQUE: Angiographic images of the Circle of Willis were obtained using MRA technique without intravenous contrast. COMPARISON:  Prior MRI 06/17/2015. FINDINGS: ANTERIOR CIRCULATION: Visualized distal cervical segments of the internal carotid arteries are patent with antegrade flow. The petrous segments are well opacified. Scattered atheromatous irregularity present within mean cavernous/ supraclinoid segments bilaterally without hemodynamically significant stenosis. Mild to moderate narrowing of the supra clinoid segments bilaterally. Right A1 segment is hypoplastic and irregular. Left A1 segment is well opacified. Anterior communicating artery normal. Anterior cerebral arteries grossly opacified to their distal aspects. Probable multi focal atheromatous irregularity within the anterior cerebral arteries bilaterally.  Right M1 segment patent to the right MCA bifurcation it without focal stenosis or occlusion. Distal right MCA branches well opacified but demonstrate multi focal atheromatous irregularity. Left M1 segment patent proximally. There is a focal short segment high-grade stenosis within the distal left M1 segment measuring approximately 2 mm in length (series 304, image 10). Left MCA branches are opacified distally. POSTERIOR CIRCULATION: Left vertebral artery is dominant and patent to the vertebrobasilar junction. Multi focal atheromatous irregularity with moderate narrowing present within the distal left V4 segment. Right vertebral artery and demonstrates multi focal atheromatous irregularity but is patent to the vertebrobasilar junction as well. Posterior inferior cerebral arteries not well evaluated on this exam. Basilar patent with multi focal atheromatous irregularity and mild stenoses. Superior cerebellar arteries opacified proximally. Left posterior cerebral artery arises from  the basilar artery. Left P1 segment widely patent. Left P2 segment opacified to its distal aspect. Distal branch atheromatous irregularity within the distal left PCA. There is fetal origin of the right PCA with a in widely patent right posterior communicating artery. Right PCA demonstrates multi focal atheromatous regularity and is somewhat attenuated distally. No aneurysm or vascular malformation. IMPRESSION: 1. No large or proximal arterial branch occlusion within the intracranial circulation. 2. Focal high-grade severe stenosis within the distal left M1 segment measuring 2 mm in length. 3. Additional fairly advanced multi focal atheromatous irregularity throughout the intracranial circulation as above. Electronically Signed   By: Rise Mu M.D.   On: 06/18/2015 04:51    Assessment/Plan  Physical deconditioning Will have him work with physical therapy and occupational therapy team to help with gait training and muscle strengthening exercises.fall precautions. Skin care. Encourage to be out of bed.   acute CVA Left frontal infarct. Has f/u with neurology. Continue aspirin and plavix for 3 months and then plavix only. Monitor BP and continue BP medication. Continue statin.Will have patient work with PT/OT as tolerated to regain strength and restore function.  Fall precautions are in place.  Urinary retention S/p foley catheter. Continue foley care. Voiding trial to be done next week. Urology on board. Continue flomax fornow  Diaper rash Start fluconazole 100 mg daily x 5 days and continue perineal hygiene and skin care  HTN Stable, continue losartan 100 mg daily and monitor bp reading  Diabetes mellitus type 2 hemoglobin A1c 7.0. Monitor cbg. Continue actos 45 mg daily and glipizide 10 mg daily. Continue ARB, statin and ASA  CKD stage III  check BMP  Dementia with behavioral disturbance continue Risperdal 0.5 mg daily at bedtime and Cogentin 100 mg daily, currently on ativan 0.5  mg qhs prn for agitation. psych consult   GERD Stable, continue Nexium 40 mg daily   Goals of care: short term rehabilitation   Labs/tests ordered: cbc with diff, cmp, tsh 06/28/15  Family/ staff Communication: reviewed care plan with patient and nursing supervisor    Oneal Grout, MD  [facility] [phone] (Monday-Friday 8 am - 5 pm) [phone] (afterhours)

## 2015-07-17 ENCOUNTER — Encounter: Payer: Self-pay | Admitting: Adult Health

## 2015-07-17 ENCOUNTER — Non-Acute Institutional Stay (SKILLED_NURSING_FACILITY): Payer: Medicare Other | Admitting: Adult Health

## 2015-07-17 DIAGNOSIS — E1149 Type 2 diabetes mellitus with other diabetic neurological complication: Secondary | ICD-10-CM

## 2015-07-17 DIAGNOSIS — N183 Chronic kidney disease, stage 3 unspecified: Secondary | ICD-10-CM

## 2015-07-17 DIAGNOSIS — N400 Enlarged prostate without lower urinary tract symptoms: Secondary | ICD-10-CM

## 2015-07-17 DIAGNOSIS — F419 Anxiety disorder, unspecified: Secondary | ICD-10-CM

## 2015-07-17 DIAGNOSIS — K219 Gastro-esophageal reflux disease without esophagitis: Secondary | ICD-10-CM

## 2015-07-17 DIAGNOSIS — I10 Essential (primary) hypertension: Secondary | ICD-10-CM

## 2015-07-17 DIAGNOSIS — I6320 Cerebral infarction due to unspecified occlusion or stenosis of unspecified precerebral arteries: Secondary | ICD-10-CM

## 2015-07-17 DIAGNOSIS — I2510 Atherosclerotic heart disease of native coronary artery without angina pectoris: Secondary | ICD-10-CM

## 2015-07-17 DIAGNOSIS — F0391 Unspecified dementia with behavioral disturbance: Secondary | ICD-10-CM | POA: Diagnosis not present

## 2015-07-22 DIAGNOSIS — E1142 Type 2 diabetes mellitus with diabetic polyneuropathy: Secondary | ICD-10-CM | POA: Diagnosis not present

## 2015-07-22 DIAGNOSIS — F0390 Unspecified dementia without behavioral disturbance: Secondary | ICD-10-CM | POA: Diagnosis not present

## 2015-07-22 DIAGNOSIS — I69398 Other sequelae of cerebral infarction: Secondary | ICD-10-CM | POA: Diagnosis not present

## 2015-07-22 DIAGNOSIS — M6281 Muscle weakness (generalized): Secondary | ICD-10-CM | POA: Diagnosis not present

## 2015-07-22 NOTE — Progress Notes (Signed)
Patient ID: Parker Hunter, male   DOB: 1927/03/06, 81 y.o.   MRN: [id number]    DATE:  07/17/15  MRN:  [id number]  BIRTHDAY: May 19, 1927  Facility:  Nursing Home Location:  [facility]  Nursing Home Room Number: [id number]  LEVEL OF CARE:  SNF [phone])  Contact Information    Name Relation Home Work [place]   Duca,[name]  [phone]     Reza, Crymes    [phone]       Chief Complaint  Patient presents with  . Discharge Note    Acute CVA, diabetes mellitus, hypertension, chronic kidney disease is stage III, CAD S/P stent, dementia, BPH and GERD    HISTORY OF PRESENT ILLNESS:  This is an 81 year old male who is for discharge home with home health PT for endurance, OT for ADLs and nurse aide for showers. DME:  Standard wheelchair and cushion, semi-electric hospital bed and 3 in 1 bedside commode.  He has been admitted to [facility] on 06/20/15 from [facility]. He has PMH of CAD S/P stenting, chronic kidney disease is stage III and diabetes mellitus type 2. He had frequent falls and difficulty walking. MRI was positive for acute infarct left frontal infarct. MRA revealed significant vascular stenosis.  Patient was admitted to this facility for short-term rehabilitation after the patient's recent hospitalization.  Patient has completed SNF rehabilitation and therapy has cleared the patient for discharge.   PAST MEDICAL HISTORY:  Past Medical History  Diagnosis Date  . Diabetes mellitus without complication (HCC)   . TIA (transient ischemic attack)   . Neuropathy (HCC)   . Status post dilation of esophageal narrowing   . Hypertension   . Stented coronary artery      CURRENT MEDICATIONS: Reviewed  Patient's Medications  New Prescriptions   No medications on file  Previous Medications   ASPIRIN 81 MG TABLET    Take 1 tablet (81 mg total) by mouth daily. Aspirin/Plavix for 3 months, after that Plavix alone   ATORVASTATIN (LIPITOR) 40 MG TABLET    Take 40 mg  by mouth daily.   BENZTROPINE (COGENTIN) 1 MG TABLET    Take 1 tablet (1 mg total) by mouth daily.   CLOPIDOGREL (PLAVIX) 75 MG TABLET    Take 1 tablet (75 mg total) by mouth daily. Aspirin/Plavix for 3 months, after that Plavix alone   ESOMEPRAZOLE (NEXIUM) 40 MG CAPSULE    Take 40 mg by mouth daily.   GLIPIZIDE (GLUCOTROL XL) 10 MG 24 HR TABLET    Take 10 mg by mouth daily.   HYDROCODONE-ACETAMINOPHEN (NORCO/VICODIN) 5-325 MG TABLET    Take 1 tablet by mouth every 6 (six) hours as needed for moderate pain or severe pain.   LORAZEPAM (ATIVAN) 0.5 MG TABLET    Take 1 tablet (0.5 mg total) by mouth at bedtime as needed for anxiety.   PIOGLITAZONE (ACTOS) 45 MG TABLET    Take 45 mg by mouth daily.   RISPERIDONE (RISPERDAL) 0.5 MG TABLET    Take 1 tablet (0.5 mg total) by mouth at bedtime.   TAMSULOSIN (FLOMAX) 0.4 MG CAPS CAPSULE    Take 1 capsule (0.4 mg total) by mouth daily.  Modified Medications   No medications on file  Discontinued Medications   No medications on file     Allergies  Allergen Reactions  . Ibuprofen Other (See Comments)    GI Bleeding     REVIEW OF SYSTEMS:  GENERAL: no change in appetite, no fatigue,  no weight changes, no fever, chills or weakness EYES: Denies change in vision, dry eyes, eye pain, itching or discharge EARS: Denies change in hearing, ringing in ears, or earache NOSE: Denies nasal congestion or epistaxis MOUTH and THROAT: Denies oral discomfort, gingival pain or bleeding, pain from teeth or hoarseness   RESPIRATORY: no cough, SOB, DOE, wheezing, hemoptysis CARDIAC: no chest pain, edema or palpitations GI: no abdominal pain, diarrhea, constipation, heart burn, nausea or vomiting GU: Denies dysuria, frequency, hematuria, incontinence, or discharge PSYCHIATRIC: Denies feeling of depression or anxiety. No report of hallucinations, insomnia, paranoia, or agitation   PHYSICAL EXAMINATION  GENERAL APPEARANCE: Well nourished. In no acute distress.  Normal body habitus HEAD: Normal in size and contour. No evidence of trauma EYES: Lids open and close normally. No blepharitis, entropion or ectropion. PERRL. Conjunctivae are clear and sclerae are white. Lenses are without opacity EARS: Pinnae are normal. Patient hears normal voice tunes of the examiner MOUTH and THROAT: Lips are without lesions. Oral mucosa is moist and without lesions. Tongue is normal in shape, size, and color and without lesions NECK: supple, trachea midline, no neck masses, no thyroid tenderness, no thyromegaly LYMPHATICS: no LAN in the neck, no supraclavicular LAN RESPIRATORY: breathing is even & unlabored, BS CTAB CARDIAC: RRR, no murmur,no extra heart sounds, no edema GI: abdomen soft, normal BS, no masses, no tenderness, no hepatomegaly, no splenomegaly GU:  Has foley catheter draining to urine bag EXTREMITIES:  Able to move 4 extremities PSYCHIATRIC: Alert and oriented X 3. Affect and behavior are appropriate  LABS/RADIOLOGY: Labs reviewed:  07/02/15  WBC 9.0 hemoglobin 11.9 hematocrit 35.9 MCV 88.2 platelet 241 07/01/15  WBC 7.0 hemoglobin 11.1 hematocrit 33.2 MCV 87.1 platelet 222 sodium 144 potassium 3.5 glucose 104 BUN 20 TSH 2.001 09/22/14  Sodium 139 potassium 4.1 glucose 272 BUN 22 creatinine 1.56 calcium 8.6 WBC 8.5 hemoglobin 11.9 hematocrit 36.0 MCV 88 platelet 231 Basic Metabolic Panel:  Recent Labs  82/95/62 1927 06/18/15 0248 06/19/15 0657  NA 143 142 142  K 3.1* 2.9* 3.5  CL 106 102 106  CO2 GLUCOSE 181* 166* 128*  BUN 33* 25* 22*  CREATININE 1.57* 1.54* 1.26*  CALCIUM 8.8* 8.6* 8.6*   Liver Function Tests:  Recent Labs  06/18/15 0248  AST 40  ALT 17  ALKPHOS 73  BILITOT 0.9  PROT 6.2*  ALBUMIN 3.3*    CBC:  Recent Labs  05/14/15 1830 06/17/15 1927 06/18/15 0248  WBC 10.4 8.0 7.4  NEUTROABS 7.8* 4.8  --   HGB 13.9 13.2 13.0  HCT 39.7 37.6* 37.2*  MCV 85.9 85.8 85.9  PLT 161 183 147*   A1C: Lipid  Panel:  Recent Labs  06/18/15 0248  HDL 29*   Cardiac Enzymes:  Recent Labs  06/18/15 1216 06/20/15 0522  CKTOTAL 560* 108  TROPONINI <0.03  --    CBG:  Recent Labs  06/19/15 2144 06/20/15 0615 06/20/15 1113  GLUCAP 157* 202* 212*      No results found.  ASSESSMENT/PLAN:  CVA - for home health PT, OT and nurse aide; follow-up with Dr. Pearlean Brownie, neurology, in 1 month; continue aspirin 81 mg by mouth every 81 mg daily X 2 months and Plavix 81 mg by mouth daily,   Diabetes mellitus type 2 - hemoglobin A1c 7.0; continue glipizide 10 mg 24 hour 1 tab by mouth daily Actos 45 mg 1 tab by mouth daily  Hypertension - well-controlled; continue losartan 100 mg 1 tab  by mouth daily  CKD stage III - creatinine 1.56; stable  CAD S/P stent - continue aspirin 81 mg 1 tab by mouth daily  Dementia with behavioral disturbance - continue Risperdal 0.5 mg 1 tab by mouth daily at bedtime and Cogentin 100 mg by mouth daily at bedtime  BPH with urinary retention -recently started on Flomax 0.4 mg 1 capsule by mouth daily; follow-up with urology  GERD - continue Nexium 40 mg 1 capsule by mouth daily  Anxiety - mood is stable; continue Ativan 0.5 mg 1 tab PO Q HS PRN    I have filled out patient's discharge paperwork and written prescriptions.  Patient will receive home health PT, OT and Nurse's aide.  DME provided:  Standard wheelchair with cushion, semi-electric hospital bed and 3 in 1 bedside commode  Total discharge time: Greater than 30 minutes  Discharge time involved coordination of the discharge process with social worker, nursing staff and therapy department. Medical justification for home health services/DME verified.    [facility] LPMEDINA-VARGAS,Talley Casco, NP [facility] [phone]

## 2015-07-30 ENCOUNTER — Emergency Department (HOSPITAL_COMMUNITY): Payer: Medicare Other

## 2015-07-30 ENCOUNTER — Encounter (HOSPITAL_COMMUNITY): Payer: Self-pay | Admitting: *Deleted

## 2015-07-30 ENCOUNTER — Ambulatory Visit: Payer: 59 | Admitting: Neurology

## 2015-07-30 ENCOUNTER — Inpatient Hospital Stay (HOSPITAL_COMMUNITY)
Admission: EM | Admit: 2015-07-30 | Discharge: 2015-08-05 | DRG: 065 | Disposition: A | Discharge status: Skilled Nursing Facility | Payer: Medicare Other | Attending: Student in an Organized Health Care Education/Training Program | Admitting: Student in an Organized Health Care Education/Training Program

## 2015-07-30 DIAGNOSIS — S065XAA Traumatic subdural hemorrhage with loss of consciousness status unknown, initial encounter: Secondary | ICD-10-CM

## 2015-07-30 DIAGNOSIS — E1142 Type 2 diabetes mellitus with diabetic polyneuropathy: Secondary | ICD-10-CM | POA: Diagnosis present

## 2015-07-30 DIAGNOSIS — Z7902 Long term (current) use of antithrombotics/antiplatelets: Secondary | ICD-10-CM | POA: Diagnosis not present

## 2015-07-30 DIAGNOSIS — N400 Enlarged prostate without lower urinary tract symptoms: Secondary | ICD-10-CM | POA: Diagnosis present

## 2015-07-30 DIAGNOSIS — N183 Chronic kidney disease, stage 3 (moderate): Secondary | ICD-10-CM | POA: Diagnosis present

## 2015-07-30 DIAGNOSIS — Z886 Allergy status to analgesic agent status: Secondary | ICD-10-CM | POA: Diagnosis not present

## 2015-07-30 DIAGNOSIS — Z7982 Long term (current) use of aspirin: Secondary | ICD-10-CM

## 2015-07-30 DIAGNOSIS — Z8673 Personal history of transient ischemic attack (TIA), and cerebral infarction without residual deficits: Secondary | ICD-10-CM | POA: Diagnosis not present

## 2015-07-30 DIAGNOSIS — Z79899 Other long term (current) drug therapy: Secondary | ICD-10-CM

## 2015-07-30 DIAGNOSIS — E119 Type 2 diabetes mellitus without complications: Secondary | ICD-10-CM

## 2015-07-30 DIAGNOSIS — F0391 Unspecified dementia with behavioral disturbance: Secondary | ICD-10-CM | POA: Diagnosis not present

## 2015-07-30 DIAGNOSIS — F0390 Unspecified dementia without behavioral disturbance: Secondary | ICD-10-CM | POA: Diagnosis present

## 2015-07-30 DIAGNOSIS — I2510 Atherosclerotic heart disease of native coronary artery without angina pectoris: Secondary | ICD-10-CM | POA: Diagnosis present

## 2015-07-30 DIAGNOSIS — I6201 Nontraumatic acute subdural hemorrhage: Principal | ICD-10-CM | POA: Diagnosis present

## 2015-07-30 DIAGNOSIS — I129 Hypertensive chronic kidney disease with stage 1 through stage 4 chronic kidney disease, or unspecified chronic kidney disease: Secondary | ICD-10-CM | POA: Diagnosis present

## 2015-07-30 DIAGNOSIS — Z955 Presence of coronary angioplasty implant and graft: Secondary | ICD-10-CM | POA: Diagnosis not present

## 2015-07-30 DIAGNOSIS — Z823 Family history of stroke: Secondary | ICD-10-CM

## 2015-07-30 DIAGNOSIS — I6203 Nontraumatic chronic subdural hemorrhage: Secondary | ICD-10-CM | POA: Diagnosis present

## 2015-07-30 DIAGNOSIS — E1122 Type 2 diabetes mellitus with diabetic chronic kidney disease: Secondary | ICD-10-CM | POA: Diagnosis not present

## 2015-07-30 DIAGNOSIS — I6200 Nontraumatic subdural hemorrhage, unspecified: Secondary | ICD-10-CM | POA: Diagnosis not present

## 2015-07-30 DIAGNOSIS — Z833 Family history of diabetes mellitus: Secondary | ICD-10-CM | POA: Diagnosis not present

## 2015-07-30 DIAGNOSIS — Z7984 Long term (current) use of oral hypoglycemic drugs: Secondary | ICD-10-CM

## 2015-07-30 DIAGNOSIS — Z66 Do not resuscitate: Secondary | ICD-10-CM | POA: Diagnosis present

## 2015-07-30 DIAGNOSIS — Z87891 Personal history of nicotine dependence: Secondary | ICD-10-CM | POA: Diagnosis not present

## 2015-07-30 DIAGNOSIS — S065X9A Traumatic subdural hemorrhage with loss of consciousness of unspecified duration, initial encounter: Secondary | ICD-10-CM

## 2015-07-30 HISTORY — DX: Cerebral infarction, unspecified: I63.9

## 2015-07-30 LAB — COMPREHENSIVE METABOLIC PANEL
ALT: 14 U/L — AB (ref 17–63)
AST: 18 U/L (ref 15–41)
Albumin: 2.7 g/dL — ABNORMAL LOW (ref 3.5–5.0)
Alkaline Phosphatase: 97 U/L (ref 38–126)
Anion gap: 6 (ref 5–15)
BUN: 16 mg/dL (ref 6–20)
CHLORIDE: 108 mmol/L (ref 101–111)
CO2: 26 mmol/L (ref 22–32)
Calcium: 8.3 mg/dL — ABNORMAL LOW (ref 8.9–10.3)
Creatinine, Ser: 1.27 mg/dL — ABNORMAL HIGH (ref 0.61–1.24)
GFR, EST AFRICAN AMERICAN: 57 mL/min — AB (ref >60)
GFR, EST NON AFRICAN AMERICAN: 49 mL/min — AB (ref >60)
GLUCOSE: 130 mg/dL — AB (ref 65–99)
POTASSIUM: 3.8 mmol/L (ref 3.5–5.1)
SODIUM: 140 mmol/L (ref 135–145)
Total Bilirubin: 0.5 mg/dL (ref 0.3–1.2)
Total Protein: 5.8 g/dL — ABNORMAL LOW (ref 6.5–8.1)

## 2015-07-30 LAB — URINE MICROSCOPIC-ADD ON
BACTERIA UA: NONE SEEN
SQUAMOUS EPITHELIAL / LPF: NONE SEEN

## 2015-07-30 LAB — DIFFERENTIAL
BASOS PCT: 1 %
Basophils Absolute: 0 10*3/uL (ref 0.0–0.1)
EOS ABS: 0.6 10*3/uL (ref 0.0–0.7)
EOS PCT: 10 %
Lymphocytes Relative: 24 %
Lymphs Abs: 1.4 10*3/uL (ref 0.7–4.0)
MONO ABS: 0.4 10*3/uL (ref 0.1–1.0)
Monocytes Relative: 7 %
NEUTROS ABS: 3.4 10*3/uL (ref 1.7–7.7)
Neutrophils Relative %: 58 %

## 2015-07-30 LAB — URINALYSIS, ROUTINE W REFLEX MICROSCOPIC
BILIRUBIN URINE: NEGATIVE
Glucose, UA: NEGATIVE mg/dL
HGB URINE DIPSTICK: NEGATIVE
Ketones, ur: NEGATIVE mg/dL
Nitrite: NEGATIVE
PH: 6 (ref 5.0–8.0)
Protein, ur: NEGATIVE mg/dL
SPECIFIC GRAVITY, URINE: 1.017 (ref 1.005–1.030)

## 2015-07-30 LAB — I-STAT TROPONIN, ED: TROPONIN I, POC: 0 ng/mL (ref 0.00–0.08)

## 2015-07-30 LAB — CBC
HEMATOCRIT: 30.2 % — AB (ref 39.0–52.0)
Hemoglobin: 10.1 g/dL — ABNORMAL LOW (ref 13.0–17.0)
MCH: 28.9 pg (ref 26.0–34.0)
MCHC: 33.4 g/dL (ref 30.0–36.0)
MCV: 86.5 fL (ref 78.0–100.0)
Platelets: 140 10*3/uL — ABNORMAL LOW (ref 150–400)
RBC: 3.49 MIL/uL — ABNORMAL LOW (ref 4.22–5.81)
RDW: 13.2 % (ref 11.5–15.5)
WBC: 5.5 10*3/uL (ref 4.0–10.5)

## 2015-07-30 LAB — CBG MONITORING, ED: Glucose-Capillary: 121 mg/dL — ABNORMAL HIGH (ref 65–99)

## 2015-07-30 LAB — PROTIME-INR
INR: 1.1 (ref 0.00–1.49)
PROTHROMBIN TIME: 14.4 s (ref 11.6–15.2)

## 2015-07-30 LAB — RAPID URINE DRUG SCREEN, HOSP PERFORMED
Amphetamines: NOT DETECTED
BARBITURATES: NOT DETECTED
Benzodiazepines: NOT DETECTED
COCAINE: NOT DETECTED
Opiates: NOT DETECTED
Tetrahydrocannabinol: NOT DETECTED

## 2015-07-30 LAB — GLUCOSE, CAPILLARY
Glucose-Capillary: 80 mg/dL (ref 65–99)
Glucose-Capillary: 80 mg/dL (ref 65–99)

## 2015-07-30 LAB — ETHANOL: Alcohol, Ethyl (B): <5 mg/dL (ref <5)

## 2015-07-30 LAB — APTT: APTT: 33 s (ref 24–37)

## 2015-07-30 LAB — MRSA PCR SCREENING: MRSA by PCR: NEGATIVE

## 2015-07-30 MED ORDER — INSULIN ASPART 100 UNIT/ML ~~LOC~~ SOLN
0.0000 [IU] | Freq: Every day | SUBCUTANEOUS | Status: DC
Start: 2015-07-30 — End: 2015-08-05

## 2015-07-30 MED ORDER — RISPERIDONE 0.5 MG PO TABS
0.5000 mg | ORAL_TABLET | Freq: Every day | ORAL | Status: DC
  Administered 2015-07-30 – 2015-08-04 (×6): 0.5 mg via ORAL
  Filled 2015-07-30 (×6): qty 1

## 2015-07-30 MED ORDER — LEVETIRACETAM 500 MG PO TABS
500.0000 mg | ORAL_TABLET | Freq: Two times a day (BID) | ORAL | Status: DC
  Administered 2015-07-30 – 2015-08-05 (×11): 500 mg via ORAL
  Filled 2015-07-30 (×11): qty 1

## 2015-07-30 MED ORDER — SODIUM CHLORIDE 0.9 % IJ SOLN
3.0000 mL | Freq: Two times a day (BID) | INTRAMUSCULAR | Status: DC
  Administered 2015-07-30 – 2015-08-05 (×10): 3 mL via INTRAVENOUS

## 2015-07-30 MED ORDER — INSULIN ASPART 100 UNIT/ML ~~LOC~~ SOLN
0.0000 [IU] | Freq: Three times a day (TID) | SUBCUTANEOUS | Status: DC
  Administered 2015-08-01: 2 [IU] via SUBCUTANEOUS
  Administered 2015-08-01 – 2015-08-02 (×2): 1 [IU] via SUBCUTANEOUS
  Administered 2015-08-02: 2 [IU] via SUBCUTANEOUS
  Administered 2015-08-02 – 2015-08-03 (×3): 1 [IU] via SUBCUTANEOUS
  Administered 2015-08-03: 2 [IU] via SUBCUTANEOUS
  Administered 2015-08-04 (×2): 1 [IU] via SUBCUTANEOUS
  Administered 2015-08-05 (×2): 2 [IU] via SUBCUTANEOUS

## 2015-07-30 MED ORDER — ATORVASTATIN CALCIUM 40 MG PO TABS
40.0000 mg | ORAL_TABLET | Freq: Every day | ORAL | Status: DC
  Administered 2015-07-30 – 2015-08-05 (×7): 40 mg via ORAL
  Filled 2015-07-30 (×8): qty 1

## 2015-07-30 MED ORDER — PANTOPRAZOLE SODIUM 40 MG PO TBEC
40.0000 mg | DELAYED_RELEASE_TABLET | Freq: Every day | ORAL | Status: DC
  Administered 2015-07-30 – 2015-08-05 (×7): 40 mg via ORAL
  Filled 2015-07-30 (×8): qty 1

## 2015-07-30 NOTE — ED Notes (Signed)
CBG= 121

## 2015-07-30 NOTE — ED Notes (Signed)
Per EMS- pt was not responding appropriately when wife went to wake pt this morning at 0830. Pt not following commands initially. Pt wife reported that his  eye was twitching. EMS reports that pt became more responsive during transport.

## 2015-07-30 NOTE — ED Provider Notes (Signed)
CSN: [id number]     Arrival date & time 07/30/15  [id number] History   First MD Initiated Contact with Patient 07/30/15 1007     Chief Complaint  Patient presents with  . Altered Mental Status   (Consider location/radiation/quality/duration/timing/severity/associated sxs/prior Treatment) The history is provided by the patient and the spouse.   Patient is a 81 year old man with history of DM, HTN, dementia, and prior CVA presenting with altered mental status. The patient's wife reports that she found the patient sleeping this morning with his mouth open. Reports that the patient was much more difficult to arouse than normal. He was admitted to the hospital last month for a stroke and the wife was worried that he may have had another stroke. Wife reports that the patient is verbal at baseline and follows commands, though sometimes his words are nonsensical. The patient is unable to ambulate at baseline. She denies any recent illness. No fevers or chills. No nausea or vomiting. No diarrhea. On arrival to the ED, the wife felt like the patient had improved some, but was not yet back to his baseline.   Past Medical History  Diagnosis Date  . Diabetes mellitus without complication (HCC)   . TIA (transient ischemic attack)   . Neuropathy (HCC)   . Status post dilation of esophageal narrowing   . Hypertension   . Stented coronary artery   . Stroke [facility](HCC)    Past Surgical History  Procedure Laterality Date  . Cardiac catheterization    . Hernia repair    . Prostate surgery    . Eye surgery    . Coronary angioplasty    . Esphageal dilation     Family History  Problem Relation Age of Onset  . Diabetes Mellitus II Mother   . Stroke Father   . Diabetes Mellitus II Sister    Social History  Substance Use Topics  . Smoking status: Former Games developermoker  . Smokeless tobacco: None  . Alcohol Use: Yes     Comment: daily    Review of Systems  Unable to perform ROS: Mental status change   Allergies   Ibuprofen  Home Medications   Prior to Admission medications   Medication Sig Start Date End Date Taking? Authorizing Provider  aspirin 81 MG tablet Take 1 tablet (81 mg total) by mouth daily. Aspirin/Plavix for 3 months, after that Plavix alone 06/20/15  Yes Shanker Levora DredgeM Ghimire, MD  atorvastatin (LIPITOR) 40 MG tablet Take 40 mg by mouth daily. 03/10/15 03/09/16 Yes Historical Provider, MD  clopidogrel (PLAVIX) 75 MG tablet Take 1 tablet (75 mg total) by mouth daily. Aspirin/Plavix for 3 months, after that Plavix alone 06/20/15  Yes Shanker Levora DredgeM Ghimire, MD  glipiZIDE (GLUCOTROL XL) 10 MG 24 hr tablet Take 10 mg by mouth daily. 03/05/15  Yes Historical Provider, MD  LORazepam (ATIVAN) 0.5 MG tablet Take 1 tablet (0.5 mg total) by mouth at bedtime as needed for anxiety. 06/25/15  Yes Kimber RelicArthur G Green, MD  Melatonin 10 MG TABS Take 10 mg by mouth every evening.   Yes Historical Provider, MD  omeprazole (PRILOSEC) 20 MG capsule Take 20 mg by mouth daily.   Yes Historical Provider, MD  risperiDONE (RISPERDAL) 0.5 MG tablet Take 1 tablet (0.5 mg total) by mouth at bedtime. 06/20/15  Yes Shanker Levora DredgeM Ghimire, MD  benztropine (COGENTIN) 1 MG tablet Take 1 tablet (1 mg total) by mouth daily. Patient not taking: Reported on 07/30/2015 06/20/15   Maretta BeesShanker M Ghimire, MD  tamsulosin (FLOMAX) 0.4 MG CAPS capsule Take 1 capsule (0.4 mg total) by mouth daily. Patient not taking: Reported on 07/30/2015 06/20/15   Maretta Bees, MD   BP 157/66 mmHg  Pulse 71  Temp(Src) 98.1 F (36.7 C) (Oral)  Resp 17  SpO2 98% Physical Exam  Constitutional: He appears well-developed and well-nourished. No distress.  HENT:  Head: Normocephalic and atraumatic.  Eyes: EOM are normal. Pupils are equal, round, and reactive to light.  Neck: Normal range of motion. Neck supple.  Cardiovascular: Normal rate, regular rhythm and normal heart sounds.   No murmur heard. Pulmonary/Chest: Effort normal and breath sounds normal. No  respiratory distress. He has no wheezes.  Abdominal: Soft. Bowel sounds are normal. He exhibits no distension. There is no tenderness.  Musculoskeletal: Normal range of motion. He exhibits no edema.  Neurological: He is alert. He has normal strength. He is disoriented (not oriented to time). No cranial nerve deficit.  Patient follows some commands. Physical exam limited by mental status.   Nursing note and vitals reviewed.   ED Course  Procedures (including critical care time) Labs Review Labs Reviewed  COMPREHENSIVE METABOLIC PANEL - Abnormal; Notable for the following:    Glucose, Bld 130 (*)    Creatinine, Ser 1.27 (*)    Calcium 8.3 (*)    Total Protein 5.8 (*)    Albumin 2.7 (*)    ALT 14 (*)    GFR calc non Af Amer 49 (*)    GFR calc Af Amer 57 (*)    All other components within normal limits  CBC - Abnormal; Notable for the following:    RBC 3.49 (*)    Hemoglobin 10.1 (*)    HCT 30.2 (*)    Platelets 140 (*)    All other components within normal limits  CBG MONITORING, ED - Abnormal; Notable for the following:    Glucose-Capillary 121 (*)    All other components within normal limits  ETHANOL  PROTIME-INR  APTT  DIFFERENTIAL  URINALYSIS, ROUTINE W REFLEX MICROSCOPIC (NOT AT [facility])  URINE RAPID DRUG SCREEN, HOSP PERFORMED  I-STAT TROPOININ, ED    Imaging Review Ct Head Wo Contrast  07/30/2015  CLINICAL DATA:  Altered mental status.  LEFT eye twitching. EXAM: CT HEAD WITHOUT CONTRAST TECHNIQUE: Contiguous axial images were obtained from the base of the skull through the vertex without intravenous contrast. COMPARISON:  Brain MRI 06/17/2015, head CT 09/21/2011 FINDINGS: There is a an ovoid high-density extra-axial fluid collection along the LEFT parietal lobe measuring 3.2 cm by 1.3 cm (image 21, series 2). There is a larger low-density extra-axial fluid collection extending along the entirety of the LEFT cerebral hemisphere. Small amount of high-density fluid along the  LEFT cerebellar tentorium additionally. There is mild rightward midline shift of 9 mm. There is mild compression of the LEFT lateral ventricle. There is generalized cortical atrophy. There is extensive periventricular white matter hypodensities. Paranasal sinuses and  mastoid air cells are clear. IMPRESSION: 1. Acute LEFT extra-axial hemorrhage (likely subdural) superimposed on a subacute LEFT subdural hematoma. Both findings are new from MRI of 06/18/2015. 2. Rightward midline shift of 9 mm. Mild compression of the LEFT lateral ventricle. 3. Small amount of subdural blood along the LEFT tentorium. 4. Extensive atrophy and white matter microvascular disease. Critical Value/emergent results were called by telephone at the time of interpretation on 07/30/2015 at 11:29 am to Dr. Linwood Dibbles , who verbally acknowledged these results. Electronically Signed   By: Roseanne Reno  Amil Amen M.D.   On: 07/30/2015 11:30   I have personally reviewed and evaluated these images and lab results as part of my medical decision-making.   EKG Interpretation None      MDM   Final diagnoses:  Subdural hematoma (HCC)   10:31 AM Patient is an 81 year old man with history of DM, HTN, dementia, and prior CVA presenting with lethargy since this morning. Neurological exam is nonfocal. Mental status is slowly improving. Will check basic labs, ethanol, UA, UDS, and head CT.   11:59 AM Head CT with new left subdural hematomas with midline shift. Discussed case with neurosurgery, will see and exam patient.  Request admission to medicine.   12:27 PM Discussed case with internal medicine teaching service. Will admit patient.  Ardith Dark, MD 07/30/15 1228  Linwood Dibbles, MD 07/30/15 [phone]

## 2015-07-30 NOTE — Consult Note (Signed)
Reason for Consult: Subdural hematoma Referring Physician: Emergency department  HPI: Emergency department patient is a 81 year old gentleman has had a [place] history of recent strokes discharged hospital over a month ago and sent to [facility] for rehabilitation. Upon discharge and during saucerization was placed on aspirin and Plavix for his TIA and stroke management. Patient did have a few falls a [place] place over a month ago no recent falls anybody's aware of he has now been home for 2 weeks. He was found this morning and not responding to his wife EMS was called patient brought to the emerge department. Currently the patient is awake and conversant not complaining of any significant headache numbness or tingling in his arms or his legs.    Past Medical History  Diagnosis Date  . Diabetes mellitus without complication ([place])   . TIA (transient ischemic attack)   . Neuropathy ([place])   . Status post dilation of esophageal narrowing   . Hypertension   . Stented coronary artery   . Stroke [facility])     Past Surgical History  Procedure Laterality Date  . Cardiac catheterization    . Hernia repair    . Prostate surgery    . Eye surgery    . Coronary angioplasty    . Esphageal dilation      Family History  Problem Relation Age of Onset  . Diabetes Mellitus II Mother   . Stroke Father   . Diabetes Mellitus II Sister     Social History:  reports that he has quit smoking. He does not have any smokeless tobacco history on file. He reports that he drinks alcohol. He reports that he does not use illicit drugs.  Allergies:  Allergies  Allergen Reactions  . Ibuprofen Other (See Comments)    GI Bleeding    Medications: I have reviewed the patient's current medications.  Results for orders placed or performed during the hospital encounter of 07/30/15 (from the past 48 hour(s))  Comprehensive metabolic panel     Status: Abnormal   Collection Time: 07/30/15 10:21 AM  Result Value  Ref Range   Sodium 140 135 - 145 mmol/L   Potassium 3.8 3.5 - 5.1 mmol/L   Chloride 108 101 - 111 mmol/L   CO2 26 22 - 32 mmol/L   Glucose, Bld 130 (H) 65 - 99 mg/dL   BUN 16 6 - 20 mg/dL   Creatinine, Ser 1.27 (H) 0.61 - 1.24 mg/dL   Calcium 8.3 (L) 8.9 - 10.3 mg/dL   Total Protein 5.8 (L) 6.5 - 8.1 g/dL   Albumin 2.7 (L) 3.5 - 5.0 g/dL   AST 18 15 - 41 U/L   ALT 14 (L) 17 - 63 U/L   Alkaline Phosphatase 97 38 - 126 U/L   Total Bilirubin 0.5 0.3 - 1.2 mg/dL   GFR calc non Af Amer 49 (L) >60 mL/min   GFR calc Af Amer 57 (L) >60 mL/min    Comment: (NOTE) The eGFR has been calculated using the CKD EPI equation. This calculation has not been validated in all clinical situations. eGFR's persistently <60 mL/min signify possible Chronic Kidney Disease.    Anion gap 6 5 - 15  CBC     Status: Abnormal   Collection Time: 07/30/15 10:21 AM  Result Value Ref Range   WBC 5.5 4.0 - 10.5 K/uL   RBC 3.49 (L) 4.22 - 5.81 MIL/uL   Hemoglobin 10.1 (L) 13.0 - 17.0 g/dL   HCT 30.2 (L)  39.0 - 52.0 %   MCV 86.5 78.0 - 100.0 fL   MCH 28.9 26.0 - 34.0 pg   MCHC 33.4 30.0 - 36.0 g/dL   RDW 13.2 11.5 - 15.5 %   Platelets 140 (L) 150 - 400 K/uL  Protime-INR     Status: None   Collection Time: 07/30/15 10:30 AM  Result Value Ref Range   Prothrombin Time 14.4 11.6 - 15.2 seconds   INR 1.10 0.00 - 1.49  APTT     Status: None   Collection Time: 07/30/15 10:30 AM  Result Value Ref Range   aPTT 33 24 - 37 seconds  Differential     Status: None   Collection Time: 07/30/15 10:30 AM  Result Value Ref Range   Neutrophils Relative % 58 %   Neutro Abs 3.4 1.7 - 7.7 K/uL   Lymphocytes Relative 24 %   Lymphs Abs 1.4 0.7 - 4.0 K/uL   Monocytes Relative 7 %   Monocytes Absolute 0.4 0.1 - 1.0 K/uL   Eosinophils Relative 10 %   Eosinophils Absolute 0.6 0.0 - 0.7 K/uL   Basophils Relative 1 %   Basophils Absolute 0.0 0.0 - 0.1 K/uL  I-stat troponin, ED (not at [facility])     Status: None   Collection  Time: 07/30/15 10:45 AM  Result Value Ref Range   Troponin i, poc 0.00 0.00 - 0.08 ng/mL   Comment 3            Comment: Due to the release kinetics of cTnI, a negative result within the first hours of the onset of symptoms does not rule out myocardial infarction with certainty. If myocardial infarction is still suspected, repeat the test at appropriate intervals.   Ethanol     Status: None   Collection Time: 07/30/15 10:47 AM  Result Value Ref Range   Alcohol, Ethyl (B) <5 <5 mg/dL    Comment:        LOWEST DETECTABLE LIMIT FOR SERUM ALCOHOL IS 5 mg/dL FOR MEDICAL PURPOSES ONLY   CBG monitoring, ED     Status: Abnormal   Collection Time: 07/30/15 11:05 AM  Result Value Ref Range   Glucose-Capillary 121 (H) 65 - 99 mg/dL    Ct Head Wo Contrast  07/30/2015  CLINICAL DATA:  Altered mental status.  LEFT eye twitching. EXAM: CT HEAD WITHOUT CONTRAST TECHNIQUE: Contiguous axial images were obtained from the base of the skull through the vertex without intravenous contrast. COMPARISON:  Brain MRI 06/17/2015, head CT 09/21/2011 FINDINGS: There is a an ovoid high-density extra-axial fluid collection along the LEFT parietal lobe measuring 3.2 cm by 1.3 cm (image 21, series 2). There is a larger low-density extra-axial fluid collection extending along the entirety of the LEFT cerebral hemisphere. Small amount of high-density fluid along the LEFT cerebellar tentorium additionally. There is mild rightward midline shift of 9 mm. There is mild compression of the LEFT lateral ventricle. There is generalized cortical atrophy. There is extensive periventricular white matter hypodensities. Paranasal sinuses and  mastoid air cells are clear. IMPRESSION: 1. Acute LEFT extra-axial hemorrhage (likely subdural) superimposed on a subacute LEFT subdural hematoma. Both findings are new from MRI of 06/18/2015. 2. Rightward midline shift of 9 mm. Mild compression of the LEFT lateral ventricle. 3. Small amount of  subdural blood along the LEFT tentorium. 4. Extensive atrophy and white matter microvascular disease. Critical Value/emergent results were called by telephone at the time of interpretation on 07/30/2015 at 11:29 am to Dr.  JON KNAPP , who verbally acknowledged these results. Electronically Signed   By: Suzy Bouchard M.D.   On: 07/30/2015 11:30    Review of Systems  Unable to perform ROS  Blood pressure 144/56, pulse 67, temperature 98.1 F (36.7 C), temperature source Oral, resp. rate 15, SpO2 98 %. Physical Exam  Constitutional: He appears well-developed and well-nourished.  HENT:  Head: Normocephalic.  Eyes: Pupils are equal, round, and reactive to light.  Neck: Normal range of motion.  Cardiovascular: Normal rate.   Respiratory: Effort normal.  GI: Soft. Bowel sounds are normal.  Neurological: He is alert. He displays a negative Romberg sign. GCS eye subscore is 4. GCS verbal subscore is 5. GCS motor subscore is 6.  Patient is awake alert pupils are equal extraocular movements are intact cranial nerves appear to be intact he is confused he follows commands with great encouragement and prodding he does appear to have a left more hemi-neglect I do not think he has an overt left hemiparesis. Moves all extremities right greater than left.    Assessment/Plan: 81 year old and presents for an acute on chronic subdural hematoma with mild mass effect. Most of the mass effect appears to be local he has a small amount of midline shift but he has a dense and extensive medical cortical atrophy and a history of patient with multiple strokes and on Plavix immediately prior to coming to the hospital. Patient is neurologically stable he has some confusion and I think he had a seizure this morning started recommend Keppra at 500 twice a day for seizure management. I would recommend neurology consult for evaluation management of his history of strokes now when we cannot use Plavix and given determination of  his risk off of Plavix. I would not entertain any burr hole evacuation of this clot at this point and would prefer to wait at least 3-5 days off Plavix and would prefer not to have to operate on this at all in this patient with multiple medical problems.  Fotini Lemus P 07/30/2015, 12:58 PM

## 2015-07-30 NOTE — H&P (Signed)
Date: 07/30/2015               Patient Name:  Parker Hunter MRN: [id number]  DOB: Aug 16, 1927 Age / Sex: [age] y.o., male   PCP: No primary care provider on file.         Medical Service: Internal Medicine Teaching Service         Attending Physician: Dr. Tyson Aliasuncan Thomas Vincent, MD    First Contact: Dr. Dimple Caseyice Pager: [phone]  Second Contact: Dr. Beckie Saltsivet Pager: [phone]       After Hours (After 5p/  First Contact Pager: [phone]  weekends / holidays): Second Contact Pager: [phone]   Chief Complaint: Altered Mental Status  History of Present Illness: Parker Hunter is a 81 y.o. male w/ PMHx of DM type II, h/o left frontal CVA (06/2015), HTN, CKD stage 3, CAD s/p stent placement, and dementia, brought to the ED by his wife for an acute change in his mental status. Patient has a history of dementia and has a recent h/o CVA for which he has some let sided residual weakness, however, the wife states this AM, the patient was minimally responsive for a short time, was not talking like he usually does, and seemed more confused. He denied any discomfort, headaches, vision changes, nausea, dizziness, or lightheadedness. The patient was discharge after his stroke in 06/2015 to SNF. About a month ago during rehab, the patient sustained a fall for which it was thought he bumped his head. No falls reported by the family or the patient since that time. The patient is taking Plavix for his recent stroke and was supposed to be taking ASA as well, however, he has not been taking that since his discharge home from SNF about 2 weeks ago.   CT performed in the ED shows large left hemisphere hypodensity suggestive of an older/subacute subdural hematoma and left parietal hyperdensity suggestive of a new subdural hematoma.   Meds: No current facility-administered medications for this encounter.   Current Outpatient Prescriptions  Medication Sig Dispense Refill  . aspirin 81 MG tablet Take 1 tablet (81 mg total) by  mouth daily. Aspirin/Plavix for 3 months, after that Plavix alone    . atorvastatin (LIPITOR) 40 MG tablet Take 40 mg by mouth daily.    . clopidogrel (PLAVIX) 75 MG tablet Take 1 tablet (75 mg total) by mouth daily. Aspirin/Plavix for 3 months, after that Plavix alone    . glipiZIDE (GLUCOTROL XL) 10 MG 24 hr tablet Take 10 mg by mouth daily.    Marland Kitchen. LORazepam (ATIVAN) 0.5 MG tablet Take 1 tablet (0.5 mg total) by mouth at bedtime as needed for anxiety. 30 tablet 5  . Melatonin 10 MG TABS Take 10 mg by mouth every evening.    Marland Kitchen. omeprazole (PRILOSEC) 20 MG capsule Take 20 mg by mouth daily.    . risperiDONE (RISPERDAL) 0.5 MG tablet Take 1 tablet (0.5 mg total) by mouth at bedtime.    . benztropine (COGENTIN) 1 MG tablet Take 1 tablet (1 mg total) by mouth daily. (Patient not taking: Reported on 07/30/2015)    . tamsulosin (FLOMAX) 0.4 MG CAPS capsule Take 1 capsule (0.4 mg total) by mouth daily. (Patient not taking: Reported on 07/30/2015) 30 capsule     Allergies: Allergies as of 07/30/2015 - Review Complete 07/30/2015  Allergen Reaction Noted  . Ibuprofen Other (See Comments) 05/14/2015   Past Medical History  Diagnosis Date  . Diabetes mellitus without complication (HCC)   . TIA (  transient ischemic attack)   . Neuropathy (HCC)   . Status post dilation of esophageal narrowing   . Hypertension   . Stented coronary artery   . Stroke [facility])    Past Surgical History  Procedure Laterality Date  . Cardiac catheterization    . Hernia repair    . Prostate surgery    . Eye surgery    . Coronary angioplasty    . Esphageal dilation     Family History  Problem Relation Age of Onset  . Diabetes Mellitus II Mother   . Stroke Father   . Diabetes Mellitus II Sister    Social History   Social History  . Marital Status: Married    Spouse Name: N/A  . Number of Children: N/A  . Years of Education: N/A   Occupational History  . Not on file.   Social History Main Topics  . Smoking  status: Former Games developer  . Smokeless tobacco: Not on file  . Alcohol Use: Yes     Comment: daily  . Drug Use: No  . Sexual Activity: Not on file   Other Topics Concern  . Not on file   Social History Narrative    Review of Systems:  General: Denies fever, diaphoresis, appetite change, and fatigue.  Respiratory: Denies SOB, cough, and wheezing.   Cardiovascular: Denies chest pain and palpitations.  Gastrointestinal: Denies nausea, vomiting, abdominal pain, and diarrhea Musculoskeletal: Denies myalgias, arthralgias, back pain, and gait problem.  Neurological: Denies dizziness, syncope, weakness, lightheadedness, and headaches.  Psychiatric/Behavioral: Positive for mood changes. Denies sleep disturbance, and agitation.   Physical Exam: Blood pressure 135/63, pulse 69, temperature 98.1 F (36.7 C), temperature source Oral, resp. rate 13, SpO2 98 %.  General: Elderly white male, alert, cooperative, NAD.  HEENT: PERRL, EOMI. Moist mucus membranes.  Neck: Full range of motion without pain, supple, no lymphadenopathy or carotid bruits Lungs: Clear to ascultation bilaterally, normal work of respiration, no wheezes or rhonchi. Faint rales in teh bases bilaterally.  Heart: RRR, no murmurs, gallops, or rubs Abdomen: Soft, non-tender, non-distended, BS + Extremities: No cyanosis, clubbing, or edema Neurologic: Alert & oriented x2, cranial nerves II-XII intact, strength 4/5 on left upper extremity, some coordination difficulty on the left. Otherwise strength and sensation intact. Does not follow all commands during neuro exam mostly on the left, some questionable mild left-sided neglect.     Lab results: Basic Metabolic Panel:  Recent Labs  09/81/19 1021  NA 140  K 3.8  CL 108  CO2 26  GLUCOSE 130*  BUN 16  CREATININE 1.27*  CALCIUM 8.3*   Liver Function Tests:  Recent Labs  07/30/15 1021  AST 18  ALT 14*  ALKPHOS 97  BILITOT 0.5  PROT 5.8*  ALBUMIN 2.7*    CBC:  Recent Labs  07/30/15 1021 07/30/15 1030  WBC 5.5  --   NEUTROABS  --  3.4  HGB 10.1*  --   HCT 30.2*  --   MCV 86.5  --   PLT 140*  --    CBG:  Recent Labs  07/30/15 1105  GLUCAP 121*   Coagulation:  Recent Labs  07/30/15 1030  LABPROT 14.4  INR 1.10    Alcohol Level:  Recent Labs  07/30/15 1047  ETH <5    Imaging results:  Ct Head Wo Contrast  07/30/2015  CLINICAL DATA:  Altered mental status.  LEFT eye twitching. EXAM: CT HEAD WITHOUT CONTRAST TECHNIQUE: Contiguous axial images were obtained from the  base of the skull through the vertex without intravenous contrast. COMPARISON:  Brain MRI 06/17/2015, head CT 09/21/2011 FINDINGS: There is a an ovoid high-density extra-axial fluid collection along the LEFT parietal lobe measuring 3.2 cm by 1.3 cm (image 21, series 2). There is a larger low-density extra-axial fluid collection extending along the entirety of the LEFT cerebral hemisphere. Small amount of high-density fluid along the LEFT cerebellar tentorium additionally. There is mild rightward midline shift of 9 mm. There is mild compression of the LEFT lateral ventricle. There is generalized cortical atrophy. There is extensive periventricular white matter hypodensities. Paranasal sinuses and  mastoid air cells are clear. IMPRESSION: 1. Acute LEFT extra-axial hemorrhage (likely subdural) superimposed on a subacute LEFT subdural hematoma. Both findings are new from MRI of 06/18/2015. 2. Rightward midline shift of 9 mm. Mild compression of the LEFT lateral ventricle. 3. Small amount of subdural blood along the LEFT tentorium. 4. Extensive atrophy and white matter microvascular disease. Critical Value/emergent results were called by telephone at the time of interpretation on 07/30/2015 at 11:29 am to Dr. Linwood Dibbles , who verbally acknowledged these results. Electronically Signed   By: Genevive Bi M.D.   On: 07/30/2015 11:30    Other results: EKG: NSR, new  lateral TWI in V4, V5, not present on previous EKG. Prolonged QTc  Assessment & Plan by Problem: [name] is a 81 y.o. male w/ PMHx of DM type II, h/o left frontal CVA (06/2015), HTN, CKD stage 3, CAD s/p stent placement, and dementia, admitted for acute on subacute left subdural hematoma.  Subdural Hematoma: Patient with new mental status change this AM per wife. CT head in the ED suggests large left hemisphere subacute subdural hematoma and an acute parietal subdural hematoma. There is 9 mm midline shift as well. Vitals stable currently, no hypertension or bradycardia. Patient had a fall about 12 month ago at SNF, the likely explanation for older hematoma, although no recent falls per patient and family. No headaches or vision changes per patient. Patient also with period of questionable LOC this AM. Also has a h/o recent CVA in 06/2015 for which he was discharged on ASA + Plavix. Patient has been taking the Plavix regularly but has not taken ASA in the past 2 weeks d/t family confusion since patient returned home from SNF. Seen by neurosurgery, no acute intervention at this time, although may require burr hole procedure in future if bleed worsens.  -Admit to neuro ICU for close monitoring -Consult neurology -Start Keppra 500 mg bid for possible seizure -Hold ASA + Plavix -Frequent neuro checks -Bedside RN swallow; if passes, can start carb modified diet -PT, OT evaluation  Recent CVA: Small left frontal infarcts on MRI in 06/2015. Started on ASA + Plavix at that time. Still has some left-sided residual weakness and coordination problems on exam.  -Hold ASA + Plavix for now  CAD s/p Stent: No current chest pain. EKG with new TWI in V4, V5. poc troponin 0.  -Repeat EKG in AM  CKD stage 3: Cr currently close to baseline -Repeat BMP in AM  DVT/PE PPx: SCD's; no PPx given bleed  Code Status: DNR  Dispo: Disposition is deferred at this time, awaiting improvement of current medical  problems. Anticipated discharge in approximately 2-3 day(s).   The patient does have a current PCP (No primary care provider on file.) and does not need an [facility] hospital follow-up appointment after discharge.  The patient does not have transportation limitations that hinder transportation  to clinic appointments.  Signed: Courtney Paris, MD 07/30/2015, 2:01 PM

## 2015-07-30 NOTE — ED Provider Notes (Addendum)
Patient presented to the emergency room this morning with a change in his mental status. Hx of fall in the last month. Patient was hard to wake up and was slow to respond. Patient's symptoms are improving though and he is now more alert speaking. Physical Exam  BP 157/66 mmHg  Pulse 71  Temp(Src) 98.1 F (36.7 C) (Oral)  Resp 17  SpO2 98%  Physical Exam  Constitutional: He appears well-developed and well-nourished. No distress.  HENT:  Head: Normocephalic and atraumatic.  Right Ear: External ear normal.  Left Ear: External ear normal.  Eyes: Conjunctivae are normal. Right eye exhibits no discharge. Left eye exhibits no discharge. No scleral icterus.  Neck: Neck supple. No tracheal deviation present.  Cardiovascular: Normal rate.   Pulmonary/Chest: Effort normal. No stridor. No respiratory distress.  Musculoskeletal: He exhibits no edema.  Neurological: He is alert. He displays no atrophy. Cranial nerve deficit: no slurred speech or facial droop. He exhibits normal muscle tone. Coordination abnormal. GCS eye subscore is 4. GCS verbal subscore is 4. GCS motor subscore is 6.  Skin: Skin is warm and dry. No rash noted.  Psychiatric: He has a normal mood and affect.  Nursing note and vitals reviewed. CRITICAL CARE Performed by: VHQIO,NGEKNAPP,Tiler Brandis Total critical care time: 35 minutes Critical care time was exclusive of separately billable procedures and treating other patients. Critical care was necessary to treat or prevent imminent or life-threatening deterioration. Critical care was time spent personally by me on the following activities: development of treatment plan with patient and/or surrogate as well as nursing, discussions with consultants, evaluation of patient's response to treatment, examination of patient, obtaining history from patient or surrogate, ordering and performing treatments and interventions, ordering and review of laboratory studies, ordering and review of radiographic studies,  pulse oximetry and re-evaluation of patient's condition.   ED Course  Procedures  MDM  CT scan shows an extra axial fluid collection.  Probably an acute on chronic subdural.  Pt is awake and alert, protecting his airway.   I will consult with neurosurgery.  Plan on admission.  Updated family of the status.    Linwood DibblesJon Arney Mayabb, MD 07/30/15 1146  D/w Dr Wynetta Emeryram.  No plans for emergent surgery at this time.  Hold plavix.  Medical admit.  Observe over the next couple of days.  Linwood DibblesJon Kimberlie Csaszar, MD 07/30/15 [phone]

## 2015-07-31 LAB — GLUCOSE, CAPILLARY
GLUCOSE-CAPILLARY: 95 mg/dL (ref 65–99)
GLUCOSE-CAPILLARY: 127 mg/dL — AB (ref 65–99)
Glucose-Capillary: 109 mg/dL — ABNORMAL HIGH (ref 65–99)
Glucose-Capillary: 179 mg/dL — ABNORMAL HIGH (ref 65–99)

## 2015-07-31 LAB — BASIC METABOLIC PANEL
Anion gap: 6 (ref 5–15)
BUN: 13 mg/dL (ref 6–20)
CHLORIDE: 104 mmol/L (ref 101–111)
CO2: 30 mmol/L (ref 22–32)
CREATININE: 1.19 mg/dL (ref 0.61–1.24)
Calcium: 8.8 mg/dL — ABNORMAL LOW (ref 8.9–10.3)
GFR calc non Af Amer: 53 mL/min — ABNORMAL LOW (ref >60)
Glucose, Bld: 92 mg/dL (ref 65–99)
Potassium: 3.9 mmol/L (ref 3.5–5.1)
Sodium: 140 mmol/L (ref 135–145)

## 2015-07-31 LAB — CBC
HEMATOCRIT: 32.3 % — AB (ref 39.0–52.0)
Hemoglobin: 10.8 g/dL — ABNORMAL LOW (ref 13.0–17.0)
MCH: 29 pg (ref 26.0–34.0)
MCHC: 33.4 g/dL (ref 30.0–36.0)
MCV: 86.8 fL (ref 78.0–100.0)
Platelets: 150 10*3/uL (ref 150–400)
RBC: 3.72 MIL/uL — AB (ref 4.22–5.81)
RDW: 13 % (ref 11.5–15.5)
WBC: 5.4 10*3/uL (ref 4.0–10.5)

## 2015-07-31 LAB — APTT: aPTT: 34 seconds (ref 24–37)

## 2015-07-31 LAB — PROTIME-INR
INR: 1.16 (ref 0.00–1.49)
Prothrombin Time: 15 seconds (ref 11.6–15.2)

## 2015-07-31 NOTE — Progress Notes (Signed)
Subjective: No acute events overnight. No change in vital signs. Mental status remains disoriented but in no acute distress. Passed swallow evaluation and resumed PO diet and medications. Patient is pleasant and has no complaints, but limited in discussion and cooperation with exam this morning. Transferred to step down unit from MICU.  Objective: Vital signs in last 24 hours: Filed Vitals:   07/31/15 0820 07/31/15 0900 07/31/15 1141 07/31/15 1653  BP:  162/69 142/63 150/64  Pulse:  79 67 84  Temp: 98 F (36.7 C)  98 F (36.7 C) 97.2 F (36.2 C)  TempSrc: Oral  Oral Oral  Resp:  15 17 18   Weight:   70.3 kg (154 lb 15.7 oz)   SpO2:  95% 97% 98%   Weight change:   Intake/Output Summary (Last 24 hours) at 07/31/15 1809 Last data filed at 07/31/15 1300  Gross per 24 hour  Intake    510 ml  Output      0 ml  Net    510 ml   GENERAL- awake, disoriented, NAD HEENT- Atraumatic, oral mucosa appears moist CARDIAC- RRR, no murmurs, rubs or gallops. RESP- CTAB, no wheezes or crackles. ABDOMEN- Soft, nontender, no guarding or rebound, normoactive bowel sounds present NEURO- exam very limited by participation, RUE strength 5/5, LUE strength 4/5, unable to assess legs EXTREMITIES- symmetric, no contusions or abrasions PSYCH- Flat affect, speech is intelligible but only single words or phrases, and is confused  Lab Results: Basic Metabolic Panel:  Recent Labs Lab 07/30/15 1021 07/31/15 0423  NA 140 140  K 3.8 3.9  CL 108 104  CO2 26 30  GLUCOSE 130* 92  BUN 16 13  CREATININE 1.27* 1.19  CALCIUM 8.3* 8.8*   Liver Function Tests:  Recent Labs Lab 07/30/15 1021  AST 18  ALT 14*  ALKPHOS 97  BILITOT 0.5  PROT 5.8*  ALBUMIN 2.7*   No results for input(s): LIPASE, AMYLASE in the last 168 hours. No results for input(s): AMMONIA in the last 168 hours. CBC:  Recent Labs Lab 07/30/15 1021 07/30/15 1030 07/31/15 0423  WBC 5.5  --  5.4  NEUTROABS  --  3.4  --   HGB  10.1*  --  10.8*  HCT 30.2*  --  32.3*  MCV 86.5  --  86.8  PLT 140*  --  150   Cardiac Enzymes: No results for input(s): CKTOTAL, CKMB, CKMBINDEX, TROPONINI in the last 168 hours. BNP: No results for input(s): PROBNP in the last 168 hours. D-Dimer: No results for input(s): DDIMER in the last 168 hours. CBG:  Recent Labs Lab 07/30/15 1105 07/30/15 1927 07/30/15 2236 07/31/15 0817 07/31/15 1150 07/31/15 1805  GLUCAP 121* 80 80 95 109* 127*   Hemoglobin A1C: No results for input(s): HGBA1C in the last 168 hours. Fasting Lipid Panel: No results for input(s): CHOL, HDL, LDLCALC, TRIG, CHOLHDL, LDLDIRECT in the last 168 hours. Thyroid Function Tests: No results for input(s): TSH, T4TOTAL, FREET4, T3FREE, THYROIDAB in the last 168 hours. Coagulation:  Recent Labs Lab 07/30/15 1030 07/31/15 0423  LABPROT 14.4 15.0  INR 1.10 1.16   Anemia Panel: No results for input(s): VITAMINB12, FOLATE, FERRITIN, TIBC, IRON, RETICCTPCT in the last 168 hours. Urine Drug Screen: Drugs of Abuse     Component Value Date/Time   LABOPIA NONE DETECTED 07/30/2015 1553   COCAINSCRNUR NONE DETECTED 07/30/2015 1553   LABBENZ NONE DETECTED 07/30/2015 1553   AMPHETMU NONE DETECTED 07/30/2015 1553   THCU NONE DETECTED 07/30/2015  1553   LABBARB NONE DETECTED 07/30/2015 1553    Alcohol Level:  Recent Labs Lab 07/30/15 1047  ETH <5   Urinalysis:  Recent Labs Lab 07/30/15 1553  COLORURINE YELLOW  LABSPEC 1.017  PHURINE 6.0  GLUCOSEU NEGATIVE  HGBUR NEGATIVE  BILIRUBINUR NEGATIVE  KETONESUR NEGATIVE  PROTEINUR NEGATIVE  NITRITE NEGATIVE  LEUKOCYTESUR SMALL*   Micro Results: Recent Results (from the past 240 hour(s))  MRSA PCR Screening     Status: None   Collection Time: 07/30/15  6:24 PM  Result Value Ref Range Status   MRSA by PCR NEGATIVE NEGATIVE Final    Comment:        The GeneXpert MRSA Assay (FDA approved for NASAL specimens only), is one component of  a comprehensive MRSA colonization surveillance program. It is not intended to diagnose MRSA infection nor to guide or monitor treatment for MRSA infections.    Studies/Results: Ct Head Wo Contrast  07/30/2015  CLINICAL DATA:  Altered mental status.  LEFT eye twitching. EXAM: CT HEAD WITHOUT CONTRAST TECHNIQUE: Contiguous axial images were obtained from the base of the skull through the vertex without intravenous contrast. COMPARISON:  Brain MRI 06/17/2015, head CT 09/21/2011 FINDINGS: There is a an ovoid high-density extra-axial fluid collection along the LEFT parietal lobe measuring 3.2 cm by 1.3 cm (image 21, series 2). There is a larger low-density extra-axial fluid collection extending along the entirety of the LEFT cerebral hemisphere. Small amount of high-density fluid along the LEFT cerebellar tentorium additionally. There is mild rightward midline shift of 9 mm. There is mild compression of the LEFT lateral ventricle. There is generalized cortical atrophy. There is extensive periventricular white matter hypodensities. Paranasal sinuses and  mastoid air cells are clear. IMPRESSION: 1. Acute LEFT extra-axial hemorrhage (likely subdural) superimposed on a subacute LEFT subdural hematoma. Both findings are new from MRI of 06/18/2015. 2. Rightward midline shift of 9 mm. Mild compression of the LEFT lateral ventricle. 3. Small amount of subdural blood along the LEFT tentorium. 4. Extensive atrophy and white matter microvascular disease. Critical Value/emergent results were called by telephone at the time of interpretation on 07/30/2015 at 11:29 am to Dr. Linwood Dibbles , who verbally acknowledged these results. Electronically Signed   By: Genevive Bi M.D.   On: 07/30/2015 11:30   Medications: I have reviewed the patient's current medications. Scheduled Meds: . atorvastatin  40 mg Oral Daily  . insulin aspart  0-5 Units Subcutaneous QHS  . insulin aspart  0-9 Units Subcutaneous TID WC  .  levETIRAcetam  500 mg Oral BID  . pantoprazole  40 mg Oral Daily  . risperiDONE  0.5 mg Oral QHS  . sodium chloride  3 mL Intravenous Q12H   Continuous Infusions:  PRN Meds:. Assessment/Plan: Acute subdural Hematoma, with existing old subdural hematoma: Vitals stable currently, no hypertension or bradycardia. No headaches or vision changes per patient. He is limited in compliance with daily examination, unclear if delirious or has some receptive aphasia. Transferred to step down unit base on his stable vital signs and good condition on exam. Passed SLP evaluation and is back on diet. Right now overall plan is probably close observation, holding antiplatelet agents, until either needs surgery for acute compromise or can transition to SNF for ongoing PT/OT needs. -Keppra 500 mg BID for possible seizure -Hold ASA + Plavix -Frequent neuro checks -PT, OT evaluation  Recent CVA: Small left frontal infarct in 06/2015. Started on ASA + Plavix at that time. Still has residual left  sided deficits in sensation and weakness. -Will discuss case with Neurology in setting of stroke Hx with significant ICH for plan after this acute observation  Dementia: Not very interactive today but seems to be largely his baseline dementia. Will continue to examine daily and see if there is change in orientation or interaction as SDH resolves or expands. -Limit adding additional psychoactive/sedating medication -Risperdal 0.5mg  PO qhs  Diet: Carb modified DVT ppx: SCDs DNR/DNI  Dispo: Disposition is deferred at this time, awaiting improvement of current medical problems. Anticipated discharge in approximately 3-4 day(s).   The patient does have a current PCP (No primary care provider on file.) and does not need an [facility] hospital follow-up appointment after discharge.  The patient does not have transportation limitations that hinder transportation to clinic appointments.   LOS: 1 day   Fuller Plan,  MD 07/31/2015, 6:09 PM

## 2015-07-31 NOTE — Progress Notes (Signed)
Patient ID: Parker ChafeRichard Hunter, male   DOB: [birth year], 81 y.o.   MRN: [id number] Patient is awake and alert still confused still slow to follow commands but does seem to follow commands symmetrically with equal strength.  Continue to hold aspirin and Plavix. It's okay from my perspective for patient be transferred to the stepdown unit. Recommend continue observation over the weekend if possible it certainly would be nice to avoid all surgical intervention however based on how patient does over the weekend we'll determine whether he would benefit from a burr hole evacuation of his chronic subdural early next week.

## 2015-07-31 NOTE — Evaluation (Signed)
Physical Therapy Evaluation Patient Details Name: Parker Hunter MRN: [id number] DOB: Mar 17, 1927 Today's Date: 07/31/2015   History of Present Illness  Patient is a 81 y/o male presents with AMS.  PMH includes HTN, CAD, CKD, dementia, /o left frontal CVA (06/2015). The pt was d/c after his stroke in 06/2015 to SNF. Head CT-CT performed in the ED shows large left hemisphere hypodensity suggestive of an older/subacute subdural hematoma and left parietal hyperdensity suggestive of a new subdural hematoma.   Clinical Impression  Patient presents with functional limitations due to deficits listed in PT problem list (see below). Requires assist sitting EOB and Max A of 2 to stand at this time. Per caregiver, pt usually more alert and vibrant. [facility] and working with HHPT and walking short distances with assist, RW and gait belt. Would benefit from [facility] to maximize independence, ease burden of care and minimize fall risk prior to return home.    Follow Up Recommendations SNF    Equipment Recommendations  None recommended by PT    Recommendations for Other Services       Precautions / Restrictions Precautions Precautions: Fall Restrictions Weight Bearing Restrictions: No      Mobility  Bed Mobility Overal bed mobility: Needs Assistance Bed Mobility: Supine to Sit;Sit to Supine     Supine to sit: Max assist;HOB elevated Sit to supine: Total assist;+2 for physical assistance   General bed mobility comments: Pt not able to initiate movement to get to EOB today. Max A to elevate trunk and mobilize LEs to EOB.   Transfers Overall transfer level: Needs assistance Equipment used: 2 person hand held assist Transfers: Sit to/from Stand Sit to Stand: Max assist;+2 physical assistance         General transfer comment: Max A of 2 to stand from EOB. Retro pulsion. posterior lean noted.   Ambulation/Gait Ambulation/Gait assistance:   (Deferred today. )              Stairs            Wheelchair Mobility    Modified Rankin (Stroke Patients Only) Modified Rankin (Stroke Patients Only) Pre-Morbid Rankin Score: Moderately severe disability Modified Rankin: Severe disability     Balance Overall balance assessment: Needs assistance Sitting-balance support: Feet supported;Bilateral upper extremity supported Sitting balance-Leahy Scale: Zero Sitting balance - Comments: Requires Mod A-Min A to maintain sitting balance; posterior trunk lean. Postural control: Posterior lean Standing balance support: During functional activity Standing balance-Leahy Scale: Zero Standing balance comment: Max A of 2 to stand for 20 seconds with posterior lean.                             Pertinent Vitals/Pain Pain Assessment: Faces Faces Pain Scale: No hurt    Home Living Family/patient expects to be discharged to:: Skilled nursing facility                      Prior Function Level of Independence: Needs assistance   Gait / Transfers Assistance Needed: Assist for transfers and for ambulation with RW and gait belt. Getting HHPT  ADL's / Homemaking Assistance Needed: Total A for ADLs. Caregiver present from 9-7 mon-friday and sat-sun  Comments: Pt not able to provide PLOF/history however caregiver and wife at bedside.      Hand Dominance   Dominant Hand: Right    Extremity/Trunk Assessment  Upper Extremity Assessment: Defer to OT evaluation (strong grip bilaterally)           Lower Extremity Assessment: Generalized weakness         Communication      Cognition Arousal/Alertness: Awake/alert Behavior During Therapy: Flat affect Overall Cognitive Status: History of cognitive impairments - at baseline                      General Comments General comments (skin integrity, edema, etc.): Caregiver and spouse present during session.    Exercises        Assessment/Plan     PT Assessment Patient needs continued PT services  PT Diagnosis Generalized weakness;Altered mental status   PT Problem List Decreased strength;Decreased cognition;Decreased balance;Decreased mobility;Decreased activity tolerance  PT Treatment Interventions Balance training;Gait training;Functional mobility training;Therapeutic activities;Therapeutic exercise;Wheelchair mobility training;Patient/family education;Neuromuscular re-education   PT Goals (Current goals can be found in the Care Plan section) Acute Rehab PT Goals Patient Stated Goal: none stated, pt unable PT Goal Formulation: Patient unable to participate in goal setting Time For Goal Achievement: 08/14/15 Potential to Achieve Goals: Fair    Frequency Min 2X/week   Barriers to discharge Decreased caregiver support Caregiver present from 9-7 pm. Wife not able to physically assist    Co-evaluation               End of Session Equipment Utilized During Treatment: Gait belt Activity Tolerance: Patient tolerated treatment well Patient left: in bed;with call bell/phone within reach;with bed alarm set;with family/visitor present;with nursing/sitter in room;with SCD's reapplied Nurse Communication: Mobility status;Need for lift equipment         Time: [id number] PT Time Calculation (min) (ACUTE ONLY): 17 min   Charges:   PT Evaluation $Initial PT Evaluation Tier I: 1 Procedure     PT G Codes:        Minervia Osso A Tanith Dagostino 07/31/2015, 3:44 PM Mylo RedShauna Kennadi Albany, PT, DPT [phone]

## 2015-07-31 NOTE — Progress Notes (Signed)
Internal Medicine Attending:   I saw and examined the patient. I reviewed the resident's note and I agree with the resident's findings and plan as documented in the resident's note.  Symptomatically stable today with normal vital signs and stable neuro exam. He is not very interactive today and very confused due to advanced dementia. Plan is to continue to monitor with neurosurgery and hopefully avoid surgical intervention. Holding antiplatelets now. Will start PT and OT, likely will need to return to SNF. He remains a high risk for delirium and seems to respond well to low dose risperidone for sundowning.

## 2015-07-31 NOTE — Progress Notes (Signed)
PT Cancellation Note  Patient Details Name: Parker Hunter MRN: [id number] DOB: 06-Jul-1927   Cancelled Treatment:    Reason Eval/Treat Not Completed: Patient not medically ready Pt on strict bedrest. Will follow up with PT evaluation once activity level is increased and pt appropriate for therapy.   Parker Hunter 07/31/2015, 8:50 AM Parker Hunter, PT, DPT [phone]

## 2015-07-31 NOTE — Progress Notes (Signed)
[facility]  Patient Status: Active (receiving services up to time of hospitalization)  [facility] is providing the following services: PT, OT and MSW  If patient discharges after hours, please call [phone].   Avie EchevariaKaren Nussbaum 07/31/2015, 10:04 AM

## 2015-07-31 NOTE — Progress Notes (Signed)
OT Cancellation Note  Patient Details Name: Parker ChafeRichard Hunter MRN: [id number] DOB: April 09, 1927   Cancelled Treatment:    Reason Eval/Treat Not Completed: Patient not medically ready - Pt with strict bedrest orders.  Will initiate OT eval, once pt medically cleared for OOB activity.   [name], [name]  [name], OTR/L [phone]  07/31/2015, 11:03 AM

## 2015-08-01 LAB — CBC
HEMATOCRIT: 32.7 % — AB (ref 39.0–52.0)
Hemoglobin: 11.3 g/dL — ABNORMAL LOW (ref 13.0–17.0)
MCH: 29.4 pg (ref 26.0–34.0)
MCHC: 34.6 g/dL (ref 30.0–36.0)
MCV: 85.2 fL (ref 78.0–100.0)
Platelets: 144 10*3/uL — ABNORMAL LOW (ref 150–400)
RBC: 3.84 MIL/uL — ABNORMAL LOW (ref 4.22–5.81)
RDW: 13.1 % (ref 11.5–15.5)
WBC: 6.6 10*3/uL (ref 4.0–10.5)

## 2015-08-01 LAB — GLUCOSE, CAPILLARY
GLUCOSE-CAPILLARY: 131 mg/dL — AB (ref 65–99)
Glucose-Capillary: 166 mg/dL — ABNORMAL HIGH (ref 65–99)
Glucose-Capillary: 112 mg/dL — ABNORMAL HIGH (ref 65–99)
Glucose-Capillary: 126 mg/dL — ABNORMAL HIGH (ref 65–99)

## 2015-08-01 LAB — BASIC METABOLIC PANEL
ANION GAP: 8 (ref 5–15)
BUN: 17 mg/dL (ref 6–20)
CALCIUM: 8.7 mg/dL — AB (ref 8.9–10.3)
CO2: 26 mmol/L (ref 22–32)
Chloride: 103 mmol/L (ref 101–111)
Creatinine, Ser: 1.24 mg/dL (ref 0.61–1.24)
GFR calc Af Amer: 59 mL/min — ABNORMAL LOW (ref >60)
GFR calc non Af Amer: 51 mL/min — ABNORMAL LOW (ref >60)
GLUCOSE: 149 mg/dL — AB (ref 65–99)
Potassium: 3.7 mmol/L (ref 3.5–5.1)
Sodium: 137 mmol/L (ref 135–145)

## 2015-08-01 NOTE — Clinical Social Work Note (Signed)
Clinical Social Work Assessment  Patient Details  Name: Parker Hunter MRN: [id number] Date of Birth: 1927/06/19  Date of referral:  08/01/15               Reason for consult:  Facility Placement, Discharge Planning                Permission sought to share information with:  Family Supports, Oceanographeracility Contact Representative Permission granted to share information::  Yes, Verbal Permission Granted  Name::     Cory RoughenLois Medero  [facility]  Relationship::  Wife  Contact Information:     Housing/Transportation Living arrangements for the past 2 months:  Single Family Home Source of Information:  Spouse Patient Interpreter Needed:  None Criminal Activity/Legal Involvement Pertinent to Current Situation/Hospitalization:  No - Comment as needed Significant Relationships:  Spouse Lives with:  Spouse Do you feel safe going back to the place where you live?  No (High fall risk.) Need for family participation in patient care:  Yes (Comment) (Patient's wife active in patient's care.)  Care giving concerns:  Patient's wife expressed no concerns at this time.   Social Worker assessment / plan:  CSW received referral for possible SNF placement at time of discharge. CSW contacted by patient's wife regarding SNF placement for patient. Per patient's wife, patient's family would prefer for patient to be discharged to [facility] at time of discharge. CSW to continue to follow and assist with discharge planning needs.  Employment status:  Retired Health and safety inspectornsurance information:  Medicare PT Recommendations:  Skilled Nursing Facility Information / Referral to community resources:  Skilled Nursing Facility  Patient/Family's Response to care:  Patient's wife understanding and agreeable to CSW plan of care.  Patient/Family's Understanding of and Emotional Response to Diagnosis, Current Treatment, and Prognosis:  Patient's wife understanding and agreeable to CSW plan of  care.  Emotional Assessment Appearance:  Appears stated age Attitude/Demeanor/Rapport:  Other (CSW received call from patien't wife.) Affect (typically observed):  Other (CSW received call from patien't wife.) Orientation:  Oriented to Self Alcohol / Substance use:  Not Applicable Psych involvement (Current and /or in the community):  No (Comment) (Not appropriate on this admission.)  Discharge Needs  Concerns to be addressed:  No discharge needs identified Readmission within the last 30 days:  No Current discharge risk:  None Barriers to Discharge:  No Barriers Identified   Rod MaeVaughn, Masai Kidd S, LCSW 08/01/2015, 11:36 AM [phone]

## 2015-08-01 NOTE — Progress Notes (Signed)
Subjective: Overnight he slept quite poorly with frequent outbursts, removing condom catheter and objects from the bed. This is consistent with his known significant sundowning but was refractory to his PRN Risperdal 0.5mg . This morning is now very somnolent and will not reply with comprehensible speech but reacts with appropriate arm movements to painful and loud stimuli. Vital signs are stable throughout the day and night.  Objective: Vital signs in last 24 hours: Filed Vitals:   08/01/15 0200 08/01/15 0400 08/01/15 0600 08/01/15 0800  BP: 150/70 156/67 154/70 125/67  Pulse:   83 79  Temp:  98.1 F (36.7 C)  98.8 F (37.1 C)  TempSrc:  Oral  Oral  Resp:   15 9  Weight:      SpO2:   95% 91%   Weight change:   Intake/Output Summary (Last 24 hours) at 08/01/15 1124 Last data filed at 08/01/15 1001  Gross per 24 hour  Intake    173 ml  Output    200 ml  Net    -27 ml   GENERAL- somnolent, only lightly arousable, NAD HEENT- Atraumatic, oral mucosa appears moist CARDIAC- RRR, no murmurs, rubs or gallops. RESP- CTAB, no wheezes or crackles. ABDOMEN- Soft, nontender, no guarding or rebound, normoactive bowel sounds present NEURO- Limited ability to assess given patient snoring when not directly being stimulated and not following specific commands EXTREMITIES- symmetric, no contusions or abrasions PSYCH- Flat affect, speech is intelligible but only single words or phrases, and is confused  Lab Results: Basic Metabolic Panel:  Recent Labs Lab 07/31/15 0423 08/01/15 0719  NA 140 137  K 3.9 3.7  CL 104 103  CO2 30 26  GLUCOSE 92 149*  BUN 13 17  CREATININE 1.19 1.24  CALCIUM 8.8* 8.7*   Liver Function Tests:  Recent Labs Lab 07/30/15 1021  AST 18  ALT 14*  ALKPHOS 97  BILITOT 0.5  PROT 5.8*  ALBUMIN 2.7*   No results for input(s): LIPASE, AMYLASE in the last 168 hours. No results for input(s): AMMONIA in the last 168 hours. CBC:  Recent Labs Lab  07/30/15 1030 07/31/15 0423 08/01/15 0719  WBC  --  5.4 6.6  NEUTROABS 3.4  --   --   HGB  --  10.8* 11.3*  HCT  --  32.3* 32.7*  MCV  --  86.8 85.2  PLT  --  150 144*   Cardiac Enzymes: No results for input(s): CKTOTAL, CKMB, CKMBINDEX, TROPONINI in the last 168 hours. BNP: No results for input(s): PROBNP in the last 168 hours. D-Dimer: No results for input(s): DDIMER in the last 168 hours. CBG:  Recent Labs Lab 07/30/15 1927 07/30/15 2236 07/31/15 0817 07/31/15 1150 07/31/15 1805 07/31/15 2021  GLUCAP 80 80 95 109* 127* 179*   Hemoglobin A1C: No results for input(s): HGBA1C in the last 168 hours. Fasting Lipid Panel: No results for input(s): CHOL, HDL, LDLCALC, TRIG, CHOLHDL, LDLDIRECT in the last 168 hours. Thyroid Function Tests: No results for input(s): TSH, T4TOTAL, FREET4, T3FREE, THYROIDAB in the last 168 hours. Coagulation:  Recent Labs Lab 07/30/15 1030 07/31/15 0423  LABPROT 14.4 15.0  INR 1.10 1.16   Anemia Panel: No results for input(s): VITAMINB12, FOLATE, FERRITIN, TIBC, IRON, RETICCTPCT in the last 168 hours. Urine Drug Screen: Drugs of Abuse     Component Value Date/Time   LABOPIA NONE DETECTED 07/30/2015 1553   COCAINSCRNUR NONE DETECTED 07/30/2015 1553   LABBENZ NONE DETECTED 07/30/2015 1553   AMPHETMU NONE DETECTED  07/30/2015 1553   THCU NONE DETECTED 07/30/2015 1553   LABBARB NONE DETECTED 07/30/2015 1553    Alcohol Level:  Recent Labs Lab 07/30/15 1047  ETH <5   Urinalysis:  Recent Labs Lab 07/30/15 1553  COLORURINE YELLOW  LABSPEC 1.017  PHURINE 6.0  GLUCOSEU NEGATIVE  HGBUR NEGATIVE  BILIRUBINUR NEGATIVE  KETONESUR NEGATIVE  PROTEINUR NEGATIVE  NITRITE NEGATIVE  LEUKOCYTESUR SMALL*   Micro Results: Recent Results (from the past 240 hour(s))  MRSA PCR Screening     Status: None   Collection Time: 07/30/15  6:24 PM  Result Value Ref Range Status   MRSA by PCR NEGATIVE NEGATIVE Final    Comment:        The  GeneXpert MRSA Assay (FDA approved for NASAL specimens only), is one component of a comprehensive MRSA colonization surveillance program. It is not intended to diagnose MRSA infection nor to guide or monitor treatment for MRSA infections.    Studies/Results: No results found. Medications: I have reviewed the patient's current medications. Scheduled Meds: . atorvastatin  40 mg Oral Daily  . insulin aspart  0-5 Units Subcutaneous QHS  . insulin aspart  0-9 Units Subcutaneous TID WC  . levETIRAcetam  500 mg Oral BID  . pantoprazole  40 mg Oral Daily  . risperiDONE  0.5 mg Oral QHS  . sodium chloride  3 mL Intravenous Q12H   Continuous Infusions:  PRN Meds:. Assessment/Plan: Acute subdural Hematoma, with existing old subdural hematoma: Vitals stable, was tolerating diet yesterday without incident. Very agitated overnight and less responsive than yesterday on exam this morning. Might be related to fatigue, delirium during hospitalization. Still need to consider progression of SDH if becomes markedly more altered. -Keppra 500 mg BID for possible seizure -Hold ASA + Plavix -SCDs for DVT ppx -Frequent neuro checks -Continue PT/OT  Recent CVA: Discussed antiplatelet therapies with Neurology service briefly. Current plan will be withhold all Rx for minimum 2 weeks and repeat CT head showing no new bleeding progression/bright blood. At that time ASA can be restarted. Recommend against dual antiplatelet in the future unless other strong indicator such as coronary stenting.  Dementia: Not very interactive today but seems to be largely his baseline dementia. -Limit adding additional psychoactive/sedating medication -Risperdal 0.5mg  PO qhs, will add repeat dosing in 0.5mg  increments if agitation not improved  Diet: Carb modified DVT ppx: SCDs DNR/DNI  Dispo: Disposition is deferred at this time, awaiting improvement of current medical problems. Anticipated discharge in approximately 3  day(s).   The patient does have a current PCP (No primary care provider on file.) and does not [facility] after discharge.  The patient does not have transportation limitations that hinder transportation to clinic appointments.   LOS: 2 days   Fuller Planhristopher W Laramie Meissner, MD 08/01/2015, 11:24 AM

## 2015-08-01 NOTE — Evaluation (Signed)
Occupational Therapy Evaluation Patient Details Name: Parker Hunter MRN: [id number] DOB: [birth year] Today's Date: 08/01/2015    History of Present Illness Patient is a 81 y/o male presents with AMS.  PMH includes HTN, CAD, CKD, dementia, /o left frontal CVA (06/2015). The pt was d/c after his stroke in 06/2015 to SNF. Head CT-CT performed in the ED shows large left hemisphere hypodensity suggestive of an older/subacute subdural hematoma and left parietal hyperdensity suggestive of a new subdural hematoma.    Clinical Impression   Pt admitted with above. He demonstrates the below listed deficits and will benefit from continued OT to maximize safety and independence with BADLs.  Pt requires total A for all ADLs.  Did not follow any commands.  Requires mod - max A to sit EOB.   Recommend SNF level rehab at discharge.        Follow Up Recommendations  SNF    Equipment Recommendations  None recommended by OT    Recommendations for Other Services       Precautions / Restrictions Precautions Precautions: Fall Restrictions Weight Bearing Restrictions: No      Mobility Bed Mobility Overal bed mobility: Needs Assistance Bed Mobility: Supine to Sit     Supine to sit: Max assist;HOB elevated Sit to supine: Max assist   General bed mobility comments: Pt able to assist minimally with lifting trunk from bed, and assisted minimally with lowering truck back to bed and lifting LEs onto bed   Transfers Overall transfer level: Needs assistance               General transfer comment: unable to safely assist.     Balance Overall balance assessment: Needs assistance Sitting-balance support: Feet supported Sitting balance-Leahy Scale: Poor Sitting balance - Comments: Pt requires mod - max A to maintain EOB sitting  Postural control: Right lateral lean;Posterior lean                                  ADL Overall ADL's : Needs assistance/impaired Eating/Feeding:  Total assistance   Grooming: Wash/dry hands;Wash/dry face;Total assistance;Sitting   Upper Body Bathing: Total assistance;Sitting;Bed level   Lower Body Bathing: Total assistance;Bed level   Upper Body Dressing : Total assistance;Sitting;Standing   Lower Body Dressing: Total assistance;Sit to/from stand;Bed level   Toilet Transfer: Total assistance Toilet Transfer Details (indicate cue type and reason): unable to safely attempt  Toileting- Clothing Manipulation and Hygiene: Total assistance;Bed level       Functional mobility during ADLs: Total assistance General ADL Comments: Pt unabe to engage in ADL activities.  Does not follow commands     Vision     Perception     Praxis      Pertinent Vitals/Pain Pain Assessment: Faces Pain Score: 0-No pain     Hand Dominance Right   Extremity/Trunk Assessment Upper Extremity Assessment Upper Extremity Assessment: Difficult to assess due to impaired cognition (Spontaneous movement noted.  Strength appears good )   Lower Extremity Assessment Lower Extremity Assessment: Defer to PT evaluation   Cervical / Trunk Assessment Cervical / Trunk Assessment: Kyphotic   Communication Communication Communication: Receptive difficulties;Expressive difficulties;Other (comment) (Pt with minimal vocalization.  )   Cognition Arousal/Alertness: Awake/alert Behavior During Therapy: Flat affect;Restless Overall Cognitive Status: Impaired/Different from baseline Area of Impairment: Attention;Following commands   Current Attention Level: Focused           General Comments: Pt followed no commands.  He said "yes" in response to a question, but was otherwise non verbal.     General Comments       Exercises       Shoulder Instructions      Home Living Family/patient expects to be discharged to:: Skilled nursing facility                                 Additional Comments: Pt lives with wife and had 10 hours of  hired caregiver services PTA.  Pt was in a SNF until 10 days PTA for rehab      Prior Functioning/Environment Level of Independence: Needs assistance  Gait / Transfers Assistance Needed: Assist for transfers and for ambulation with RW and gait belt. Getting HHPT ADL's / Homemaking Assistance Needed: Pt was able to self feed only.  Otherwise, total A for ADLs. Caregiver present from 9-7 mon-friday and sat-sun   Comments: daughter and wife present. Prior to Sept, pt was fully independent     OT Diagnosis: Generalized weakness;Cognitive deficits   OT Problem List: Decreased strength;Decreased activity tolerance;Impaired balance (sitting and/or standing);Decreased cognition;Decreased safety awareness;Decreased knowledge of use of DME or AE   OT Treatment/Interventions: Self-care/ADL training;DME and/or AE instruction;Therapeutic activities;Cognitive remediation/compensation;Patient/family education;Balance training    OT Goals(Current goals can be found in the care plan section) Acute Rehab OT Goals Patient Stated Goal: Did not state  OT Goal Formulation: With family Time For Goal Achievement: 08/15/15 Potential to Achieve Goals: Fair ADL Goals Pt Will Perform Eating: with min assist;sitting Pt Will Transfer to Toilet: squat pivot transfer;bedside commode;with max assist  OT Frequency: Min 2X/week   Barriers to D/C: Decreased caregiver support          Co-evaluation              End of Session Nurse Communication: Mobility status  Activity Tolerance: Patient tolerated treatment well Patient left: in bed;with call bell/phone within reach;with bed alarm set;with family/visitor present   Time: 1222-1254 OT Time Calculation (min): 32 min Charges:  OT General Charges $OT Visit: 1 Procedure OT Evaluation $Initial OT Evaluation Tier I: 1 Procedure OT Treatments $Therapeutic Activity: 8-22 mins G-Codes:    Parker Hunter M 08-05-2015, 3:58 PM

## 2015-08-01 NOTE — Progress Notes (Signed)
Patient ID: Parker Hunter, male   DOB: [birth year], 81 y.o.   MRN: [id number] Patient is doing okay neurologically stable he is awake he is alert however I do think he is dysphasic he intermittently will follow commands  Continue to hold aspirin and Plavix will need to consider burr hole evacuation Monday or Tuesday if we are going to entertain surgical options. Certainly it may improve his dysphasia but is not without risk in this patient with his multiple medical comorbidities. I'll discuss this with his family on Sunday.

## 2015-08-01 NOTE — Progress Notes (Signed)
Internal Medicine Attending:   I saw and examined the patient. I reviewed the resident's note and I agree with the resident's findings and plan as documented in the resident's note.  81 year old with acute subdural hematoma while on antiplatelet therapy. Clinically stable today, arouses to voice and follows simple commands. Not interactive otherwise, likely slipping into hypoactive delirium. Neurosurgery following, antiplatelets on hold. I am hopeful we can start to mobilize the patient to the chair, reduce medical devices, transfer to the floor if able. We will continue to monitor the patient and plan for transfer to SNF when neurosurgery thinks it safe.

## 2015-08-02 LAB — GLUCOSE, CAPILLARY
GLUCOSE-CAPILLARY: 146 mg/dL — AB (ref 65–99)
GLUCOSE-CAPILLARY: 177 mg/dL — AB (ref 65–99)
Glucose-Capillary: 130 mg/dL — ABNORMAL HIGH (ref 65–99)
Glucose-Capillary: 150 mg/dL — ABNORMAL HIGH (ref 65–99)

## 2015-08-02 NOTE — Progress Notes (Signed)
Subjective: Patient reports sleepy, but arouses  Objective: Vital signs in last 24 hours: Temp:  [97.5 F (36.4 C)-99 F (37.2 C)] 98.1 F (36.7 C) (11/19 0630) Pulse Rate:  [69-91] 69 (11/19 0630) Resp:  [9-20] 18 (11/19 0630) BP: (125-162)/(57-85) 152/60 mmHg (11/19 0630) SpO2:  [91 %-98 %] 97 % (11/19 0630) Weight:  [67.6 kg (149 lb 0.5 oz)] 67.6 kg (149 lb 0.5 oz) (11/18 2046)  Intake/Output from previous day: 11/18 0701 - 11/19 0700 In: 323 [P.O.:320; I.V.:3] Out: -  Intake/Output this shift:    Physical Exam: Not verbalizing, per nurse, but does awaken and responds.    Lab Results:  Recent Labs  07/31/15 0423 08/01/15 0719  WBC 5.4 6.6  HGB 10.8* 11.3*  HCT 32.3* 32.7*  PLT 150 144*   BMET  Recent Labs  07/31/15 0423 08/01/15 0719  NA 140 137  K 3.9 3.7  CL 104 103  CO2 30 26  GLUCOSE 92 149*  BUN 13 17  CREATININE 1.19 1.24  CALCIUM 8.8* 8.7*    Studies/Results: No results found.  Assessment/Plan: Continue to observe, Burr hole drainage of SDH per Dr. Wynetta Emeryram  As planned.    LOS: 3 days    Parker Hunter,Parker Carino D, MD 08/02/2015, 7:39 AM

## 2015-08-02 NOTE — Progress Notes (Signed)
Patient arrived to unit room 5M06 at 2000 awake and alert.  No complaints.  Oriented to room and bed controller.  Will monitor.

## 2015-08-02 NOTE — Progress Notes (Signed)
Subjective: No acute events overnight. Patient not following commands, but awakens to voice and tracks with his eyes. He did not verbalize at all.   Objective: Vital signs in last 24 hours: Filed Vitals:   08/02/15 0230 08/02/15 0430 08/02/15 0630 08/02/15 0810  BP: 144/69 154/66 152/60 128/64  Pulse: 89 72 69 70  Temp: 99 F (37.2 C) 98.4 F (36.9 C) 98.1 F (36.7 C) 98.8 F (37.1 C)  TempSrc: Oral Oral Oral   Resp: Weight:      SpO2: 97% 97% 97% 96%   Weight change: -5 lb 15.2 oz (-2.7 kg)  Intake/Output Summary (Last 24 hours) at 08/02/15 0936 Last data filed at 08/01/15 1830  Gross per 24 hour  Intake    223 ml  Output      0 ml  Net    223 ml   General: somnolent, arousable to voice, not following commands HEENT: Atraumatic, tracks with his eyes, mucus membranes moist CV: RRR, no m/g/r Pulm: CTA bilaterally Neuro: not following commands, does not answer questions Ext: symmetric, warm, no edema   Lab Results: Basic Metabolic Panel:  Recent Labs Lab 07/31/15 0423 08/01/15 0719  NA 140 137  K 3.9 3.7  CL 104 103  CO2 30 26  GLUCOSE 92 149*  BUN 13 17  CREATININE 1.19 1.24  CALCIUM 8.8* 8.7*   Liver Function Tests:  Recent Labs Lab 07/30/15 1021  AST 18  ALT 14*  ALKPHOS 97  BILITOT 0.5  PROT 5.8*  ALBUMIN 2.7*   CBC:  Recent Labs Lab 07/30/15 1030 07/31/15 0423 08/01/15 0719  WBC  --  5.4 6.6  NEUTROABS 3.4  --   --   HGB  --  10.8* 11.3*  HCT  --  32.3* 32.7*  MCV  --  86.8 85.2  PLT  --  150 144*   CBG:  Recent Labs Lab 07/31/15 2021 08/01/15 0824 08/01/15 1135 08/01/15 1711 08/01/15 2219 08/02/15 0627  GLUCAP 179* 131* 166* 112* 126* 130*   Coagulation:  Recent Labs Lab 07/30/15 1030 07/31/15 0423  LABPROT 14.4 15.0  INR 1.10 1.16   Urine Drug Screen: Drugs of Abuse     Component Value Date/Time   LABOPIA NONE DETECTED 07/30/2015 1553   COCAINSCRNUR NONE DETECTED 07/30/2015 1553   LABBENZ  NONE DETECTED 07/30/2015 1553   AMPHETMU NONE DETECTED 07/30/2015 1553   THCU NONE DETECTED 07/30/2015 1553   LABBARB NONE DETECTED 07/30/2015 1553    Alcohol Level:  Recent Labs Lab 07/30/15 1047  ETH <5   Urinalysis:  Recent Labs Lab 07/30/15 1553  COLORURINE YELLOW  LABSPEC 1.017  PHURINE 6.0  GLUCOSEU NEGATIVE  HGBUR NEGATIVE  BILIRUBINUR NEGATIVE  KETONESUR NEGATIVE  PROTEINUR NEGATIVE  NITRITE NEGATIVE  LEUKOCYTESUR SMALL*   Micro Results: Recent Results (from the past 240 hour(s))  MRSA PCR Screening     Status: None   Collection Time: 07/30/15  6:24 PM  Result Value Ref Range Status   MRSA by PCR NEGATIVE NEGATIVE Final    Comment:        The GeneXpert MRSA Assay (FDA approved for NASAL specimens only), is one component of a comprehensive MRSA colonization surveillance program. It is not intended to diagnose MRSA infection nor to guide or monitor treatment for MRSA infections.    Medications: I have reviewed the patient's current medications. Scheduled Meds: . atorvastatin  40 mg Oral Daily  . insulin aspart  0-5 Units  Subcutaneous QHS  . insulin aspart  0-9 Units Subcutaneous TID WC  . levETIRAcetam  500 mg Oral BID  . pantoprazole  40 mg Oral Daily  . risperiDONE  0.5 mg Oral QHS  . sodium chloride  3 mL Intravenous Q12H   Continuous Infusions:  PRN Meds:. Assessment/Plan: Acute Subdural Hematoma w/ Existing Old Subdural Hematoma: Vitals stable. Much less responsive than on admission but could be due to his underlying severe dementia exacerbated by change in environment and poor sleep. Neurosurgery is planning for possible burr hole on Monday or Tuesday.  - Continue Keppra 500 mg BID for possible seizure - Hold ASA + Plavix - SCDs for DVT ppx - Frequent neuro checks - Continue PT/OT - Neurosurgery following, appreciate recommendations   Recent CVA: Discussed antiplatelet therapies with Neurology service briefly. Current plan will be to hold  all Rx for minimum of 2 weeks and repeat CT head showing no new bleeding progression/bright blood. At that time ASA can be restarted. Recommend against dual antiplatelet in the future unless other strong indicator such as coronary stenting.  Dementia: Not very interactive today but seems to be largely his baseline dementia. - Limit adding additional psychoactive/sedating medication, especially benzos  - Continue Risperdal 0.5mg  PO qhs, can add repeat dosing in 0.5mg  increments if agitation not improved  Diet: Carb modified DVT ppx: SCDs DNR/DNI Dispo: Disposition is deferred at this time, awaiting improvement of current medical problems. Anticipated discharge in approximately 3 day(s).   The patient does have a current PCP (No primary care provider on file.) and [facility].  The patient does not have transportation limitations that hinder transportation to clinic appointments.   LOS: 3 days    Parker Numberarly Rivet, MD, MPH Internal Medicine Resident, PGY-II Pager: [phone] 08/02/2015, 9:36 AM

## 2015-08-02 NOTE — Progress Notes (Signed)
Ms. Zella BallRobin, pt's daughter was notified of patient's transfer to [phone].

## 2015-08-02 NOTE — Progress Notes (Signed)
Internal Medicine Attending:   I saw and examined the patient. I reviewed the resident's note and I agree with the resident's findings and plan as documented in the resident's note.  81 year old man admitted for acute subdural hematoma while on dual antiplatelet therapy. He appears much less interactive today, he opens his eyes and tracks me, but does not respond or follow any commands. This could be either symptoms from acute SDH, worsening hypoactive delirium, or probably both. Neurosurgery is following closely. We will see if we can mobilize him more today with delirium precautions and care. Talk with family tomorrow about his options, and this level of poor response may push us to pursue burr hole intervention.

## 2015-08-03 ENCOUNTER — Inpatient Hospital Stay (HOSPITAL_COMMUNITY): Payer: Medicare Other

## 2015-08-03 LAB — GLUCOSE, CAPILLARY
GLUCOSE-CAPILLARY: 128 mg/dL — AB (ref 65–99)
GLUCOSE-CAPILLARY: 164 mg/dL — AB (ref 65–99)
Glucose-Capillary: 139 mg/dL — ABNORMAL HIGH (ref 65–99)
Glucose-Capillary: 79 mg/dL (ref 65–99)

## 2015-08-03 LAB — BASIC METABOLIC PANEL
ANION GAP: 7 (ref 5–15)
BUN: 24 mg/dL — ABNORMAL HIGH (ref 6–20)
CO2: 29 mmol/L (ref 22–32)
Calcium: 8.6 mg/dL — ABNORMAL LOW (ref 8.9–10.3)
Chloride: 102 mmol/L (ref 101–111)
Creatinine, Ser: 1.37 mg/dL — ABNORMAL HIGH (ref 0.61–1.24)
GFR calc Af Amer: 52 mL/min — ABNORMAL LOW (ref >60)
GFR calc non Af Amer: 45 mL/min — ABNORMAL LOW (ref >60)
GLUCOSE: 153 mg/dL — AB (ref 65–99)
Potassium: 3.6 mmol/L (ref 3.5–5.1)
Sodium: 138 mmol/L (ref 135–145)

## 2015-08-03 LAB — CBC
HEMATOCRIT: 34 % — AB (ref 39.0–52.0)
HEMOGLOBIN: 11.4 g/dL — AB (ref 13.0–17.0)
MCH: 28.9 pg (ref 26.0–34.0)
MCHC: 33.5 g/dL (ref 30.0–36.0)
MCV: 86.3 fL (ref 78.0–100.0)
Platelets: 162 10*3/uL (ref 150–400)
RBC: 3.94 MIL/uL — ABNORMAL LOW (ref 4.22–5.81)
RDW: 12.9 % (ref 11.5–15.5)
WBC: 6.8 10*3/uL (ref 4.0–10.5)

## 2015-08-03 NOTE — Progress Notes (Signed)
Subjective: Not acute events overnight. Continued to exhibit increasing agitation late in the day, pulling at lines and staff. Per his family he is much worsened from his condition prior to admission. Completely nonverbal this morning, arouses to voice and tracks but not following commands  Objective: Vital signs in last 24 hours: Filed Vitals:   08/02/15 2154 08/03/15 0132 08/03/15 0547 08/03/15 0950  BP: 150/55 130/54 128/56 164/58  Pulse: 76 71 68 68  Temp: 98.4 F (36.9 C) 98.6 F (37 C) 97.6 F (36.4 C) 98.1 F (36.7 C)  TempSrc: Oral Oral Oral Oral  Resp: 18 18 18 18   Height:      Weight:      SpO2: 97% 100% 99% 98%   Weight change: 0 kg (0 lb)  Intake/Output Summary (Last 24 hours) at 08/03/15 1005 Last data filed at 08/02/15 1200  Gross per 24 hour  Intake    200 ml  Output      0 ml  Net    200 ml   GENERAL- somnolent, arousable to voice, does not follow commands HEENT- Atraumatic, oral mucosa appears moist, eyes tracking movement appropriately CARDIAC- RRR, no murmurs, rubs or gallops. RESP- CTAB, no wheezes or crackles. ABDOMEN- Soft NEURO- Limited ability to assess due to patient not follow commands EXTREMITIES- symmetric, no contusions or abrasions  Lab Results: Basic Metabolic Panel:  Recent Labs Lab 08/01/15 0719 08/03/15 0459  NA 137 138  K 3.7 3.6  CL 103 102  CO2 26 29  GLUCOSE 149* 153*  BUN 17 24*  CREATININE 1.24 1.37*  CALCIUM 8.7* 8.6*   Liver Function Tests:  Recent Labs Lab 07/30/15 1021  AST 18  ALT 14*  ALKPHOS 97  BILITOT 0.5  PROT 5.8*  ALBUMIN 2.7*   No results for input(s): LIPASE, AMYLASE in the last 168 hours. No results for input(s): AMMONIA in the last 168 hours. CBC:  Recent Labs Lab 07/30/15 1030  08/01/15 0719 08/03/15 0459  WBC  --   < > 6.6 6.8  NEUTROABS 3.4  --   --   --   HGB  --   < > 11.3* 11.4*  HCT  --   < > 32.7* 34.0*  MCV  --   < > 85.2 86.3  PLT  --   < > 144* 162  < > = values in  this interval not displayed. Cardiac Enzymes: No results for input(s): CKTOTAL, CKMB, CKMBINDEX, TROPONINI in the last 168 hours. BNP: No results for input(s): PROBNP in the last 168 hours. D-Dimer: No results for input(s): DDIMER in the last 168 hours. CBG:  Recent Labs Lab 08/01/15 2219 08/02/15 0627 08/02/15 1135 08/02/15 1639 08/02/15 2120 08/03/15 0650  GLUCAP 126* 130* 146* 177* 150* 139*   Hemoglobin A1C: No results for input(s): HGBA1C in the last 168 hours. Fasting Lipid Panel: No results for input(s): CHOL, HDL, LDLCALC, TRIG, CHOLHDL, LDLDIRECT in the last 168 hours. Thyroid Function Tests: No results for input(s): TSH, T4TOTAL, FREET4, T3FREE, THYROIDAB in the last 168 hours. Coagulation:  Recent Labs Lab 07/30/15 1030 07/31/15 0423  LABPROT 14.4 15.0  INR 1.10 1.16   Anemia Panel: No results for input(s): VITAMINB12, FOLATE, FERRITIN, TIBC, IRON, RETICCTPCT in the last 168 hours. Urine Drug Screen: Drugs of Abuse     Component Value Date/Time   LABOPIA NONE DETECTED 07/30/2015 1553   COCAINSCRNUR NONE DETECTED 07/30/2015 1553   LABBENZ NONE DETECTED 07/30/2015 1553   AMPHETMU NONE DETECTED 07/30/2015  1553   THCU NONE DETECTED 07/30/2015 1553   LABBARB NONE DETECTED 07/30/2015 1553    Alcohol Level:  Recent Labs Lab 07/30/15 1047  ETH <5   Urinalysis:  Recent Labs Lab 07/30/15 1553  COLORURINE YELLOW  LABSPEC 1.017  PHURINE 6.0  GLUCOSEU NEGATIVE  HGBUR NEGATIVE  BILIRUBINUR NEGATIVE  KETONESUR NEGATIVE  PROTEINUR NEGATIVE  NITRITE NEGATIVE  LEUKOCYTESUR SMALL*   Micro Results: Recent Results (from the past 240 hour(s))  MRSA PCR Screening     Status: None   Collection Time: 07/30/15  6:24 PM  Result Value Ref Range Status   MRSA by PCR NEGATIVE NEGATIVE Final    Comment:        The GeneXpert MRSA Assay (FDA approved for NASAL specimens only), is one component of a comprehensive MRSA colonization surveillance program. It is  not intended to diagnose MRSA infection nor to guide or monitor treatment for MRSA infections.    Studies/Results: No results found. Medications: I have reviewed the patient's current medications. Scheduled Meds: . atorvastatin  40 mg Oral Daily  . insulin aspart  0-5 Units Subcutaneous QHS  . insulin aspart  0-9 Units Subcutaneous TID WC  . levETIRAcetam  500 mg Oral BID  . pantoprazole  40 mg Oral Daily  . risperiDONE  0.5 mg Oral QHS  . sodium chloride  3 mL Intravenous Q12H   Continuous Infusions:  PRN Meds:. Assessment/Plan: Acute subdural Hematoma, with existing old subdural hematoma: Vitals stable, he is much worse than on admission. Need to discuss his progress with family, will F/U planning by Neurosurgery after they discuss procedure with family, possibly surgery in 1-2 days. -Neurosurgery following, recs appreciated -Keppra 500 mg BID for possible seizure -Hold ASA + Plavix -SCDs for DVT ppx -Frequent neuro checks -Continue PT/OT  Recent CVA: Discussed antiplatelet therapies with Neurology service briefly. Current plan will be withhold all Rx for minimum 2 weeks and repeat CT head showing no new bleeding progression/bright blood. At that time ASA can be restarted. Recommend against dual antiplatelet in the future unless other strong indicator such as coronary stenting.  Dementia: Not very interactive today, worse than baseline, with high degree of agitation at night. -Limit adding additional psychoactive/sedating medication, especially benzos -Risperdal 0.5mg  PO qhs, will add repeat dosing in 0.5mg  increments if agitation not improved  Diet: Carb modified DVT ppx: SCDs DNR/DNI  Dispo: Disposition is deferred at this time, awaiting improvement of current medical problems.  The patient does have a current PCP (No primary care provider on file.) and does not need an [facility] hospital follow-up appointment after discharge.  The patient does not have transportation  limitations that hinder transportation to clinic appointments.   LOS: 4 days   Fuller Plan, MD 08/03/2015, 10:05 AM

## 2015-08-03 NOTE — NC FL2 (Signed)
[place] MEDICAID FL2 LEVEL OF CARE SCREENING TOOL     IDENTIFICATION  Patient Name: Parker Hunter Birthdate: 10/04/1926 Sex: male Admission Date (Current Location): 07/30/2015  [facility] and [place] Number: Producer, television/film/video and Address:  [facility] [place], [address], [place], [place] [address]      Provider Number: [id number]  Attending Physician Name and Address:  Tyson Alias, MD  Relative Name and Phone Number:       Current Level of Care: Hospital Recommended Level of Care: Nursing Facility Prior Approval Number:    Date Approved/Denied:   PASRR Number: [id number]  Discharge Plan: SNF    Current Diagnoses: Patient Active Problem List   Diagnosis Date Noted  . Subdural hematoma (HCC) 07/30/2015  . Dementia 06/23/2015  . BPH (benign prostatic hypertrophy) 06/23/2015  . CAD (coronary artery disease) s/p stent 06/18/2015  . Diabetes mellitus type 2, controlled (HCC) 06/18/2015  . Hyperlipidemia 06/18/2015  . Diabetes mellitus type 2 with neurological manifestations (HCC) 06/18/2015  . Cerebrovascular accident (CVA) (HCC)   . Intracranial carotid stenosis   . Stroke [facility]) 06/17/2015    Orientation ACTIVITIES/SOCIAL BLADDER RESPIRATION     (Patient continues to be confused with limited responsiveness)  Passive Continent Normal  BEHAVIORAL SYMPTOMS/MOOD NEUROLOGICAL BOWEL NUTRITION STATUS      Continent    PHYSICIAN VISITS COMMUNICATION OF NEEDS Height & Weight Skin  30 days Non-Verbally  (167.6 cm) 149 lbs. Normal          AMBULATORY STATUS RESPIRATION    Assist extensive Normal      Personal Care Assistance Level of Assistance  Total care       Total Care Assistance: Maximum assistance    Functional Limitations Info  Sight, Hearing Sight Info: Adequate Hearing Info: Adequate         SPECIAL CARE FACTORS FREQUENCY  PT (By licensed PT), OT (By licensed OT)     PT Frequency: 5 OT Frequency: 5            Additional Factors Info  Insulin Sliding Scale       Insulin Sliding Scale Info: 3 times daily       Current Medications (08/03/2015): Current Facility-Administered Medications  Medication Dose Route Frequency Provider Last Rate Last Dose  . atorvastatin (LIPITOR) tablet 40 mg  40 mg Oral Daily Courtney Paris, MD   40 mg at 08/03/15 1132  . insulin aspart (novoLOG) injection 0-5 Units  0-5 Units Subcutaneous QHS Courtney Paris, MD   0 Units at 07/30/15 2200  . insulin aspart (novoLOG) injection 0-9 Units  0-9 Units Subcutaneous TID WC Courtney Paris, MD   2 Units at 08/03/15 1712  . levETIRAcetam (KEPPRA) tablet 500 mg  500 mg Oral BID Courtney Paris, MD   500 mg at 08/03/15 1132  . pantoprazole (PROTONIX) EC tablet 40 mg  40 mg Oral Daily Courtney Paris, MD   40 mg at 08/03/15 1132  . risperiDONE (RISPERDAL) tablet 0.5 mg  0.5 mg Oral QHS Courtney Paris, MD   0.5 mg at 08/02/15 2207  . sodium chloride 0.9 % injection 3 mL  3 mL Intravenous Q12H Courtney Paris, MD   3 mL at 08/02/15 2208   Do not use this list as official medication orders. Please verify with discharge summary.  Discharge Medications:   Medication List    ASK your doctor about these medications  aspirin 81 MG tablet  Take 1 tablet (81 mg total) by mouth daily. Aspirin/Plavix for 3 months, after that Plavix alone     atorvastatin 40 MG tablet  Commonly known as:  LIPITOR  Take 40 mg by mouth daily.     benztropine 1 MG tablet  Commonly known as:  COGENTIN  Take 1 tablet (1 mg total) by mouth daily.     clopidogrel 75 MG tablet  Commonly known as:  PLAVIX  Take 1 tablet (75 mg total) by mouth daily. Aspirin/Plavix for 3 months, after that Plavix alone     glipiZIDE 10 MG 24 hr tablet  Commonly known as:  GLUCOTROL XL  Take 10 mg by mouth daily.     LORazepam 0.5 MG tablet  Commonly known as:  ATIVAN  Take 1 tablet (0.5 mg total) by mouth at bedtime as needed for anxiety.     Melatonin 10 MG Tabs   Take 10 mg by mouth every evening.     omeprazole 20 MG capsule  Commonly known as:  PRILOSEC  Take 20 mg by mouth daily.     risperiDONE 0.5 MG tablet  Commonly known as:  RISPERDAL  Take 1 tablet (0.5 mg total) by mouth at bedtime.     tamsulosin 0.4 MG Caps capsule  Commonly known as:  FLOMAX  Take 1 capsule (0.4 mg total) by mouth daily.        Relevant Imaging Results:  Relevant Lab Results:  Recent Labs    Additional Information    Omah Dewalt, Harrel LemonYWAN J, LCSW

## 2015-08-03 NOTE — Care Management Important Message (Signed)
Important Message  Patient Details  Name: Parker Hunter MRN: [id number] Date of Birth: [birth year]   Medicare Important Message Given:  Yes    Parker Hunter 08/03/2015, 9:57 AM

## 2015-08-03 NOTE — Progress Notes (Signed)
Internal Medicine Attending:   I saw and examined the patient. I reviewed the resident's note and I agree with the resident's findings and plan as documented in the resident's note.  Persistent and worsening hypoactive delirium this morning. He aroused to touch, but not responsive and does not follow commands. Nursing tells us he becomes more active and appropriate in the afternoon, then becomes agitated in the evening requiring risperidone for safety. His worsening delirium is likely multifactorial from hospitalization, SDH, and chronic dementia. Neurosurgery is following closely and will talk to the family about risks and benefits of burr hole evacuation.

## 2015-08-03 NOTE — Progress Notes (Signed)
Patient ID: Parker ChafeRichard Witthuhn, male   DOB: 1927-01-07, 81 y.o.   MRN: [id number] Spoke with family . He is to get a ct head today and then dr Wynetta Emeryram will be talking to them this week about options

## 2015-08-04 LAB — CBC
HEMATOCRIT: 32.4 % — AB (ref 39.0–52.0)
Hemoglobin: 10.9 g/dL — ABNORMAL LOW (ref 13.0–17.0)
MCH: 28.8 pg (ref 26.0–34.0)
MCHC: 33.6 g/dL (ref 30.0–36.0)
MCV: 85.5 fL (ref 78.0–100.0)
PLATELETS: 167 10*3/uL (ref 150–400)
RBC: 3.79 MIL/uL — AB (ref 4.22–5.81)
RDW: 12.9 % (ref 11.5–15.5)
WBC: 6.4 10*3/uL (ref 4.0–10.5)

## 2015-08-04 LAB — GLUCOSE, CAPILLARY
GLUCOSE-CAPILLARY: 103 mg/dL — AB (ref 65–99)
Glucose-Capillary: 122 mg/dL — ABNORMAL HIGH (ref 65–99)
Glucose-Capillary: 143 mg/dL — ABNORMAL HIGH (ref 65–99)
Glucose-Capillary: 151 mg/dL — ABNORMAL HIGH (ref 65–99)

## 2015-08-04 LAB — BASIC METABOLIC PANEL
ANION GAP: 7 (ref 5–15)
BUN: 22 mg/dL — ABNORMAL HIGH (ref 6–20)
CALCIUM: 8.5 mg/dL — AB (ref 8.9–10.3)
CO2: 29 mmol/L (ref 22–32)
Chloride: 101 mmol/L (ref 101–111)
Creatinine, Ser: 1.35 mg/dL — ABNORMAL HIGH (ref 0.61–1.24)
GFR, EST AFRICAN AMERICAN: 53 mL/min — AB (ref >60)
GFR, EST NON AFRICAN AMERICAN: 46 mL/min — AB (ref >60)
GLUCOSE: 149 mg/dL — AB (ref 65–99)
POTASSIUM: 3.8 mmol/L (ref 3.5–5.1)
Sodium: 137 mmol/L (ref 135–145)

## 2015-08-04 MED ORDER — DEXAMETHASONE 2 MG PO TABS: 2.0000 mg | ORAL_TABLET | Freq: Four times a day (QID) | ORAL | Status: DC

## 2015-08-04 MED ORDER — DEXAMETHASONE SODIUM PHOSPHATE 4 MG/ML IJ SOLN
2.0000 mg | Freq: Four times a day (QID) | INTRAMUSCULAR | Status: AC
  Administered 2015-08-04 – 2015-08-05 (×4): 2 mg via INTRAVENOUS
  Filled 2015-08-04 (×4): qty 1

## 2015-08-04 NOTE — Clinical Documentation Improvement (Signed)
Internal Medicine Neuro Surgery  Based on the clinical findings below, please document any associated diagnoses/conditions the patient has or may have.   Dementia with behavioral disturbances  Other  Clinically Undetermined  Supporting Information: --  11/20 Note "Continued to exhibit increasing agitation late in the day, pulling at lines and staff" --  11/20  "Risperdal 0.5mg  PO qhs, will add repeat dosing"   Please exercise your independent, professional judgment when responding. A specific answer is not anticipated or expected.  Thank You, Beverley FiedlerLaurie E Ralphael Southgate RN [facility] [employer] [facility] [place] [phone]

## 2015-08-04 NOTE — Clinical Social Work Note (Signed)
Clinical Social Worker has met with patient and family to present bed offers. Family does NOT wish for patient to return to [facility].   Patient was discharged from [facility] [place] on 11/07 and is currently in his co-pay days. Family is aware that patient is in his co-pays days.   Family currently considering [facility] and [place]. Family has met with RN Liaison of [facility].   CSW remains available as needed.   Glendon Axe, MSW, LCSWA [phone] 08/04/2015 3:42 PM

## 2015-08-04 NOTE — Progress Notes (Signed)
Patient ID: Parker ChafeRichard Hunter, male   DOB: Aug 09, 1927, 81 y.o.   MRN: [id number] I spent an extensive time discussing Parker Hunter case with his wife and his daughter as well as another daughter lives in [place]. The family is not interested in aggressive surgical intervention which I completely understand. I explained to them that he has a combination of a chronic subdural with a mild to moderate sized acute component and I think the acute component may be more symptomatically for him. He is mental status has waxed and waned yesterday was actually one of his better days but as they have made him nothing by mouth this morning, his confusion has developed which apparently is his baseline whenever he is not allowed to be fed. It is possible we may entertain burr hole evacuation at some point in the future however the acute component would have to liquefy for at least the next week or 2 before that would be an option. At this point we have all agreed to proceed for with conservative treatment continue with rehabilitation look towards placement and then we'll arrange a CT in one to 2 weeks in follow-up. I have instructed the nurse to find him a lunch tray 2 and continue to mobilize and work with therapy while in the hospital and I think we can also work on placement. Everybody seems to be in agreement and on the same page with this. I do think it may be worthwhile as long as he is medically able to go on a short course of steroids and recommend Decadron 2 mg by mouth or IV every 6 for 4 doses. Then transition to a Medrol Dosepak orally. I will await medicines evaluation for this is initiated to make sure he doesn't have a contraindication.

## 2015-08-04 NOTE — Progress Notes (Signed)
Internal Medicine Attending:   I saw and examined the patient. I reviewed the resident's note and I agree with the resident's findings and plan as documented in the resident's note.  Clinically Parker Hunter is stable with persistent severe hypoactive delirium which is likely multifactorial due to dementia with behavioral disturbances exacerbated by acute subdural hematoma. Repeat CT head shows small enlarging of hypodensity but not change in midline shift. He continues to be unresponsive in the morning, more responsive in the afternoon, and agitated in the evening. Neurosurgery is managing SDH and will talk with family today about the risks and benefits of surgical evacuation.

## 2015-08-04 NOTE — Progress Notes (Signed)
Subjective: Not acute events overnight. Minimal interaction this morning, awakens and attends to voice, tracks with eyes, but is nonverbal and does not follow commands. Repeat CT yesterday showed enlargement of SDH. Plan is to discuss with family and neurosurgery service options at this point.  Objective: Vital signs in last 24 hours: Filed Vitals:   08/03/15 1806 08/03/15 2205 08/04/15 0224 08/04/15 0529  BP: 123/40 133/41 142/59 136/62  Pulse: 71 74 88 78  Temp: 98.4 F (36.9 C) 98.3 F (36.8 C) 98 F (36.7 C) 98.3 F (36.8 C)  TempSrc: Oral Oral Oral Oral  Resp: Height:      Weight:      SpO2: 95% 96% 97% 96%   Weight change:   Intake/Output Summary (Last 24 hours) at 08/04/15 0944 Last data filed at 08/03/15 2200  Gross per 24 hour  Intake    120 ml  Output      0 ml  Net    120 ml   GENERAL- arousable to voice, does not follow commands HEENT- Atraumatic, oral mucosa appears moist, eyes tracking movement appropriately CARDIAC- RRR, no murmurs, rubs or gallops RESP- CTAB, no wheezes or crackles ABDOMEN- Soft, no guarding NEURO- Limited ability to assess due to patient not follow commands EXTREMITIES- symmetric, no contusions or abrasions  Lab Results: Basic Metabolic Panel:  Recent Labs Lab 08/03/15 0459 08/04/15 0525  NA 138 137  K 3.6 3.8  CL 102 101  CO2 29 29  GLUCOSE 153* 149*  BUN 24* 22*  CREATININE 1.37* 1.35*  CALCIUM 8.6* 8.5*   Liver Function Tests:  Recent Labs Lab 07/30/15 1021  AST 18  ALT 14*  ALKPHOS 97  BILITOT 0.5  PROT 5.8*  ALBUMIN 2.7*   No results for input(s): LIPASE, AMYLASE in the last 168 hours. No results for input(s): AMMONIA in the last 168 hours. CBC:  Recent Labs Lab 07/30/15 1030  08/03/15 0459 08/04/15 0525  WBC  --   < > 6.8 6.4  NEUTROABS 3.4  --   --   --   HGB  --   < > 11.4* 10.9*  HCT  --   < > 34.0* 32.4*  MCV  --   < > 86.3 85.5  PLT  --   < > 162 167  < > = values in this  interval not displayed. Cardiac Enzymes: No results for input(s): CKTOTAL, CKMB, CKMBINDEX, TROPONINI in the last 168 hours. BNP: No results for input(s): PROBNP in the last 168 hours. D-Dimer: No results for input(s): DDIMER in the last 168 hours. CBG:  Recent Labs Lab 08/02/15 2120 08/03/15 0650 08/03/15 1109 08/03/15 1617 08/03/15 2130 08/04/15 0637  GLUCAP 150* 139* 128* 164* 79 122*   Hemoglobin A1C: No results for input(s): HGBA1C in the last 168 hours. Fasting Lipid Panel: No results for input(s): CHOL, HDL, LDLCALC, TRIG, CHOLHDL, LDLDIRECT in the last 168 hours. Thyroid Function Tests: No results for input(s): TSH, T4TOTAL, FREET4, T3FREE, THYROIDAB in the last 168 hours. Coagulation:  Recent Labs Lab 07/30/15 1030 07/31/15 0423  LABPROT 14.4 15.0  INR 1.10 1.16   Anemia Panel: No results for input(s): VITAMINB12, FOLATE, FERRITIN, TIBC, IRON, RETICCTPCT in the last 168 hours. Urine Drug Screen: Drugs of Abuse     Component Value Date/Time   LABOPIA NONE DETECTED 07/30/2015 1553   COCAINSCRNUR NONE DETECTED 07/30/2015 1553   LABBENZ NONE DETECTED 07/30/2015 1553   AMPHETMU NONE DETECTED 07/30/2015 1553  THCU NONE DETECTED 07/30/2015 1553   LABBARB NONE DETECTED 07/30/2015 1553    Alcohol Level:  Recent Labs Lab 07/30/15 1047  ETH <5   Urinalysis:  Recent Labs Lab 07/30/15 1553  COLORURINE YELLOW  LABSPEC 1.017  PHURINE 6.0  GLUCOSEU NEGATIVE  HGBUR NEGATIVE  BILIRUBINUR NEGATIVE  KETONESUR NEGATIVE  PROTEINUR NEGATIVE  NITRITE NEGATIVE  LEUKOCYTESUR SMALL*   Micro Results: Recent Results (from the past 240 hour(s))  MRSA PCR Screening     Status: None   Collection Time: 07/30/15  6:24 PM  Result Value Ref Range Status   MRSA by PCR NEGATIVE NEGATIVE Final    Comment:        The GeneXpert MRSA Assay (FDA approved for NASAL specimens only), is one component of a comprehensive MRSA colonization surveillance program. It is  not intended to diagnose MRSA infection nor to guide or monitor treatment for MRSA infections.    Studies/Results: Ct Head Wo Contrast  08/03/2015  CLINICAL DATA:  Subdural hematoma followup EXAM: CT HEAD WITHOUT CONTRAST TECHNIQUE: Contiguous axial images were obtained from the base of the skull through the vertex without intravenous contrast. COMPARISON:  07/30/2015 FINDINGS: Mixed density subdural hematoma on the left. Low-density area has increased in thickness now measuring 20 mm. Lenticular high-density component to subdural hemorrhage in the left parietal area is unchanged measuring 12 mm in thickness. This extends anteriorly. There is a small left tentorial subdural hematoma which is unchanged. 6.5 mm midline shift to the right is approximately the same. Generalized atrophy. Chronic microvascular ischemic change in the white matter. Chronic infarct in the right frontal lobe and right basal ganglia unchanged. No acute infarct or mass. Negative for skull fracture. IMPRESSION: Mixed density subdural hematoma on the left. The low-density component appears to have enlarged in the interval. The high-density component is stable. There is a small left tentorial subdural hematoma. 6.5 mm midline shift to the right is unchanged. Atrophy and chronic ischemia especially on the right. Electronically Signed   By: Marlan Palauharles  Clark M.D.   On: 08/03/2015 13:27   Medications: I have reviewed the patient's current medications. Scheduled Meds: . atorvastatin  40 mg Oral Daily  . insulin aspart  0-5 Units Subcutaneous QHS  . insulin aspart  0-9 Units Subcutaneous TID WC  . levETIRAcetam  500 mg Oral BID  . pantoprazole  40 mg Oral Daily  . risperiDONE  0.5 mg Oral QHS  . sodium chloride  3 mL Intravenous Q12H   Continuous Infusions:  PRN Meds:. Assessment/Plan: Acute subdural Hematoma, with existing old subdural hematoma: Vitals stable, mentally worse than baseline. We will F/U planning by Neurosurgery  after they discuss procedure with family, possibly surgery in 1-2 days. -Neurosurgery following, recs appreciated -Keppra 500 mg BID -SCDs for DVT ppx -Frequent neuro checks  Recent CVA: Holding antiplatelet therapy. Can consider ASA in minimum 2 weeks out from demonstrated no worsening bleeding.  Dementia, hypoactive delirium: Minimally interactive in mornings, with high degree of agitation at night. -Limit adding additional psychoactive/sedating medication, especially benzos -Risperdal 0.5mg  PO qhs, will add repeat dosing in 0.5mg  increments as needed  Diet: Carb modified DVT ppx: SCDs DNR/DNI  Dispo: Disposition is deferred at this time, awaiting improvement of current medical problems. Will know better estimate after decision for surgery or not is finalized.  The patient does have a current PCP (No primary care provider on file.) and [facility] not [facility] after discharge.  The patient does not have transportation  limitations that hinder transportation to clinic appointments.   LOS: 5 days   Fuller Plan, MD 08/04/2015, 9:44 AM

## 2015-08-04 NOTE — Progress Notes (Signed)
Physical Therapy Treatment Patient Details Name: Parker ChafeRichard Voight MRN: [id number] DOB: 1926/11/26 Today's Date: 08/04/2015    History of Present Illness Patient is a 81 y/o male presents with AMS.  PMH includes HTN, CAD, CKD, dementia, /o left frontal CVA (06/2015). The pt was d/c after his stroke in 06/2015 to SNF. Head CT-CT performed in the ED shows large left hemisphere hypodensity suggestive of an older/subacute subdural hematoma and left parietal hyperdensity suggestive of a new subdural hematoma.     PT Comments    Pt with decreased participation in therapy session today. Was able to achieve EOB with +2 assist, and focus of session was sitting balance and initiating trunk control. Was not following commands. Will continue to follow and progress as able per POC.    Follow Up Recommendations  SNF     Equipment Recommendations  None recommended by PT    Recommendations for Other Services       Precautions / Restrictions Precautions Precautions: Fall Restrictions Weight Bearing Restrictions: No    Mobility  Bed Mobility Overal bed mobility: Needs Assistance Bed Mobility: Supine to Sit;Sit to Supine     Supine to sit: Total assist;+2 for physical assistance Sit to supine: Total assist;+2 for physical assistance   General bed mobility comments: No attempt at assisting with mobility.   Transfers                    Ambulation/Gait                 Stairs            Wheelchair Mobility    Modified Rankin (Stroke Patients Only) Modified Rankin (Stroke Patients Only) Pre-Morbid Rankin Score: Moderately severe disability Modified Rankin: Severe disability     Balance Overall balance assessment: Needs assistance Sitting-balance support: Bilateral upper extremity supported;Feet supported Sitting balance-Leahy Scale: Zero Sitting balance - Comments: Max assist at times to maintain sitting balance. If support was taken away, pt attempted to reach out  and steady himself but was unable to sit unsupported independently.                             Cognition Arousal/Alertness: Lethargic Behavior During Therapy: Flat affect Overall Cognitive Status: Impaired/Different from baseline Area of Impairment: Attention;Following commands;Awareness   Current Attention Level: Focused   Following Commands:  (Unable to follow commands)   Awareness: Intellectual Problem Solving: Slow processing;Decreased initiation;Difficulty sequencing;Requires verbal cues;Requires tactile cues General Comments: Pt followed no commands.  He said "yes" in response to a question, but was otherwise non verbal.      Exercises Other Exercises Other Exercises: Lateral leans onto elbow and return to midline. total assist required for return to midline.     General Comments        Pertinent Vitals/Pain Pain Assessment: Faces Faces Pain Scale: No hurt    Home Living                      Prior Function            PT Goals (current goals can now be found in the care plan section) Acute Rehab PT Goals Patient Stated Goal: Did not state  PT Goal Formulation: Patient unable to participate in goal setting Time For Goal Achievement: 08/14/15 Potential to Achieve Goals: Fair Progress towards PT goals: Not progressing toward goals - comment    Frequency  Min 2X/week  PT Plan Current plan remains appropriate    Co-evaluation             End of Session   Activity Tolerance: Patient limited by lethargy Patient left: in bed;with bed alarm set;with call bell/phone within reach     Time: 0811-0831 PT Time Calculation (min) (ACUTE ONLY): 20 min  Charges:  $Therapeutic Activity: 8-22 mins                    G Codes:      Conni Slipper 08-26-2015, 10:09 AM  Conni Slipper, PT, DPT Acute Rehabilitation Services Pager: [phone]

## 2015-08-05 LAB — GLUCOSE, CAPILLARY
GLUCOSE-CAPILLARY: 177 mg/dL — AB (ref 65–99)
Glucose-Capillary: 165 mg/dL — ABNORMAL HIGH (ref 65–99)

## 2015-08-05 MED ORDER — METHYLPREDNISOLONE 4 MG PO TBPK: ORAL_TABLET | ORAL | Status: DC

## 2015-08-05 NOTE — Clinical Social Work Placement (Signed)
   CLINICAL SOCIAL WORK PLACEMENT  NOTE  Date:  08/05/2015  Patient Details  Name: Parker ChafeRichard Heydt MRN: [id number] Date of Birth: 1927-09-06  Clinical Social Work is seeking post-discharge placement for this patient at the Skilled  Nursing Facility level of care (*CSW will initial, date and re-position this form in  chart as items are completed):  Yes   Patient/family provided with [facility] Clinical Social Work Department's list of facilities offering this level of care within the geographic area requested by the patient (or if unable, by the patient's family).  Yes   Patient/family informed of their freedom to choose among providers that offer the needed level of care, that participate in Medicare, Medicaid or managed care program needed by the patient, have an available bed and are willing to accept the patient.  Yes   Patient/family informed of [facility]'s ownership interest in [facility] and [facility], as well as of the fact that they are under no obligation to receive care at these facilities.  PASRR submitted to EDS on 08/05/15     PASRR number received on 08/05/15     Existing PASRR number confirmed on       FL2 transmitted to all facilities in geographic area requested by pt/family on 08/05/15     FL2 transmitted to all facilities within larger geographic area on       Patient informed that his/her managed care company has contracts with or will negotiate with certain facilities, including the following:        Yes   Patient/family informed of bed offers received.  Patient chooses bed at  [facility] )     Physician recommends and patient chooses bed at      Patient to be transferred to  [facility] ) on 08/05/15.  Patient to be transferred to facility by  [facility] )     Patient family notified on 08/05/15 of transfer.  Name of family member notified:   (Pt's dtr, Zella BallRobin and wife, Mellette LionsLois )     PHYSICIAN Please prepare  priority discharge summary, including medications     Additional Comment:    _______________________________________________ Derenda FennelBashira Aariyah Sampey, MSW, LCSWA [phone] 08/05/2015 2:28 PM

## 2015-08-05 NOTE — Progress Notes (Signed)
Internal Medicine Attending:   I saw and examined the patient. I reviewed the resident's note and I agree with the resident's findings and plan as documented in the resident's note.  Parker Hunter looks clinically improved today. He was awake spontaneously this morning, followed simple commands and answered simple questions. We started dexamethasone yesterday for medical management of SDH. I agree with the decision to continue non-surgical management. Our plan will be to optimize his functional status and quality of life. We will seek SNF placement and he will follow up with neurosurgery in 1-2 weeks for repeat head imaging.

## 2015-08-05 NOTE — Progress Notes (Signed)
Subjective: Not acute events overnight. More interactive this morning, single word verbal answers to questions. Dr. Wynetta Emeryram spoke with family at length yesterday, current plan was decided to not perform surgical intervention at this time and will reassess at a later date. Family now looking at SNF placement options as he will require assistance with all ADLs.  Objective: Vital signs in last 24 hours: Filed Vitals:   08/04/15 2152 08/05/15 0129 08/05/15 0621 08/05/15 0936  BP: 151/72 163/81 135/59 140/64  Pulse: 82 87 81 82  Temp: 98.7 F (37.1 C) 98.7 F (37.1 C) 98.3 F (36.8 C) 98.5 F (36.9 C)  TempSrc: Oral Oral  Oral  Resp: 18 18 18 17   Height:      Weight:      SpO2: 95% 96% 95% 98%   Weight change:   Intake/Output Summary (Last 24 hours) at 08/05/15 1027 Last data filed at 08/04/15 1800  Gross per 24 hour  Intake    480 ml  Output      0 ml  Net    480 ml   GENERAL- arousable to voice, responds to questions with single word answers HEENT- Atraumatic, oral mucosa appears moist, eyes tracking movement appropriately CARDIAC- RRR, no murmurs, rubs or gallops RESP- CTAB, no wheezes or crackles ABDOMEN- Soft, no guarding NEURO- Nonparticipatory with exam EXTREMITIES- symmetric, no contusions or abrasions  Lab Results: Basic Metabolic Panel:  Recent Labs Lab 08/03/15 0459 08/04/15 0525  NA 138 137  K 3.6 3.8  CL 102 101  CO2 29 29  GLUCOSE 153* 149*  BUN 24* 22*  CREATININE 1.37* 1.35*  CALCIUM 8.6* 8.5*   Liver Function Tests:  Recent Labs Lab 07/30/15 1021  AST 18  ALT 14*  ALKPHOS 97  BILITOT 0.5  PROT 5.8*  ALBUMIN 2.7*   CBC:  Recent Labs Lab 07/30/15 1030  08/03/15 0459 08/04/15 0525  WBC  --   < > 6.8 6.4  NEUTROABS 3.4  --   --   --   HGB  --   < > 11.4* 10.9*  HCT  --   < > 34.0* 32.4*  MCV  --   < > 86.3 85.5  PLT  --   < > 162 167  < > = values in this interval not displayed.  CBG:  Recent Labs Lab 08/03/15 2130  08/04/15 0637 08/04/15 1130 08/04/15 1624 08/04/15 2214 08/05/15 0644  GLUCAP 79 122* 103* 143* 151* 165*   Coagulation:  Recent Labs Lab 07/30/15 1030 07/31/15 0423  LABPROT 14.4 15.0  INR 1.10 1.16   Urine Drug Screen: Drugs of Abuse     Component Value Date/Time   LABOPIA NONE DETECTED 07/30/2015 1553   COCAINSCRNUR NONE DETECTED 07/30/2015 1553   LABBENZ NONE DETECTED 07/30/2015 1553   AMPHETMU NONE DETECTED 07/30/2015 1553   THCU NONE DETECTED 07/30/2015 1553   LABBARB NONE DETECTED 07/30/2015 1553    Alcohol Level:  Recent Labs Lab 07/30/15 1047  ETH <5   Urinalysis:  Recent Labs Lab 07/30/15 1553  COLORURINE YELLOW  LABSPEC 1.017  PHURINE 6.0  GLUCOSEU NEGATIVE  HGBUR NEGATIVE  BILIRUBINUR NEGATIVE  KETONESUR NEGATIVE  PROTEINUR NEGATIVE  NITRITE NEGATIVE  LEUKOCYTESUR SMALL*   Micro Results: Recent Results (from the past 240 hour(s))  MRSA PCR Screening     Status: None   Collection Time: 07/30/15  6:24 PM  Result Value Ref Range Status   MRSA by PCR NEGATIVE NEGATIVE Final    Comment:  The GeneXpert MRSA Assay (FDA approved for NASAL specimens only), is one component of a comprehensive MRSA colonization surveillance program. It is not intended to diagnose MRSA infection nor to guide or monitor treatment for MRSA infections.    Studies/Results: Ct Head Wo Contrast  08/03/2015  CLINICAL DATA:  Subdural hematoma followup EXAM: CT HEAD WITHOUT CONTRAST TECHNIQUE: Contiguous axial images were obtained from the base of the skull through the vertex without intravenous contrast. COMPARISON:  07/30/2015 FINDINGS: Mixed density subdural hematoma on the left. Low-density area has increased in thickness now measuring 20 mm. Lenticular high-density component to subdural hemorrhage in the left parietal area is unchanged measuring 12 mm in thickness. This extends anteriorly. There is a small left tentorial subdural hematoma which is unchanged.  6.5 mm midline shift to the right is approximately the same. Generalized atrophy. Chronic microvascular ischemic change in the white matter. Chronic infarct in the right frontal lobe and right basal ganglia unchanged. No acute infarct or mass. Negative for skull fracture. IMPRESSION: Mixed density subdural hematoma on the left. The low-density component appears to have enlarged in the interval. The high-density component is stable. There is a small left tentorial subdural hematoma. 6.5 mm midline shift to the right is unchanged. Atrophy and chronic ischemia especially on the right. Electronically Signed   By: Marlan Palau M.D.   On: 08/03/2015 13:27   Medications: I have reviewed the patient's current medications. Scheduled Meds: . atorvastatin  40 mg Oral Daily  . dexamethasone  2 mg Intravenous 4 times per day  . insulin aspart  0-5 Units Subcutaneous QHS  . insulin aspart  0-9 Units Subcutaneous TID WC  . levETIRAcetam  500 mg Oral BID  . pantoprazole  40 mg Oral Daily  . risperiDONE  0.5 mg Oral QHS  . sodium chloride  3 mL Intravenous Q12H   Continuous Infusions:  PRN Meds:. Assessment/Plan: Acute subdural Hematoma, with existing old subdural hematoma: Vitals stable, he is much more interactive than yesterday on encounter. After family discussion, neurosurgery plan is follow up in a few weeks without immediate intervention at this time. Family considering options for SNF placement. Despite day to day variation in condition his overall prognosis is very poor with or without surgery. We would consider Palliative care input on his comfort and goals of his SNF placement, as based on his PT/OT eval here and our overall impression it is questionable that a rehab course will improve his ability to perform ADLs independently. -Neurosurgery following, recs appreciated -Consulted palliative care, recs appreciated -Keppra 500 mg BID -Decadron  po q6hrs x4 doses -SCDs for DVT ppx -CSW helping  with placement  Recent CVA: Holding antiplatelet therapy. Can consider ASA in minimum 2 weeks out from demonstrated no worsening bleeding.  Dementia, hypoactive delirium: Much more interactive today, speaking 1 word answers, still not oriented. Maybe an improvement with starting his PO steroids. -Limit adding additional psychoactive/sedating medication, especially benzos -Risperdal 0.5mg  PO qhs, will add repeat dosing in 0.5mg  increments as needed  Diet: Carb modified DVT ppx: SCDs DNR/DNI  Dispo: Disposition is deferred at this time, awaiting improvement of current medical problems. Disposition being decided, likely discharge in 1-2 days.  The patient does have a current PCP (No primary care provider on file.) and does not need an [facility] hospital follow-up appointment after discharge.  The patient does not have transportation limitations that hinder transportation to clinic appointments.   LOS: 6 days   Fuller Plan, MD 08/05/2015, 10:27 AM

## 2015-08-05 NOTE — Clinical Social Work Note (Signed)
Clinical Social Worker facilitated patient discharge including contacting patient family and facility to confirm patient discharge plans.  Clinical information faxed to facility and family agreeable with plan.  CSW arranged ambulance transport via [facility].  RN to call report prior to discharge.  Clinical Social Worker will sign off for now as social work intervention is no longer needed. Please consult us again if new need arises.  Derenda FennelBashira Anarosa Kubisiak, MSW, LCSWA [phone] 08/05/2015 3:18 PM

## 2015-08-05 NOTE — Discharge Summary (Signed)
Name: Parker Hunter MRN: [id number] DOB: 1927-06-02 81 y.o. PCP: No primary care provider on file.  Date of Admission: 07/30/2015  9:59 AM Date of Discharge: 08/05/2015 Attending Physician: Tyson Alias, MD  Discharge Diagnosis: Principal Problem:   Subdural hematoma (HCC) Active Problems:   CAD (coronary artery disease) s/p stent   Diabetes mellitus type 2, controlled (HCC)   Dementia   BPH (benign prostatic hypertrophy)  Discharge Medications:   Medication List    STOP taking these medications        aspirin 81 MG tablet     benztropine 1 MG tablet  Commonly known as:  COGENTIN     clopidogrel 75 MG tablet  Commonly known as:  PLAVIX      TAKE these medications        atorvastatin 40 MG tablet  Commonly known as:  LIPITOR  Take 40 mg by mouth daily.     glipiZIDE 10 MG 24 hr tablet  Commonly known as:  GLUCOTROL XL  Take 10 mg by mouth daily.     LORazepam 0.5 MG tablet  Commonly known as:  ATIVAN  Take 1 tablet (0.5 mg total) by mouth at bedtime as needed for anxiety.     Melatonin 10 MG Tabs  Take 10 mg by mouth every evening.     methylPREDNISolone 4 MG Tbpk tablet  Commonly known as:  MEDROL DOSEPAK  Take 3 tabs daily for 4 days Take 2 tabs daily for 3 days Take 1 tab daily for 3 days     omeprazole 20 MG capsule  Commonly known as:  PRILOSEC  Take 20 mg by mouth daily.     risperiDONE 0.5 MG tablet  Commonly known as:  RISPERDAL  Take 1 tablet (0.5 mg total) by mouth at bedtime.     tamsulosin 0.4 MG Caps capsule  Commonly known as:  FLOMAX  Take 1 capsule (0.4 mg total) by mouth daily.        Disposition and follow-up:   Parker Hunter was discharged from [facility] in Fair condition.  At the hospital follow up visit please address:  1.  Acute on chronic subdural hematoma: Left sided subdural hematoma with a known provoking incident causing small stable R parietal bleed, with a repeat larger R acute bleed  without known provoking incident. He was on dual antiplatelet therapy due to recent stroke in 06/2015. Medrol dose pack to be started at 12-8-4mg  of oral methylprednisolone over the next 10 days. All anticoagulation to be held. Possible resumption of ASA  2 weeks after radiographic evidence of bleed stabilization and if no surgery recommended.  2.  Hypoactive delirium on chronic dementia: He has pronounced psychomotor retardation in the mornings and agitation in evenings. Recommend risperdal 0.5mg  qhs plus repeat dosing as needed, with attempting to avoid benzodiazepines as a contributor to his worsened delirium during hospital course.  3.  Repeat head CT and neurosurgery follow up recommended in approximately 2 weeks  Consultations: Treatment Team:  Donalee Citrin, MD [facility]  Procedures Performed:  Ct Head Wo Contrast  08/03/2015  CLINICAL DATA:  Subdural hematoma followup EXAM: CT HEAD WITHOUT CONTRAST TECHNIQUE: Contiguous axial images were obtained from the base of the skull through the vertex without intravenous contrast. COMPARISON:  07/30/2015 FINDINGS: Mixed density subdural hematoma on the left. Low-density area has increased in thickness now measuring 20 mm. Lenticular high-density component to subdural hemorrhage in the left parietal area is unchanged measuring 12 mm  in thickness. This extends anteriorly. There is a small left tentorial subdural hematoma which is unchanged. 6.5 mm midline shift to the right is approximately the same. Generalized atrophy. Chronic microvascular ischemic change in the white matter. Chronic infarct in the right frontal lobe and right basal ganglia unchanged. No acute infarct or mass. Negative for skull fracture. IMPRESSION: Mixed density subdural hematoma on the left. The low-density component appears to have enlarged in the interval. The high-density component is stable. There is a small left tentorial subdural hematoma. 6.5 mm midline shift to the  right is unchanged. Atrophy and chronic ischemia especially on the right. Electronically Signed   By: Marlan Palau M.D.   On: 08/03/2015 13:27   Ct Head Wo Contrast  07/30/2015  CLINICAL DATA:  Altered mental status.  LEFT eye twitching. EXAM: CT HEAD WITHOUT CONTRAST TECHNIQUE: Contiguous axial images were obtained from the base of the skull through the vertex without intravenous contrast. COMPARISON:  Brain MRI 06/17/2015, head CT 09/21/2011 FINDINGS: There is a an ovoid high-density extra-axial fluid collection along the LEFT parietal lobe measuring 3.2 cm by 1.3 cm (image 21, series 2). There is a larger low-density extra-axial fluid collection extending along the entirety of the LEFT cerebral hemisphere. Small amount of high-density fluid along the LEFT cerebellar tentorium additionally. There is mild rightward midline shift of 9 mm. There is mild compression of the LEFT lateral ventricle. There is generalized cortical atrophy. There is extensive periventricular white matter hypodensities. Paranasal sinuses and  mastoid air cells are clear. IMPRESSION: 1. Acute LEFT extra-axial hemorrhage (likely subdural) superimposed on a subacute LEFT subdural hematoma. Both findings are new from MRI of 06/18/2015. 2. Rightward midline shift of 9 mm. Mild compression of the LEFT lateral ventricle. 3. Small amount of subdural blood along the LEFT tentorium. 4. Extensive atrophy and white matter microvascular disease. Critical Value/emergent results were called by telephone at the time of interpretation on 07/30/2015 at 11:29 am to Dr. Linwood Dibbles , who verbally acknowledged these results. Electronically Signed   By: Genevive Bi M.D.   On: 07/30/2015 11:30   Admission HPI: Parker Hunter is a 81 y.o. male w/ PMHx of DM type II, h/o left frontal CVA (06/2015), HTN, CKD stage 3, CAD s/p stent placement, and dementia, brought to the ED by his wife for an acute change in his mental status. Patient has a history of  dementia and has a recent h/o CVA for which he has some let sided residual weakness, however, the wife states this AM, the patient was minimally responsive for a short time, was not talking like he usually does, and seemed more confused. He denied any discomfort, headaches, vision changes, nausea, dizziness, or lightheadedness. The patient was discharge after his stroke in 06/2015 to SNF. About a month ago during rehab, the patient sustained a fall for which it was thought he bumped his head. No falls reported by the family or the patient since that time. The patient is taking Plavix for his recent stroke and was supposed to be taking ASA as well, however, he has not been taking that since his discharge home from SNF about 2 weeks ago.   CT performed in the ED shows large left hemisphere hypodensity suggestive of an older/subacute subdural hematoma and left parietal hyperdensity suggestive of a new subdural hematoma.   Hospital Course by problem list:  Subdural hematoma Acute on chronic subdural hematoma identified on CT head 11/16. His course immediately prior to admission was concerning  for seizure related to this SDH in the morning and Keppra 500mg  BID was started. He was neurologically and hemodynamically stable throughout the hospital course. He was noted to have a pattern of reduced interaction particularly in mornings concerning for hypoactive delirium. All anticoagulation was discontinued due to bleeding. Repeat head CT on 11/20 showed mild progression of the hypodense component compared to admission. There was no gross increase in midline shift. He was started on a course of oral steroids with decadron. After discussion with family, the decision was made to further observe without surgical intervention at this time.  Discharge Vitals:   BP 136/75 mmHg  Pulse 99  Temp(Src) 98.5 F (36.9 C) (Oral)  Resp 18  Ht 5\' 6"  (1.676 m)  Wt 67.6 kg (149 lb 0.5 oz)  BMI 24.07 kg/m2  SpO2 98%  Discharge  Labs:  Results for orders placed or performed during the hospital encounter of 07/30/15 (from the past 24 hour(s))  Glucose, capillary     Status: Abnormal   Collection Time: 08/04/15  4:24 PM  Result Value Ref Range   Glucose-Capillary 143 (H) 65 - 99 mg/dL   Comment 1 Notify RN    Comment 2 Document in Chart   Glucose, capillary     Status: Abnormal   Collection Time: 08/04/15 10:14 PM  Result Value Ref Range   Glucose-Capillary 151 (H) 65 - 99 mg/dL   Comment 1 Notify RN    Comment 2 Document in Chart   Glucose, capillary     Status: Abnormal   Collection Time: 08/05/15  6:44 AM  Result Value Ref Range   Glucose-Capillary 165 (H) 65 - 99 mg/dL   Comment 1 Notify RN    Comment 2 Document in Chart   Glucose, capillary     Status: Abnormal   Collection Time: 08/05/15 11:33 AM  Result Value Ref Range   Glucose-Capillary 177 (H) 65 - 99 mg/dL   Comment 1 Notify RN    Comment 2 Document in Chart     Signed: Fuller Planhristopher W Ranee Peasley, MD 08/05/2015, 2:39 PM

## 2015-08-05 NOTE — Care Management Note (Signed)
Case Management Note  Patient Details  Name: Vickii ChafeRichard Taboada MRN: [id number] Date of Birth: 02-23-1927  Subjective/Objective:   Patient admitted with subdural hematoma. Patient is from home with his wife.                  Action/Plan: Plan is conservative management and SNF. CM will continue to follow for further discharge needs.   Expected Discharge Date:                  Expected Discharge Plan:  Skilled Nursing Facility  In-House Referral:  Clinical Social Work  Discharge planning Services  CM Consult  Post Acute Care Choice:    Choice offered to:     DME Arranged:    DME Agency:     HH Arranged:    HH Agency:     Status of Service:  In process, will continue to follow  Medicare Important Message Given:  Yes Date Medicare IM Given:    Medicare IM give by:    Date Additional Medicare IM Given:    Additional Medicare Important Message give by:     If discussed at Long Length of Stay Meetings, dates discussed:    Additional Comments:  Kermit BaloKelli F Corlene Sabia, RN 08/05/2015, 11:15 AM

## 2015-08-06 ENCOUNTER — Non-Acute Institutional Stay (SKILLED_NURSING_FACILITY): Payer: Medicare Other | Admitting: Adult Health

## 2015-08-06 DIAGNOSIS — I2510 Atherosclerotic heart disease of native coronary artery without angina pectoris: Secondary | ICD-10-CM | POA: Diagnosis not present

## 2015-08-06 DIAGNOSIS — F0151 Vascular dementia with behavioral disturbance: Secondary | ICD-10-CM

## 2015-08-06 DIAGNOSIS — S065XAA Traumatic subdural hemorrhage with loss of consciousness status unknown, initial encounter: Secondary | ICD-10-CM

## 2015-08-06 DIAGNOSIS — E1169 Type 2 diabetes mellitus with other specified complication: Secondary | ICD-10-CM | POA: Diagnosis not present

## 2015-08-06 DIAGNOSIS — I6320 Cerebral infarction due to unspecified occlusion or stenosis of unspecified precerebral arteries: Secondary | ICD-10-CM

## 2015-08-06 DIAGNOSIS — F01518 Vascular dementia, unspecified severity, with other behavioral disturbance: Secondary | ICD-10-CM

## 2015-08-06 DIAGNOSIS — E1149 Type 2 diabetes mellitus with other diabetic neurological complication: Secondary | ICD-10-CM

## 2015-08-06 DIAGNOSIS — S065X9A Traumatic subdural hemorrhage with loss of consciousness of unspecified duration, initial encounter: Secondary | ICD-10-CM

## 2015-08-06 DIAGNOSIS — N400 Enlarged prostate without lower urinary tract symptoms: Secondary | ICD-10-CM

## 2015-08-06 DIAGNOSIS — E785 Hyperlipidemia, unspecified: Secondary | ICD-10-CM | POA: Diagnosis not present

## 2015-08-06 DIAGNOSIS — F29 Unspecified psychosis not due to a substance or known physiological condition: Secondary | ICD-10-CM | POA: Diagnosis not present

## 2015-08-06 DIAGNOSIS — I6200 Nontraumatic subdural hemorrhage, unspecified: Secondary | ICD-10-CM | POA: Diagnosis not present

## 2015-08-07 ENCOUNTER — Encounter: Payer: Self-pay | Admitting: Adult Health

## 2015-08-07 DIAGNOSIS — F01518 Vascular dementia, unspecified severity, with other behavioral disturbance: Secondary | ICD-10-CM | POA: Insufficient documentation

## 2015-08-07 DIAGNOSIS — F0151 Vascular dementia with behavioral disturbance: Secondary | ICD-10-CM | POA: Insufficient documentation

## 2015-08-07 DIAGNOSIS — E1169 Type 2 diabetes mellitus with other specified complication: Secondary | ICD-10-CM | POA: Insufficient documentation

## 2015-08-07 DIAGNOSIS — F29 Unspecified psychosis not due to a substance or known physiological condition: Secondary | ICD-10-CM | POA: Insufficient documentation

## 2015-08-07 DIAGNOSIS — E785 Hyperlipidemia, unspecified: Secondary | ICD-10-CM

## 2015-08-07 NOTE — Progress Notes (Signed)
Patient ID: Parker ChafeRichard Hunter, male   DOB: 1927/01/07, 81 y.o.   MRN: [id number]   Facility:  [facility]      Allergies  Allergen Reactions  . Ibuprofen Other (See Comments)    GI Bleeding    Chief Complaint  Patient presents with  . Hospitalization Follow-up    HPI:  He has been hospitalized for acute on chronic subdural hematoma. He had a cva in Oct 2016. He is unable to participate in the hpi or ros. He is here for rehab. More than likely this does represent a long term placement for him.    Past Medical History  Diagnosis Date  . Diabetes mellitus without complication (HCC)   . TIA (transient ischemic attack)   . Neuropathy (HCC)   . Status post dilation of esophageal narrowing   . Hypertension   . Stented coronary artery   . Stroke [facility](HCC)     Past Surgical History  Procedure Laterality Date  . Cardiac catheterization    . Hernia repair    . Prostate surgery    . Eye surgery    . Coronary angioplasty    . Esphageal dilation      VITAL SIGNS BP 140/75 mmHg  Pulse 98  Ht 5\' 6"  (1.676 m)  Wt 149 lb (67.586 kg)  BMI 24.06 kg/m2  SpO2 98%  Patient's Medications  New Prescriptions   No medications on file  Previous Medications   ATORVASTATIN (LIPITOR) 40 MG TABLET    Take 40 mg by mouth daily.   GLIPIZIDE (GLUCOTROL XL) 10 MG 24 HR TABLET    Take 10 mg by mouth daily.   LORAZEPAM (ATIVAN) 0.5 MG TABLET    Take 1 tablet (0.5 mg total) by mouth at bedtime as needed for anxiety.   MELATONIN 10 MG TABS    Take 10 mg by mouth every evening.   METHYLPREDNISOLONE (MEDROL DOSEPAK) 4 MG TBPK TABLET    Take 3 tabs daily for 4 days Take 2 tabs daily for 3 days Take 1 tab daily for 3 days   OMEPRAZOLE (PRILOSEC) 20 MG CAPSULE    Take 20 mg by mouth daily.   RISPERIDONE (RISPERDAL) 0.5 MG TABLET    Take 1 tablet (0.5 mg total) by mouth at bedtime.   TAMSULOSIN (FLOMAX) 0.4 MG CAPS CAPSULE    Take 1 capsule (0.4 mg total) by mouth daily.  Modified Medications   No  medications on file  Discontinued Medications   No medications on file     SIGNIFICANT DIAGNOSTIC EXAMS  06-18-15: 2-d echo: Technically difficult study. LVEF 55-60%, grossly normal wall motion, mild LVH, diastolic dysfunction with normal LV filling pressure, mild MR, top normal LA size.  07-30-15: ct of head: 1. Acute LEFT extra-axial hemorrhage (likely subdural) superimposed on a subacute LEFT subdural hematoma. Both findings are new from MRI of 06/18/2015. 2. Rightward midline shift of 9 mm. Mild compression of the LEFT lateral ventricle. 3. Small amount of subdural blood along the LEFT tentorium. 4. Extensive atrophy and white matter microvascular disease.  08-03-15: ct of head: Mixed density subdural hematoma on the left. The low-density component appears to have enlarged in the interval. The high-density component is stable. There is a small left tentorial subdural hematoma. 6.5 mm midline shift to the right is unchanged. Atrophy and chronic ischemia especially on the right.     LABS REVIEWED:   06-18-15: hgb a1c 7.0; chol 145; ldl 92; trig 121; hdl 29 07-30-15: wbc 5.5; hgb 10.1;  hct 30.2; mcv 85.6; plt 140; glucose 130; bun 16; creat 1.27; k+ 3.8; na++140; liver normal albumin 2.7 07-31-15: wbc 5.4; hgb 10.8; hct 32.3; mcv 86.8; plt 150; glucose 92; bun 13; creat 1.19; k+ 3.9; na++140 08-01-15: wbc 6.6; hgb 11.3; hct 32.7; mcv 85.2; plt 144; glucose 140; bun 17; creat 1.24; k+ 3.7; na++137 08-04-15: wbc 6.4; hgb 10.9; hct 32.4; mcv 85.5; plt 167; glucose 149; bun 22; creat 1.35; k+ 3.8; na++137     Review of Systems  Unable to perform ROS: dementia     Physical Exam  Constitutional: No distress.  Thin   Eyes: Conjunctivae are normal.  Neck: Neck supple. No JVD present. No thyromegaly present.  Cardiovascular: Normal rate, regular rhythm and intact distal pulses.   Respiratory: Effort normal and breath sounds normal. No respiratory distress. He has no wheezes.  GI:  Soft. Bowel sounds are normal. He exhibits no distension. There is no tenderness.  Musculoskeletal: He exhibits no edema.  Able to move all extremities  Has left sided weakness present   Lymphadenopathy:    He has no cervical adenopathy.  Neurological:  Is lethargic this AM   Skin: Skin is warm and dry. He is not diaphoretic.  Psychiatric: He has a normal mood and affect.     ASSESSMENT/ PLAN:  1. Diabetes: his hgb a1c is 7.0; will continue glipizide er 10 mg daily   2. Dyslipidemia: ldl is 92; will lower lipitor to 20 mg daily   3. Acute on chronic subdural hematoma: will continue methylprednisolone taper though 08-16-15. He will need to follow up with neurology in 2 weeks with a ct scan. He may be able to restart asa at that time.   4. CVA: without neurological change. Is presently not on medications will not make changes will monitor  5. GERD; will continue prilosec 20 mg daily   6. BPH: will continue flomax daily   7. Vascular dementia: is advanced stage. Is presently not on medications will monitor his current weight is 149 pounds   8. Psychosis:  Will continue risperdal 0.5 mg nightly will continue melatonin 10 mg nightly for sleep and has ativan 0.5 mg nightly for anxiety   9. CAD status post stent: no signs of chest pain present; will monitor     Time spent with patient  50  minutes >50% time spent counseling; reviewing medical record; tests; labs; and developing future plan of care    Synthia Innocent NP [facility]  Contact [phone] Monday through Friday 8am- 5pm  After hours call [phone]

## 2015-08-11 ENCOUNTER — Non-Acute Institutional Stay (SKILLED_NURSING_FACILITY): Payer: Medicare Other | Admitting: Internal Medicine

## 2015-08-11 DIAGNOSIS — F29 Unspecified psychosis not due to a substance or known physiological condition: Secondary | ICD-10-CM | POA: Diagnosis not present

## 2015-08-11 DIAGNOSIS — F01518 Vascular dementia, unspecified severity, with other behavioral disturbance: Secondary | ICD-10-CM

## 2015-08-11 DIAGNOSIS — N400 Enlarged prostate without lower urinary tract symptoms: Secondary | ICD-10-CM | POA: Diagnosis not present

## 2015-08-11 DIAGNOSIS — E1149 Type 2 diabetes mellitus with other diabetic neurological complication: Secondary | ICD-10-CM

## 2015-08-11 DIAGNOSIS — F0151 Vascular dementia with behavioral disturbance: Secondary | ICD-10-CM | POA: Diagnosis not present

## 2015-08-11 DIAGNOSIS — E785 Hyperlipidemia, unspecified: Secondary | ICD-10-CM | POA: Diagnosis not present

## 2015-08-11 DIAGNOSIS — I6200 Nontraumatic subdural hemorrhage, unspecified: Secondary | ICD-10-CM

## 2015-08-11 DIAGNOSIS — K219 Gastro-esophageal reflux disease without esophagitis: Secondary | ICD-10-CM | POA: Diagnosis not present

## 2015-08-11 DIAGNOSIS — S065XAA Traumatic subdural hemorrhage with loss of consciousness status unknown, initial encounter: Secondary | ICD-10-CM

## 2015-08-11 DIAGNOSIS — E1169 Type 2 diabetes mellitus with other specified complication: Secondary | ICD-10-CM | POA: Diagnosis not present

## 2015-08-11 DIAGNOSIS — S065X9A Traumatic subdural hemorrhage with loss of consciousness of unspecified duration, initial encounter: Secondary | ICD-10-CM

## 2015-08-11 NOTE — Progress Notes (Signed)
MRN: [id number] Name: Parker Hunter  Sex: male Age: 81 y.o. DOB: 1926-12-27  PSC #: Ronni Rumble Facility/Room:121 Level Of Care: SNF Provider: Merrilee Seashore D Emergency Contacts: Extended Emergency Contact Information Primary Emergency Contact: Kuiken,[name] Address: [address]          [place]  [address] Home Phone: [phone] Relation: None Secondary Emergency Contact: Herbert Seta [place] Mobile Phone: [phone] Relation: None  Code Status:   Allergies: Ibuprofen  Chief Complaint  Patient presents with  . New Admit To SNF    HPI: Patient is 81 y.o. male with DM type II, h/o left frontal CVA (06/2015), HTN, CKD stage 3, CAD s/p stent placement, and dementia, brought to the ED by his wife for an acute change in his mental status. Patient has a history of dementia and has a recent h/o CVA for which he has some let sided residual weakness, however, the wife states this AM, the patient was minimally responsive for a short time, was not talking like he usually does, and seemed more confused. He denied any discomfort, headaches, vision changes, nausea, dizziness, or lightheadedness. The patient was discharge after his stroke in 06/2015 to SNF. About a month ago during rehab, the patient sustained a fall for which it was thought he bumped his head. No falls reported by the family or the patient since that time. The patient is taking Plavix for his recent stroke and was supposed to be taking ASA as well, however, he has not been taking that since his discharge home from SNF about 2 weeks ago.   CT performed in the ED shows large left hemisphere hypodensity suggestive of an older/subacute subdural hematoma and left parietal hyperdensity suggestive of a new subdural hematoma. Pt was admitted to [facility] from 11/16-22 for SDH. He was started on Keppra. Ct showed some worsening of hematoma but family desired no surgical intervention. Course was complicated by waxing and waning  delirium. Pt is admitted to SNF for generalized weakness and supportive care. While at SNF pt will be followed for DM2, tx with glipzide, HLD, tx with lipitor and and BPH, tx with flomax.   Past Medical History  Diagnosis Date  . Diabetes mellitus without complication (HCC)   . TIA (transient ischemic attack)   . Neuropathy (HCC)   . Status post dilation of esophageal narrowing   . Hypertension   . Stented coronary artery   . Stroke [facility])     Past Surgical History  Procedure Laterality Date  . Cardiac catheterization    . Hernia repair    . Prostate surgery    . Eye surgery    . Coronary angioplasty    . Esphageal dilation        Medication List       This list is accurate as of: 08/11/15 11:59 PM.  Always use your most recent med list.               atorvastatin 40 MG tablet  Commonly known as:  LIPITOR  Take 40 mg by mouth daily.     glipiZIDE 10 MG 24 hr tablet  Commonly known as:  GLUCOTROL XL  Take 10 mg by mouth daily.     LORazepam 0.5 MG tablet  Commonly known as:  ATIVAN  Take 1 tablet (0.5 mg total) by mouth at bedtime as needed for anxiety.     Melatonin 10 MG Tabs  Take 10 mg by mouth every evening.     methylPREDNISolone 4  MG Tbpk tablet  Commonly known as:  MEDROL DOSEPAK  Take 3 tabs daily for 4 days Take 2 tabs daily for 3 days Take 1 tab daily for 3 days     omeprazole 20 MG capsule  Commonly known as:  PRILOSEC  Take 20 mg by mouth daily.     risperiDONE 0.5 MG tablet  Commonly known as:  RISPERDAL  Take 1 tablet (0.5 mg total) by mouth at bedtime.     tamsulosin 0.4 MG Caps capsule  Commonly known as:  FLOMAX  Take 1 capsule (0.4 mg total) by mouth daily.        No orders of the defined types were placed in this encounter.     There is no immunization history on file for this patient.  Social History  Substance Use Topics  . Smoking status: Former Games developermoker  . Smokeless tobacco: Not on file  . Alcohol Use: Yes     Comment:  daily    Family history is + stroke, DM   Review of Systems - pt unable to participate; nursing without concers     Filed Vitals:   08/13/15 0916  BP: 163/54  Pulse: 67  Temp: 98.9 F (37.2 C)  Resp: 22    SpO2 Readings from Last 1 Encounters:  08/06/15 98%        Physical Exam  GENERAL APPEARANCE: Alert, min conversant,  No acute distress.  SKIN: No diaphoresis rash HEAD: Normocephalic, atraumatic  EYES: Conjunctiva/lids clear. Pupils round, reactive. EOMs intact.  EARS: External exam WNL, canals clear. Hearing grossly normal.  NOSE: No deformity or discharge.  MOUTH/THROAT: Lips w/o lesions  RESPIRATORY: Breathing is even, unlabored. Lung sounds are clear   CARDIOVASCULAR: Heart RRR no murmurs, rubs or gallops. No peripheral edema.   GASTROINTESTINAL: Abdomen is soft, non-tender, not distended w/ normal bowel sounds. GENITOURINARY: Bladder non tender, not distended  MUSCULOSKELETAL: No abnormal joints or musculature NEUROLOGIC:  Cranial nerves 2-12 grossly intact. L side  weakness; pt can answer simple questions yes or no  PSYCHIATRIC: dementia, no behavioral issues  Patient Active Problem List   Diagnosis Date Noted  . GERD (gastroesophageal reflux disease) 08/13/2015  . Vascular dementia with behavior disturbance 08/07/2015  . Dyslipidemia associated with type 2 diabetes mellitus (HCC) 08/07/2015  . 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 Subdural hematoma (HCC) 07/30/2015  . Dementia 06/23/2015  . BPH (benign prostatic hypertrophy) 06/23/2015  . CAD (coronary artery disease) s/p stent 06/18/2015  . Diabetes mellitus type 2, controlled (HCC) 06/18/2015  . Hyperlipidemia 06/18/2015  . Diabetes mellitus type 2 with neurological manifestations (HCC) 06/18/2015  . Cerebrovascular accident (CVA) (HCC)   . Intracranial carotid stenosis   . Stroke (HCC) 06/17/2015    CBC    Component Value Date/Time   WBC 6.4 08/04/2015 0525   RBC 3.79* 08/04/2015 0525   HGB  10.9* 08/04/2015 0525   HCT 32.4* 08/04/2015 0525   PLT 167 08/04/2015 0525   MCV 85.5 08/04/2015 0525   LYMPHSABS 1.4 07/30/2015 1030   MONOABS 0.4 07/30/2015 1030   EOSABS 0.6 07/30/2015 1030   BASOSABS 0.0 07/30/2015 1030    CMP     Component Value Date/Time   NA 137 08/04/2015 0525   K 3.8 08/04/2015 0525   CL 101 08/04/2015 0525   CO2 29 08/04/2015 0525   GLUCOSE 149* 08/04/2015 0525   BUN 22* 08/04/2015 0525   CREATININE 1.35* 08/04/2015 0525   CALCIUM 8.5* 08/04/2015 0525   PROT 5.8*  07/30/2015 1021   ALBUMIN 2.7* 07/30/2015 1021   AST 18 07/30/2015 1021   ALT 14* 07/30/2015 1021   ALKPHOS 97 07/30/2015 1021   BILITOT 0.5 07/30/2015 1021   GFRNONAA 46* 08/04/2015 0525   GFRAA 53* 08/04/2015 0525    Lab Results  Component Value Date   HGBA1C 7.0* 06/18/2015     Ct Head Wo Contrast  07/30/2015  CLINICAL DATA:  Altered mental status.  LEFT eye twitching. EXAM: CT HEAD WITHOUT CONTRAST TECHNIQUE: Contiguous axial images were obtained from the base of the skull through the vertex without intravenous contrast. COMPARISON:  Brain MRI 06/17/2015, head CT 09/21/2011 FINDINGS: There is a an ovoid high-density extra-axial fluid collection along the LEFT parietal lobe measuring 3.2 cm by 1.3 cm (image 21, series 2). There is a larger low-density extra-axial fluid collection extending along the entirety of the LEFT cerebral hemisphere. Small amount of high-density fluid along the LEFT cerebellar tentorium additionally. There is mild rightward midline shift of 9 mm. There is mild compression of the LEFT lateral ventricle. There is generalized cortical atrophy. There is extensive periventricular white matter hypodensities. Paranasal sinuses and  mastoid air cells are clear. IMPRESSION: 1. Acute LEFT extra-axial hemorrhage (likely subdural) superimposed on a subacute LEFT subdural hematoma. Both findings are new from MRI of 06/18/2015. 2. Rightward midline shift of 9 mm. Mild compression  of the LEFT lateral ventricle. 3. Small amount of subdural blood along the LEFT tentorium. 4. Extensive atrophy and white matter microvascular disease. Critical Value/emergent results were called by telephone at the time of interpretation on 07/30/2015 at 11:29 am to Dr. Linwood Dibbles , who verbally acknowledged these results. Electronically Signed   By: Genevive Bi M.D.   On: 07/30/2015 11:30    Not all labs, radiology exams or other studies done during hospitalization come through on my EPIC note; however they are reviewed by me.    Assessment and Plan  Subdural hematoma (HCC) Left sided subdural hematoma with a known provoking incident causing small stable R parietal bleed, with a repeat larger R acute bleed without known provoking incident. He was on dual antiplatelet therapy due to recent stroke in 06/2015. Medrol dose pack to be started at 12-8-4mg  of oral methylprednisolone over the next 10 days. All anticoagulation to be held. Possible resumption of ASA  2 weeks after radiographic evidence of bleed stabilization and if no surgery recommended  Vascular dementia with behavior disturbance Reported  pronounced psychomotor retardation in the mornings and agitation in evenings. SNF - cont  risperdal 0.5mg  qhs plus repeat dosing as needed, with attempting to avoid benzodiazepines as a contributor to his worsened delirium during hospital course.  BPH (benign prostatic hypertrophy) SNF  Stable, cont flomax  Dyslipidemia associated with type 2 diabetes mellitus (HCC) SNF - LDL 92, HDL 29 on lipitor 40 mg daily ;plan - cont lipitor  Diabetes mellitus type 2 with neurological manifestations (HCC) SNF- Hb A1c 7.2 on glipizide 24 hr 10 mg daily; pt is on statin, not on ACE  Nonorganic psychosis SNF - cont risperdal at night  GERD (gastroesophageal reflux disease) SNF - stable- cont prilosec 0 mg daily   Time spent 45 min;> 50% of time with patient was spent reviewing records, labs,  tests and studies, counseling and developing plan of care  Margit Hanks, MD

## 2015-08-11 NOTE — Assessment & Plan Note (Signed)
Left sided subdural hematoma with a known provoking incident causing small stable R parietal bleed, with a repeat larger R acute bleed without known provoking incident. He was on dual antiplatelet therapy due to recent stroke in 06/2015. Medrol dose pack to be started at 12-8-4mg  of oral methylprednisolone over the next 10 days. All anticoagulation to be held. Possible resumption of ASA 81mg  2 weeks after radiographic evidence of bleed stabilization and if no surgery recommended

## 2015-08-11 NOTE — Assessment & Plan Note (Signed)
Reported  pronounced psychomotor retardation in the mornings and agitation in evenings. SNF - cont  risperdal 0.5mg  qhs plus repeat dosing as needed, with attempting to avoid benzodiazepines as a contributor to his worsened delirium during hospital course.

## 2015-08-13 ENCOUNTER — Ambulatory Visit: Payer: 59 | Admitting: Neurology

## 2015-08-13 ENCOUNTER — Encounter: Payer: Self-pay | Admitting: Internal Medicine

## 2015-08-13 DIAGNOSIS — K219 Gastro-esophageal reflux disease without esophagitis: Secondary | ICD-10-CM | POA: Insufficient documentation

## 2015-08-13 NOTE — Assessment & Plan Note (Signed)
SNF - stable- cont prilosec 0 mg daily

## 2015-08-13 NOTE — Assessment & Plan Note (Signed)
SNF - cont risperdal at night

## 2015-08-13 NOTE — Assessment & Plan Note (Signed)
SNF  Stable, cont flomax

## 2015-08-13 NOTE — Assessment & Plan Note (Signed)
SNF - LDL 92, HDL 29 on lipitor 40 mg daily ;plan - cont lipitor

## 2015-08-13 NOTE — Assessment & Plan Note (Signed)
SNF- Hb A1c 7.2 on glipizide 24 hr 10 mg daily; pt is on statin, not on ACE

## 2015-08-27 ENCOUNTER — Encounter: Payer: Self-pay | Admitting: Neurology

## 2015-08-27 ENCOUNTER — Ambulatory Visit (INDEPENDENT_AMBULATORY_CARE_PROVIDER_SITE_OTHER): Payer: Medicare Other | Admitting: Neurology

## 2015-08-27 VITALS — BP 122/55 | HR 96 | Ht 66.0 in

## 2015-08-27 DIAGNOSIS — E1159 Type 2 diabetes mellitus with other circulatory complications: Secondary | ICD-10-CM

## 2015-08-27 DIAGNOSIS — I10 Essential (primary) hypertension: Secondary | ICD-10-CM | POA: Diagnosis not present

## 2015-08-27 DIAGNOSIS — F0391 Unspecified dementia with behavioral disturbance: Secondary | ICD-10-CM | POA: Diagnosis not present

## 2015-08-27 DIAGNOSIS — I6200 Nontraumatic subdural hemorrhage, unspecified: Secondary | ICD-10-CM

## 2015-08-27 DIAGNOSIS — S065X9A Traumatic subdural hemorrhage with loss of consciousness of unspecified duration, initial encounter: Secondary | ICD-10-CM

## 2015-08-27 DIAGNOSIS — I63512 Cerebral infarction due to unspecified occlusion or stenosis of left middle cerebral artery: Secondary | ICD-10-CM | POA: Diagnosis not present

## 2015-08-27 DIAGNOSIS — R569 Unspecified convulsions: Secondary | ICD-10-CM | POA: Insufficient documentation

## 2015-08-27 DIAGNOSIS — F03918 Unspecified dementia, unspecified severity, with other behavioral disturbance: Secondary | ICD-10-CM

## 2015-08-27 DIAGNOSIS — I639 Cerebral infarction, unspecified: Secondary | ICD-10-CM | POA: Diagnosis not present

## 2015-08-27 DIAGNOSIS — S065XAA Traumatic subdural hemorrhage with loss of consciousness status unknown, initial encounter: Secondary | ICD-10-CM

## 2015-08-27 NOTE — Patient Instructions (Signed)
-   continue lipitor now for stroke prevention - will repeat head CT to evaluate the status of brain bleeding in November - will do EEG to rule out seizure  - will refer to neurosurgery for follow up - if CT showed bleeding resolved or much improved, would like to re-start ASA for stroke prevention - Follow up with physician in facility for stroke risk factor modification. Recommend maintain blood pressure goal <130/80, diabetes with hemoglobin A1c goal below 6.5% and lipids with LDL cholesterol goal below 70 mg/dL.  - check BP and glucose in facility  - follow up in 3 months.

## 2015-08-27 NOTE — Progress Notes (Signed)
STROKE NEUROLOGY FOLLOW UP NOTE  NAME: Parker Hunter DOB: July 07, 1927  REASON FOR VISIT: stroke follow up HISTORY FROM: wife and chart  Today we had the pleasure of seeing Parker Hunter in follow-up at our Neurology Clinic. Pt was accompanied by wife.   History Summary Parker Hunter is a 81 y.o. male with history of DM, TIA, HTN and CAD was admitted on 06/17/15 for recurrent falls and gait instability for several months. MRI showed punctate L parietal and patchy frontal cortical watershed infarcts as well as old R frontal, R BG and ? R cerebellar infarcts. MRA showed focal left M1 high grade stenosis and multifocal atheromatous irregularity. Stroke felt more likely due to large vessel athero and stenosis, less likely embolic but not able to rule out completely. Stroke work up with CUS and 2D echo not remarkable. A1C 7.0 and LDL 92. Due to intracranial stenosis, he was put on dual antiplatelet with statin on discharge. He was sent to SNF for further rehab.  Interval History During the interval time, the patient has not been doing well. He had multiple falls in SNF, and at least one fall with hitting his head. He was readmitted on 07/30/2015 with altered mental status, not able to talk, slurry speech, less responsive. As per wife, patient started to be more responsive ER. CT head was done showing left subdural hematoma. Neurosurgery is consulted, did not offer surgical intervention. Repeat CT head shows stable SDH. There was a concerning for seizure activity on the initial presentation, however no seizure medication was initiated. No EEG was done. After stabilization, he was sent back to SNF with planning of neurosurgery follow-up in 2 weeks. However, no NSG follow up so far yet. His ASA and plavix were discontinued and so far no antiplatelet resumed yet.  As per wife, patient had left-sided hemiparesis from previous strokes, however since the recent admission, patient seems to be more weak on the  right side. Not able to walk with walker independently in SNF. Still has PT OT in SNF. Also, wife is concerning for his dementia now as he is not able to follow commands or have good conversation with her. BP today 122/55.  REVIEW OF SYSTEMS: Full 14 system review of systems performed and notable only for those listed below and in HPI above, all others are negative:  Constitutional:  Fatigue Cardiovascular:  Ear/Nose/Throat:  Trouble swallowing Skin:  Moles Eyes:   Respiratory:  Cough, snoring Gastroitestinal:  Incontinence Genitourinary:  Incontinence Hematology/Lymphatic:  Easy bruising, easy bleeding Endocrine:  Feeling cold Musculoskeletal:  Aching muscles Allergy/Immunology:  Runny nose Neurological:  Memory loss, confusion, weakness, difficulty swallowing Psychiatric:  Depression, anxiety, change in appetite, disinterest in activities Sleep:  Sleepiness, snoring  The following represents the patient's updated allergies and side effects list: Allergies  Allergen Reactions  . Ibuprofen Other (See Comments)    GI Bleeding    The neurologically relevant items on the patient's problem list were reviewed on today's visit.  Neurologic Examination  A problem focused neurological exam (12 or more points of the single system neurologic examination, vital signs counts as 1 point, cranial nerves count for 8 points) was performed.  Blood pressure 122/55, pulse 96, height 5\' 6"  (1.676 m).  General - Well nourished, well developed, mild restless in wheelchair.  Ophthalmologic - Fundi not visualized due to  incorporation.  Cardiovascular - Regular rate and rhythm.  Mental Status -  awake, alert, not orientated to time, place or situation, but orientated to  people. Intermittently follow simple commands, paucity of speech, not cooperative on naming or repetition.  Cranial Nerves II - XII - II - blinking to visual threat bilaterally. III, IV, VI - Extraocular movements intact. V -  not cooperative on exam VII - Facial movement intact bilaterally. VIII - Hearing & vestibular intact bilaterally. X - not cooperative on exam. XI - not cooperative on exam. XII - Tongue protrusion intact.  Motor Strength - The pmoving all extremities spontaneously, however, RUE with drift and 3-/5 on exam. All other extremities 4/5, but largely not cooperative on exam.   Bulk was normal and fasciculations were absent.   Motor Tone - Muscle tone was assessed at the neck and appendages and was normal.  Reflexes - The patient's reflexes were 1+ in all extremities and he had no pathological reflexes.  Sensory - not cooperative on exam.    Coordination -  not cooperative on exam.  Tremor was absent.  Gait and Station -  in wheelchair, gait not tested .  Data reviewed: I personally reviewed the images and agree with the radiology interpretations.  Mr Brain Wo Contrast 06/17/2015 1. Motion degraded study. 2. Patchy acute ischemic infarcts involving the cortical gray matter and deep/periventricular white matter of the left parietal lobe extending anteriorly into the left frontal lobe as above. 3. No other definite acute intracranial process. 4. Remote infarcts involving the anterior right frontal lobe, right basal ganglia/corona radiata, and possibly right cerebellar hemisphere. 5. Advanced age-related cerebral atrophy with moderate chronic microvascular ischemic disease.   Mr Maxine Glenn Head/brain Wo Cm 06/18/2015 1. No large or proximal arterial branch occlusion within the intracranial circulation. 2. Focal high-grade severe stenosis within the distal left M1 segment measuring 2 mm in length. 3. Additional fairly advanced multi focal atheromatous irregularity throughout the intracranial circulation as above.   Carotid Doppler There is 1-39% bilateral ICA stenosis. Vertebral artery flow is antegrade.   2D echo - Technically difficult study. LVEF 55-60%, grossly normal wall motion, mild  LVH, diastolic dysfunction with normal LV filling pressure, mild MR, top normal LA size.  CT head 07/30/15 - 1. Acute LEFT extra-axial hemorrhage (likely subdural) superimposed on a subacute LEFT subdural hematoma. Both findings are new from MRI of 06/18/2015. 2. Rightward midline shift of 9 mm. Mild compression of the LEFT lateral ventricle. 3. Small amount of subdural blood along the LEFT tentorium. 4. Extensive atrophy and white matter microvascular disease.  CT head 08/03/15 - Mixed density subdural hematoma on the left. The low-density component appears to have enlarged in the interval. The high-density component is stable. There is a small left tentorial subdural Hematoma. 6.5 mm midline shift to the right is unchanged. Atrophy and chronic ischemia especially on the right.  Component     Latest Ref Rng 06/18/2015  Cholesterol     0 - 200 mg/dL 742  Triglycerides     <150 mg/dL 595  HDL Cholesterol     >40 mg/dL 29 (L)  Total CHOL/HDL Ratio      5.0  VLDL     0 - 40 mg/dL 24  LDL (calc)     0 - 99 mg/dL 92  Hemoglobin G3O     4.8 - 5.6 % 7.0 (H)  Mean Plasma Glucose      154    Assessment: As you may recall, he is a 81 y.o. Caucasian male with PMH of DM, TIA, HTN and CAD was admitted on 06/17/15 for punctate L parietal and patchy frontal  cortical watershed infarcts as well as old R frontal, R BG and ? R cerebellar infarcts. MRA showed focal left M1 high grade stenosis and multifocal atheromatous irregularity. Stroke felt more likely due to large vessel athero and stenosis, less likely embolic but not able to rule out completely. Stroke work up with CUS and 2D echo not remarkable. A1C 7.0 and LDL 92. Due to intracranial stenosis, he was put on dual antiplatelet with statin on discharge. He was sent to SNF for further rehab. He was readmitted on 07/30/2015 with altered mental status, concerning for seizure activity. CT head showed left SDH. Neurosurgery is consulted, no surgical  intervention. Repeat CT head shows stable SDH and improved midline shift. No seizure medication or EEG was done, and no NSG follow up so far yet. Pt seems had generalized weakness but RUE was weaker but he does have significant cognitive impairment. His ASA and plavix so far all stopped due to SDH but has not addressed about timing for resumption, at least ASA for stroke prevention.   Plan:  - continue lipitor now for stroke prevention - repeat head CT to evaluate SDH - EEG to rule out seizure  - refer to neurosurgery for follow up - depending on the CT result, would like to re-start ASA for stroke prevention - Follow up with physician in facility for stroke risk factor modification. Recommend maintain blood pressure goal <130/80, diabetes with hemoglobin A1c goal below 6.5% and lipids with LDL cholesterol goal below 70 mg/dL.  - check BP and glucose in the SNF   - follow up in 3 months.  I spent more than 25 minutes of face to face time with the patient. Greater than 50% of time was spent in counseling and coordination of care. We have discussed about further work up for SDH and seizure.   Orders Placed This Encounter  Procedures  . CT Head Wo Contrast    Standing Status: Future     Number of Occurrences:      Standing Expiration Date: 11/24/2016    Order Specific Question:  Reason for Exam (SYMPTOM  OR DIAGNOSIS REQUIRED)    Answer:  SDH follow up    Order Specific Question:  Preferred imaging location?    Answer:  Internal  . Ambulatory referral to Neurosurgery    Referral Priority:  Urgent    Referral Type:  Surgical    Referral Reason:  Specialty Services Required    Requested Specialty:  Neurosurgery    Number of Visits Requested:  1  . EEG adult    Standing Status: Future     Number of Occurrences:      Standing Expiration Date: 08/26/2016    Order Specific Question:  Where should this test be performed?    Answer:  [facility]    No orders of the defined types were placed in  this encounter.    Patient Instructions  - continue lipitor now for stroke prevention - will repeat head CT to evaluate the status of brain bleeding in November - will do EEG to rule out seizure  - will refer to neurosurgery for follow up - if CT showed bleeding resolved or much improved, would like to re-start ASA for stroke prevention - Follow up with physician in facility for stroke risk factor modification. Recommend maintain blood pressure goal <130/80, diabetes with hemoglobin A1c goal below 6.5% and lipids with LDL cholesterol goal below 70 mg/dL.  - check BP and glucose in facility  - follow up  in 3 months.     Marvel Plan, MD PhD [facility] [address], [address] [place], [place] [address] [phone]

## 2015-09-04 ENCOUNTER — Non-Acute Institutional Stay (SKILLED_NURSING_FACILITY): Payer: Medicare Other | Admitting: Internal Medicine

## 2015-09-04 DIAGNOSIS — I6200 Nontraumatic subdural hemorrhage, unspecified: Secondary | ICD-10-CM | POA: Diagnosis not present

## 2015-09-04 DIAGNOSIS — E1149 Type 2 diabetes mellitus with other diabetic neurological complication: Secondary | ICD-10-CM

## 2015-09-04 DIAGNOSIS — S065X9A Traumatic subdural hemorrhage with loss of consciousness of unspecified duration, initial encounter: Secondary | ICD-10-CM

## 2015-09-04 DIAGNOSIS — E785 Hyperlipidemia, unspecified: Secondary | ICD-10-CM

## 2015-09-04 DIAGNOSIS — S065XAA Traumatic subdural hemorrhage with loss of consciousness status unknown, initial encounter: Secondary | ICD-10-CM

## 2015-09-09 ENCOUNTER — Encounter: Payer: Self-pay | Admitting: Internal Medicine

## 2015-09-09 ENCOUNTER — Ambulatory Visit
Admission: RE | Admit: 2015-09-09 | Discharge: 2015-09-09 | Disposition: A | Discharge status: Home / Self Care | Payer: Medicare Other | Source: Ambulatory Visit | Attending: Neurology | Admitting: Neurology

## 2015-09-09 DIAGNOSIS — S065XAA Traumatic subdural hemorrhage with loss of consciousness status unknown, initial encounter: Secondary | ICD-10-CM

## 2015-09-09 DIAGNOSIS — S065X9A Traumatic subdural hemorrhage with loss of consciousness of unspecified duration, initial encounter: Secondary | ICD-10-CM

## 2015-09-09 DIAGNOSIS — I6200 Nontraumatic subdural hemorrhage, unspecified: Secondary | ICD-10-CM | POA: Diagnosis not present

## 2015-09-09 NOTE — Assessment & Plan Note (Signed)
LDL - 92, HDL 29; NP lowered dose to 20 mg daily, will see what difference that makes in 6 weeks. Neurology would like tight control, but pt 88 with ongoing dementia, tight control may not be desirable in the big picture

## 2015-09-09 NOTE — Progress Notes (Signed)
MRN: [id number] Name: Parker Hunter  Sex: male Age: 81 y.o. DOB: 04/08/1927  [facility] Level Of Care: SNF Provider: Merrilee SeashoreALEXANDER, Tayra Dawe D Emergency Contacts: Extended Emergency Contact Information Primary Emergency Contact: Finch,[name] Address: [address]          [place],  [id number] Home Phone: [phone] Relation: None Secondary Emergency Contact: Herbert SetaYard,Robin  [place] Mobile Phone: [phone] Relation: None  Code Status:   Allergies: Ibuprofen  Chief Complaint  Patient presents with  . Medical Management of Chronic Issues    HPI: Patient is 81 y.o. male with DM type II, h/o left frontal CVA (06/2015), HTN, CKD stage 3, CAD s/p stent placement, and dementia, last hosp dx with subacute and chronic SDH who is being seen today for routine issue of SDH, HLD and DM2.  Past Medical History  Diagnosis Date  . Diabetes mellitus without complication (HCC)   . TIA (transient ischemic attack)   . Neuropathy (HCC)   . Status post dilation of esophageal narrowing   . Hypertension   . Stented coronary artery   . Stroke [facility](HCC)     Past Surgical History  Procedure Laterality Date  . Cardiac catheterization    . Hernia repair    . Prostate surgery    . Eye surgery    . Coronary angioplasty    . Esphageal dilation        Medication List       This list is accurate as of: 09/04/15 11:59 PM.  Always use your most recent med list.               atorvastatin 40 MG tablet  Commonly known as:  LIPITOR  Take 40 mg by mouth daily.     glipiZIDE 10 MG 24 hr tablet  Commonly known as:  GLUCOTROL XL  Take 10 mg by mouth daily.     LORazepam 0.5 MG tablet  Commonly known as:  ATIVAN  Take 1 tablet (0.5 mg total) by mouth at bedtime as needed for anxiety.     Melatonin 10 MG Tabs  Take 10 mg by mouth every evening.     omeprazole 20 MG capsule  Commonly known as:  PRILOSEC  Take 20 mg by mouth daily.     risperiDONE 0.5 MG tablet   Commonly known as:  RISPERDAL  Take 1 tablet (0.5 mg total) by mouth at bedtime.     tamsulosin 0.4 MG Caps capsule  Commonly known as:  FLOMAX  Take 1 capsule (0.4 mg total) by mouth daily.        No orders of the defined types were placed in this encounter.     There is no immunization history on file for this patient.  Social History  Substance Use Topics  . Smoking status: Former Games developermoker  . Smokeless tobacco: Not on file  . Alcohol Use: 0.6 oz/week    1 Cans of beer per week     Comment: occasionallyee    Review of Systems - UTO from pt 2/2 dementia; per nursing per wife pt is weaker on R than he has been and dementia is worsening    Filed Vitals:   09/09/15 2200  BP: 122/55  Pulse: 96  Temp: 98.9 F (37.2 C)  Resp: 22    Physical Exam  GENERAL APPEARANCE: Alert, nonconversant, No acute distress  SKIN: No diaphoresis rash HEENT: Unremarkable RESPIRATORY: Breathing is even, unlabored. Lung sounds are clear   CARDIOVASCULAR: Heart RRR  no murmurs, rubs or gallops. No peripheral edema  GASTROINTESTINAL: Abdomen is soft, non-tender, not distended w/ normal bowel sounds.  GENITOURINARY: Bladder non tender, not distended  MUSCULOSKELETAL: No abnormal joints or musculature NEUROLOGIC: Cranial nerves 2-12 grossly intact; L side weakness PSYCHIATRIC: dementia, no behavioral issues  Patient Active Problem List   Diagnosis Date Noted  . Cerebrovascular accident (CVA) due to stenosis of left middle cerebral artery (HCC) 08/27/2015  . Essential hypertension 08/27/2015  . Seizures (HCC) 08/27/2015  . Type 2 diabetes mellitus with circulatory disorder (HCC) 08/27/2015  . Dementia with behavioral disturbance 08/27/2015  . GERD (gastroesophageal reflux disease) 08/13/2015  . Vascular dementia with behavior disturbance 08/07/2015  . Dyslipidemia associated with type 2 diabetes mellitus (HCC) 08/07/2015  . 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 Subdural hematoma (HCC)  07/30/2015  . Dementia 06/23/2015  . BPH (benign prostatic hypertrophy) 06/23/2015  . CAD (coronary artery disease) s/p stent 06/18/2015  . Diabetes mellitus type 2, controlled (HCC) 06/18/2015  . Hyperlipidemia 06/18/2015  . Diabetes mellitus type 2 with neurological manifestations (HCC) 06/18/2015  . Cerebrovascular accident (CVA) (HCC)   . Intracranial carotid stenosis   . Stroke (HCC) 06/17/2015    CBC    Component Value Date/Time   WBC 6.4 08/04/2015 0525   RBC 3.79* 08/04/2015 0525   HGB 10.9* 08/04/2015 0525   HCT 32.4* 08/04/2015 0525   PLT 167 08/04/2015 0525   MCV 85.5 08/04/2015 0525   LYMPHSABS 1.4 07/30/2015 1030   MONOABS 0.4 07/30/2015 1030   EOSABS 0.6 07/30/2015 1030   BASOSABS 0.0 07/30/2015 1030    CMP     Component Value Date/Time   NA 137 08/04/2015 0525   K 3.8 08/04/2015 0525   CL 101 08/04/2015 0525   CO2 29 08/04/2015 0525   GLUCOSE 149* 08/04/2015 0525   BUN 22* 08/04/2015 0525   CREATININE 1.35* 08/04/2015 0525   CALCIUM 8.5* 08/04/2015 0525   PROT 5.8* 07/30/2015 1021   ALBUMIN 2.7* 07/30/2015 1021   AST 18 07/30/2015 1021   ALT 14* 07/30/2015 1021   ALKPHOS 97 07/30/2015 1021   BILITOT 0.5 07/30/2015 1021   GFRNONAA 46* 08/04/2015 0525   GFRAA 53* 08/04/2015 0525    Assessment and Plan  Subdural hematoma (HCC) Pt seen by neurology who will CT head to follow stability of SDH before restarting ASA for stroke prevention; pt was not a candidate for surgery  Hyperlipidemia LDL - 92, HDL 29; NP lowered dose to 20 mg daily, will see what difference that makes in 6 weeks. Neurology would like tight control, but pt 88 with ongoing dementia, tight control may not be desirable in the big picture  Diabetes mellitus type 2 with neurological manifestations (HCC) A1c 7.0 on glipizide 24 hr  10 mg daily; this is really good for age, to get tight control hypoglycemia is a risk and as dementia continues appetite will decrease. Will leave regimen as  it is     Margit Hanks, MD

## 2015-09-09 NOTE — Assessment & Plan Note (Signed)
A1c 7.0 on glipizide 24 hr  10 mg daily; this is really good for age, to get tight control hypoglycemia is a risk and as dementia continues appetite will decrease. Will leave regimen as it is

## 2015-09-09 NOTE — Assessment & Plan Note (Signed)
Pt seen by neurology who will CT head to follow stability of SDH before restarting ASA for stroke prevention; pt was not a candidate for surgery

## 2015-09-19 ENCOUNTER — Telehealth: Payer: Self-pay | Admitting: Neurology

## 2015-09-19 DIAGNOSIS — I63512 Cerebral infarction due to unspecified occlusion or stenosis of left middle cerebral artery: Secondary | ICD-10-CM

## 2015-09-19 MED ORDER — ASPIRIN EC 81 MG PO TBEC: 81.0000 mg | DELAYED_RELEASE_TABLET | Freq: Every day | ORAL | Status: DC

## 2015-09-19 NOTE — Telephone Encounter (Signed)
Reviewed CT result showed significant improvement of SDH and near resolution. Called pt home number listed on the file. His wife was at home but pt is in NH. Called to NH and talked to pt's nurse regarding starting pt on ASA 81mg  daily. Nurse stated that she needs a physical prescription of ASA 81 mg and the CT result to fax to her. I told her that I will fax over next Monday.  Hi, Katrina, I have put in ASA 81mg  daily order, could you print it out and fax it to his NH along with the recent CT head report? The NH telephone number is [phone].  Also, he has EEG order placed last month but not done yet. Could you please follow up on that? Thanks  Parker PlanJindong Marysol Wellnitz, MD PhD Stroke Neurology 09/19/2015 4:44 PM

## 2015-09-23 MED ORDER — ASPIRIN EC 81 MG PO TBEC: 81.0000 mg | DELAYED_RELEASE_TABLET | Freq: Every day | ORAL | Status: AC

## 2015-09-23 NOTE — Telephone Encounter (Signed)
Rn call [facility] at [phone]. Rn explain she was put on hold twice and no one came to the phone. Rn ask for patients nurse name and fax number. Pts nurse is Herbert SetaHeather and fax is [phone]. Aspirin prescription and Ct scan was fax over and receive via fax.

## 2015-09-23 NOTE — Telephone Encounter (Signed)
Rn call patients nursing home [facility] about patient. Rn call to get nurse name and fax number to fax over the CT scan and aspirin as they have request. Rn than got Renee patients nurse, and than was put on hold. Nurse was on hold for 5 minutes. Nurse had to hang up. Nurse will call back.

## 2015-10-08 ENCOUNTER — Non-Acute Institutional Stay (SKILLED_NURSING_FACILITY): Payer: Medicare Other | Admitting: Adult Health

## 2015-10-08 ENCOUNTER — Other Ambulatory Visit: Payer: Medicare Other

## 2015-10-08 DIAGNOSIS — E785 Hyperlipidemia, unspecified: Secondary | ICD-10-CM | POA: Diagnosis not present

## 2015-10-08 DIAGNOSIS — F0151 Vascular dementia with behavioral disturbance: Secondary | ICD-10-CM | POA: Diagnosis not present

## 2015-10-08 DIAGNOSIS — F01518 Vascular dementia, unspecified severity, with other behavioral disturbance: Secondary | ICD-10-CM

## 2015-10-08 DIAGNOSIS — F29 Unspecified psychosis not due to a substance or known physiological condition: Secondary | ICD-10-CM

## 2015-10-08 DIAGNOSIS — E1149 Type 2 diabetes mellitus with other diabetic neurological complication: Secondary | ICD-10-CM | POA: Diagnosis not present

## 2015-10-08 DIAGNOSIS — I6200 Nontraumatic subdural hemorrhage, unspecified: Secondary | ICD-10-CM

## 2015-10-08 DIAGNOSIS — R569 Unspecified convulsions: Secondary | ICD-10-CM | POA: Diagnosis not present

## 2015-10-08 DIAGNOSIS — E1169 Type 2 diabetes mellitus with other specified complication: Secondary | ICD-10-CM | POA: Diagnosis not present

## 2015-10-08 DIAGNOSIS — S065XAA Traumatic subdural hemorrhage with loss of consciousness status unknown, initial encounter: Secondary | ICD-10-CM

## 2015-10-08 DIAGNOSIS — I63512 Cerebral infarction due to unspecified occlusion or stenosis of left middle cerebral artery: Secondary | ICD-10-CM | POA: Diagnosis not present

## 2015-10-08 DIAGNOSIS — N400 Enlarged prostate without lower urinary tract symptoms: Secondary | ICD-10-CM | POA: Diagnosis not present

## 2015-10-08 DIAGNOSIS — S065X9A Traumatic subdural hemorrhage with loss of consciousness of unspecified duration, initial encounter: Secondary | ICD-10-CM

## 2015-10-17 ENCOUNTER — Encounter: Payer: Self-pay | Admitting: Neurology

## 2015-10-17 ENCOUNTER — Encounter (INDEPENDENT_AMBULATORY_CARE_PROVIDER_SITE_OTHER): Payer: Medicare Other | Admitting: Neurology

## 2015-10-17 ENCOUNTER — Ambulatory Visit (INDEPENDENT_AMBULATORY_CARE_PROVIDER_SITE_OTHER): Payer: Medicare Other | Admitting: Neurology

## 2015-10-17 DIAGNOSIS — S065X9A Traumatic subdural hemorrhage with loss of consciousness of unspecified duration, initial encounter: Secondary | ICD-10-CM

## 2015-10-17 DIAGNOSIS — R404 Transient alteration of awareness: Secondary | ICD-10-CM

## 2015-10-17 DIAGNOSIS — R4182 Altered mental status, unspecified: Secondary | ICD-10-CM

## 2015-10-17 DIAGNOSIS — S065XAA Traumatic subdural hemorrhage with loss of consciousness status unknown, initial encounter: Secondary | ICD-10-CM

## 2015-10-17 DIAGNOSIS — I6200 Nontraumatic subdural hemorrhage, unspecified: Secondary | ICD-10-CM

## 2015-10-17 NOTE — Procedures (Signed)
    History: Parker Hunter is an 82 year old gentleman with an admission to the hospital on 06/17/2015 with gait instability. He was found to have patchy infarcts in the left frontal and parietal area, and old infarcts in the right frontal area. He was readmitted on 07/30/2015 with altered mental status, decreased responsive state. He is being evaluated for this event.  This is a routine EEG. No skull defects are noted. Medications include aspirin, Lipitor, glipizide, Ativan, melatonin, Prilosec, Risperdal, and Flomax.  EEG classification: Dysrhythmia grade 1 generalized  Description of the recording: The background rhythms of this recording consists of a relatively well-modulated medium amplitude rhythm of 7-8 Hz. As the record progresses, the patient appears to remain in the waking state. Photic stimulation and hyperventilation were not performed. Occasionally, there appears to be intermittent generalized 6 Hz slowing. There is no evidence of spike or spike-wave discharges or evidence of persistent focal slowing. EKG monitor shows no evidence of cardiac rhythm abnormalities with a heart rate of 78.  Impression: This is a minimally abnormal EEG recording secondary to mild symmetric background slowing. Such a recording is nonspecific, may be seen with a mild toxic or metabolic encephalopathy, or any dementing type illness. No epileptiform discharges were seen.

## 2015-10-17 NOTE — Progress Notes (Signed)
Please refer to EEG procedure note. 

## 2015-10-20 ENCOUNTER — Encounter: Payer: Self-pay | Admitting: Internal Medicine

## 2015-10-20 ENCOUNTER — Non-Acute Institutional Stay (SKILLED_NURSING_FACILITY): Payer: Medicare Other | Admitting: Internal Medicine

## 2015-10-20 ENCOUNTER — Telehealth: Payer: Self-pay

## 2015-10-20 DIAGNOSIS — J189 Pneumonia, unspecified organism: Secondary | ICD-10-CM | POA: Diagnosis not present

## 2015-10-20 NOTE — Progress Notes (Signed)
MRN: [id number] Name: Parker Hunter  Sex: male Age: 82 y.o. DOB: [birth year]  [facility] #: [facility] Facility/Room:121 Level Of Care: SNF Provider: Merrilee Seashore D Emergency Contacts: Extended Emergency Contact Information Primary Emergency Contact: Fuqua,[name] Address: [address]          [place],  [address] Home Phone: [phone] Relation: None Secondary Emergency Contact: Herbert Seta [place] Mobile Phone: [phone] Relation: None  Code Status:   Allergies: Ibuprofen  Chief Complaint  Patient presents with  . Acute Visit    HPI: Patient is 82 y.o. male with dementia and chronic SDH who nursing has asked me to see at pt's daughters request. History is per daughter and a little from pt.. Pt has had a cough since Friday. Doesn't know if he has had fever.She called th SNF about it. Today she thinks pt looks worse. Pt admits that chest hurts on L side with coughing. Nothing makes it better cough makes it worse.  Past Medical History  Diagnosis Date  . Diabetes mellitus without complication (HCC)   . TIA (transient ischemic attack)   . Neuropathy (HCC)   . Status post dilation of esophageal narrowing   . Hypertension   . Stented coronary artery   . Stroke [facility])     Past Surgical History  Procedure Laterality Date  . Cardiac catheterization    . Hernia repair    . Prostate surgery    . Eye surgery    . Coronary angioplasty    . Esphageal dilation        Medication List       This list is accurate as of: 10/20/15  8:49 PM.  Always use your most recent med list.               aspirin EC 81 MG tablet  Take 1 tablet (81 mg total) by mouth daily.     atorvastatin 40 MG tablet  Commonly known as:  LIPITOR  Take 40 mg by mouth daily.     glipiZIDE 10 MG 24 hr tablet  Commonly known as:  GLUCOTROL XL  Take 10 mg by mouth daily.     LORazepam 0.5 MG tablet  Commonly known as:  ATIVAN  Take 1 tablet (0.5 mg total) by mouth at bedtime as needed  for anxiety.     Melatonin 10 MG Tabs  Take 10 mg by mouth every evening.     omeprazole 20 MG capsule  Commonly known as:  PRILOSEC  Take 20 mg by mouth daily.     risperiDONE 0.5 MG tablet  Commonly known as:  RISPERDAL  Take 1 tablet (0.5 mg total) by mouth at bedtime.     tamsulosin 0.4 MG Caps capsule  Commonly known as:  FLOMAX  Take 1 capsule (0.4 mg total) by mouth daily.        No orders of the defined types were placed in this encounter.     There is no immunization history on file for this patient.  Social History  Substance Use Topics  . Smoking status: Former Games developer  . Smokeless tobacco: Not on file  . Alcohol Use: 0.6 oz/week    1 Cans of beer per week     Comment: occasionallyee    Review of Systems  DATA OBTAINED: from patient, nurse, daughter- as in HPI GENERAL:  no fevers, fatigue, appetite changes SKIN: No itching, rash HEENT: No complaint RESPIRATORY: + cough, no wheezing, no SOB CARDIAC: No chest pain, palpitations,  lower extremity edema  GI: No abdominal pain, No N/V/D or constipation, No heartburn or reflux  GU: No dysuria, frequency or urgency, or incontinence  MUSCULOSKELETAL: No unrelieved bone/joint pain NEUROLOGIC: No headache, dizziness  PSYCHIATRIC: No overt anxiety or sadness  Filed Vitals:   10/20/15 2013  BP: 149/84  Pulse: 70  Temp: 98.7 F (37.1 C)  Resp: 18    Physical Exam  GENERAL APPEARANCE: Alert, moin conversant, looks ill, not toxic SKIN: No diaphoresis rash,pale HEENT: Unremarkable RESPIRATORY: Breathing is even, unlabored. Lung sounds slt rale and wheeze L upper lobe CARDIOVASCULAR: Heart RRR no murmurs, rubs or gallops. No peripheral edema  GASTROINTESTINAL: Abdomen is soft, non-tender, not distended w/ normal bowel sounds.  GENITOURINARY: Bladder non tender, not distended  MUSCULOSKELETAL: No abnormal joints or musculature NEUROLOGIC: Cranial nerves 2-12 grossly intact. Moves all  extremities PSYCHIATRIC: dementia, no behavioral issues  Patient Active Problem List   Diagnosis Date Noted  . HCAP (healthcare-associated pneumonia) 10/20/2015  . Cerebrovascular accident (CVA) due to stenosis of left middle cerebral artery (HCC) 08/27/2015  . Essential hypertension 08/27/2015  . Seizures (HCC) 08/27/2015  . Type 2 diabetes mellitus with circulatory disorder (HCC) 08/27/2015  . Dementia with behavioral disturbance 08/27/2015  . GERD (gastroesophageal reflux disease) 08/13/2015  . Vascular dementia with behavior disturbance 08/07/2015  . Dyslipidemia associated with type 2 diabetes mellitus (HCC) 08/07/2015  . 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 Subdural hematoma (HCC) 07/30/2015  . Dementia 06/23/2015  . BPH (benign prostatic hypertrophy) 06/23/2015  . CAD (coronary artery disease) s/p stent 06/18/2015  . Diabetes mellitus type 2, controlled (HCC) 06/18/2015  . Hyperlipidemia 06/18/2015  . Diabetes mellitus type 2 with neurological manifestations (HCC) 06/18/2015  . Cerebrovascular accident (CVA) (HCC)   . Intracranial carotid stenosis   . Stroke (HCC) 06/17/2015    CBC    Component Value Date/Time   WBC 6.4 08/04/2015 0525   RBC 3.79* 08/04/2015 0525   HGB 10.9* 08/04/2015 0525   HCT 32.4* 08/04/2015 0525   PLT 167 08/04/2015 0525   MCV 85.5 08/04/2015 0525   LYMPHSABS 1.4 07/30/2015 1030   MONOABS 0.4 07/30/2015 1030   EOSABS 0.6 07/30/2015 1030   BASOSABS 0.0 07/30/2015 1030    CMP     Component Value Date/Time   NA 137 08/04/2015 0525   K 3.8 08/04/2015 0525   CL 101 08/04/2015 0525   CO2 29 08/04/2015 0525   GLUCOSE 149* 08/04/2015 0525   BUN 22* 08/04/2015 0525   CREATININE 1.35* 08/04/2015 0525   CALCIUM 8.5* 08/04/2015 0525   PROT 5.8* 07/30/2015 1021   ALBUMIN 2.7* 07/30/2015 1021   AST 18 07/30/2015 1021   ALT 14* 07/30/2015 1021   ALKPHOS 97 07/30/2015 1021   BILITOT 0.5 07/30/2015 1021   GFRNONAA 46* 08/04/2015 0525   GFRAA 53*  08/04/2015 0525    Assessment and Plan  HCAP (healthcare-associated pneumonia) I have ordered labs CXR and urine but clnically pt has PNA. He may have UTI as well as urine has odor. He has no hx of aspiration. CrCl is 42. I have written for levaquin 750 mg q 48 hrs for 10 days ;also alb neb TID for 7 days and guaifenesin 600 mg for 7 days   Time spent > 35 min;> 50% of time with patient was spent reviewing records, labs, tests and studies, counseling and developing plan of care  Margit Hanks, MD

## 2015-10-20 NOTE — Assessment & Plan Note (Signed)
I have ordered labs CXR and urine but clnically pt has PNA. He may have UTI as well as urine has odor. He has no hx of aspiration. CrCl is 42. I have written for levaquin 750 mg q 48 hrs for 10 days ;also alb neb TID for 7 days and guaifenesin 600 mg for 7 days

## 2015-10-20 NOTE — Telephone Encounter (Signed)
-----   Message from Marvel Plan, MD sent at 10/17/2015  6:01 PM EST ----- Could you please let the patient know that the EEG test done recently in our office was negative for seizure activity. Please continue current treatment. Thanks.  Marvel Plan, MD PhD Stroke Neurology 10/17/2015 6:01 PM

## 2015-10-20 NOTE — Telephone Encounter (Signed)
Rn talk to Cory Roughen who is on patients DPR form his wife. Rn explain her husbands EEG was negative for any seizure activity, and to continue treatment plan. Pts wife verbalized understanding.

## 2015-11-05 ENCOUNTER — Non-Acute Institutional Stay (SKILLED_NURSING_FACILITY): Payer: Medicare Other | Admitting: Internal Medicine

## 2015-11-05 ENCOUNTER — Encounter: Payer: Self-pay | Admitting: Internal Medicine

## 2015-11-05 DIAGNOSIS — I10 Essential (primary) hypertension: Secondary | ICD-10-CM

## 2015-11-05 DIAGNOSIS — R569 Unspecified convulsions: Secondary | ICD-10-CM

## 2015-11-05 DIAGNOSIS — K219 Gastro-esophageal reflux disease without esophagitis: Secondary | ICD-10-CM

## 2015-11-05 DIAGNOSIS — E1149 Type 2 diabetes mellitus with other diabetic neurological complication: Secondary | ICD-10-CM | POA: Diagnosis not present

## 2015-11-05 DIAGNOSIS — J189 Pneumonia, unspecified organism: Secondary | ICD-10-CM | POA: Diagnosis not present

## 2015-11-05 DIAGNOSIS — I6200 Nontraumatic subdural hemorrhage, unspecified: Secondary | ICD-10-CM

## 2015-11-05 DIAGNOSIS — I2510 Atherosclerotic heart disease of native coronary artery without angina pectoris: Secondary | ICD-10-CM | POA: Diagnosis not present

## 2015-11-05 DIAGNOSIS — I63512 Cerebral infarction due to unspecified occlusion or stenosis of left middle cerebral artery: Secondary | ICD-10-CM | POA: Diagnosis not present

## 2015-11-05 DIAGNOSIS — S065XAA Traumatic subdural hemorrhage with loss of consciousness status unknown, initial encounter: Secondary | ICD-10-CM

## 2015-11-05 DIAGNOSIS — N400 Enlarged prostate without lower urinary tract symptoms: Secondary | ICD-10-CM | POA: Diagnosis not present

## 2015-11-05 DIAGNOSIS — E785 Hyperlipidemia, unspecified: Secondary | ICD-10-CM | POA: Diagnosis not present

## 2015-11-05 DIAGNOSIS — S065X9A Traumatic subdural hemorrhage with loss of consciousness of unspecified duration, initial encounter: Secondary | ICD-10-CM

## 2015-11-05 DIAGNOSIS — F03918 Unspecified dementia, unspecified severity, with other behavioral disturbance: Secondary | ICD-10-CM

## 2015-11-05 DIAGNOSIS — F29 Unspecified psychosis not due to a substance or known physiological condition: Secondary | ICD-10-CM

## 2015-11-05 DIAGNOSIS — F0391 Unspecified dementia with behavioral disturbance: Secondary | ICD-10-CM | POA: Diagnosis not present

## 2015-11-05 LAB — HM DIABETES FOOT EXAM

## 2015-11-05 NOTE — Progress Notes (Signed)
MRN: [id number] Name: Parker Hunter  Sex: male Age: 83 y.o. DOB: [birth year]  [facility] #: [name] Facility/Room: 321 Level Of Care: SNF Provider: Merrilee Seashore D Emergency Contacts: Extended Emergency Contact Information Primary Emergency Contact: Buchanon,[name] Address: [address]          [place],  [address] Home Phone: [phone] Relation: None Secondary Emergency Contact: Herbert Seta [place] Mobile Phone: [phone] Relation: None  Code Status:   Allergies: Ibuprofen  Chief Complaint  Patient presents with  . New Admit To SNF    HPI: Patient is 82 y.o. male ith DM type II, h/o left frontal CVA (06/2015), HTN, CKD stage 3, CAD s/p stent placement, and dementia. In 07/2015 pt was admitted to [facility] for large left hemisphere hypodensity suggestive of an older/subacute subdural hematoma and left parietal hyperdensity suggestive of a new subdural hematoma sustained while pt was on ASA and Plavix, which were d/c. Pt was admiited to another SNF for care and pt is being transferred to [facility] because it is closer to the family's home. While at SNF pt will be followed for BPH, tx with flomax, HLD, tx with lipior and DM tx with glipizide.  Past Medical History  Diagnosis Date  . Diabetes mellitus without complication (HCC)   . TIA (transient ischemic attack)   . Neuropathy (HCC)   . Status post dilation of esophageal narrowing   . Hypertension   . Stented coronary artery   . Stroke [facility])     Past Surgical History  Procedure Laterality Date  . Cardiac catheterization    . Hernia repair    . Prostate surgery    . Eye surgery    . Coronary angioplasty    . Esphageal dilation        Medication List       This list is accurate as of: 11/05/15 11:59 PM.  Always use your most recent med list.               aspirin EC 81 MG tablet  Take 1 tablet (81 mg total) by mouth daily.     atorvastatin 20 MG tablet  Commonly known as:  LIPITOR  Take 20 mg by mouth  daily.     glipiZIDE 10 MG 24 hr tablet  Commonly known as:  GLUCOTROL XL  Take 10 mg by mouth daily.     LORazepam 0.5 MG tablet  Commonly known as:  ATIVAN  Take 1 tablet (0.5 mg total) by mouth at bedtime as needed for anxiety.     Melatonin 10 MG Tabs  Take 10 mg by mouth every evening.     omeprazole 20 MG capsule  Commonly known as:  PRILOSEC  Take 20 mg by mouth daily.     risperiDONE 0.5 MG tablet  Commonly known as:  RISPERDAL  Take 1 tablet (0.5 mg total) by mouth at bedtime.     tamsulosin 0.4 MG Caps capsule  Commonly known as:  FLOMAX  Take 1 capsule (0.4 mg total) by mouth daily.        Meds ordered this encounter  Medications  . atorvastatin (LIPITOR) 20 MG tablet    Sig: Take 20 mg by mouth daily.     There is no immunization history on file for this patient.  Social History  Substance Use Topics  . Smoking status: Former Games developer  . Smokeless tobacco: Not on file  . Alcohol Use: 0.6 oz/week    1 Cans of beer per  week     Comment: occasionallyee    Family history is +stroke, DM   Review of Systems  DATA OBTAINED: from patient, nurse, wife GENERAL:  no fevers, fatigue, appetite changes SKIN: No itching, rash or wounds EYES: No eye pain, redness, discharge EARS: No earache, tinnitus, change in hearing NOSE: No congestion, drainage or bleeding  MOUTH/THROAT: No mouth or tooth pain, No sore throat RESPIRATORY: No cough, wheezing, SOB CARDIAC: No chest pain, palpitations, lower extremity edema  GI: No abdominal pain, No N/V/D or constipation, No heartburn or reflux  GU: No dysuria, frequency or urgency, or incontinence  MUSCULOSKELETAL: No unrelieved bone/joint pain NEUROLOGIC: No headache, dizziness or focal weakness PSYCHIATRIC: No c/o anxiety or sadness   Filed Vitals:   11/05/15 1119  BP: 128/73  Pulse: 96  Temp: 97.1 F (36.2 C)  Resp: 18    SpO2 Readings from Last 1 Encounters:  11/05/15 98%        Physical  Exam  GENERAL APPEARANCE: Alert,mod conversant,  No acute distress.  SKIN: No diaphoresis rash HEAD: Normocephalic, atraumatic  EYES: Conjunctiva/lids clear. Pupils round, reactive. EOMs intact.  EARS: External exam WNL, canals clear. Hearing grossly normal.  NOSE: No deformity or discharge.  MOUTH/THROAT: Lips w/o lesions  RESPIRATORY: Breathing is even, unlabored. Lung sounds are clear   CARDIOVASCULAR: Heart RRR no murmurs, rubs or gallops. No peripheral edema.   GASTROINTESTINAL: Abdomen is soft, non-tender, not distended w/ normal bowel sounds. GENITOURINARY: Bladder non tender, not distended  MUSCULOSKELETAL: No abnormal joints or musculature NEUROLOGIC:  Cranial nerves 2-12 grossly intact; L side weakness PSYCHIATRIC: Mood and affect appropriate to situation with dementia, no behavioral issues  Patient Active Problem List   Diagnosis Date Noted  . HCAP (healthcare-associated pneumonia) 10/20/2015  . Cerebrovascular accident (CVA) due to stenosis of left middle cerebral artery (HCC) 08/27/2015  . Essential hypertension 08/27/2015  . Seizures (HCC) 08/27/2015  . Type 2 diabetes mellitus with circulatory disorder (HCC) 08/27/2015  . Dementia with behavioral disturbance 08/27/2015  . GERD (gastroesophageal reflux disease) 08/13/2015  . Vascular dementia with behavior disturbance 08/07/2015  . Dyslipidemia associated with type 2 diabetes mellitus (HCC) 08/07/2015  . 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 Subdural hematoma (HCC) 07/30/2015  . Dementia 06/23/2015  . BPH (benign prostatic hypertrophy) 06/23/2015  . CAD (coronary artery disease) s/p stent 06/18/2015  . Diabetes mellitus type 2, controlled (HCC) 06/18/2015  . Hyperlipidemia 06/18/2015  . Diabetes mellitus type 2 with neurological manifestations (HCC) 06/18/2015  . Cerebrovascular accident (CVA) (HCC)   . Intracranial carotid stenosis   . Stroke (HCC) 06/17/2015    CBC    Component Value Date/Time   WBC 6.4  08/04/2015 0525   RBC 3.79* 08/04/2015 0525   HGB 10.9* 08/04/2015 0525   HCT 32.4* 08/04/2015 0525   PLT 167 08/04/2015 0525   MCV 85.5 08/04/2015 0525   LYMPHSABS 1.4 07/30/2015 1030   MONOABS 0.4 07/30/2015 1030   EOSABS 0.6 07/30/2015 1030   BASOSABS 0.0 07/30/2015 1030    CMP     Component Value Date/Time   NA 137 08/04/2015 0525   K 3.8 08/04/2015 0525   CL 101 08/04/2015 0525   CO2 29 08/04/2015 0525   GLUCOSE 149* 08/04/2015 0525   BUN 22* 08/04/2015 0525   CREATININE 1.35* 08/04/2015 0525   CALCIUM 8.5* 08/04/2015 0525   PROT 5.8* 07/30/2015 1021   ALBUMIN 2.7* 07/30/2015 1021   AST 18 07/30/2015 1021   ALT 14* 07/30/2015 1021  ALKPHOS 97 07/30/2015 1021   BILITOT 0.5 07/30/2015 1021   GFRNONAA 46* 08/04/2015 0525   GFRAA 53* 08/04/2015 0525    Lab Results  Component Value Date   HGBA1C 7.0* 06/18/2015     Ct Head Wo Contrast  09/10/2015   [facility] [address] [place], [place] [address] [phone] NEUROIMAGING REPORT STUDY DATE: 09/09/2015 PATIENT NAME: Quy Lotts DOB: [birth year] MRN: [id number] EXAM: CT of the head without contrast ORDERING CLINICIAN: Marvel Plan, MD, PhD CLINICAL HISTORY: 82 year old man with recent subdural hematoma COMPARISON FILMS: CT scan 08/03/2015 TECHNIQUE: CT scan of the head was obtained utilizing 5 mm axial slices from the skull base to the vertex. CONTRAST: None IMAGING SITE: [employer],  [address] FINDINGS: There is a small left convexity chronic subdural hematoma with maximum diameter of 4.8 mm (compared to 20.9 mm 08/03/2015).   The fourth ventricle is mildly enlarged, in proportion to the extent of cerebellar and brainstem atrophy. The third and lateral ventricles are moderately enlarged, and proportion to the extent of moderate generalized cortical atrophy. Additionally, the right lateral ventricle is further enlarged due to encephalomalacia in the right frontal lobe and adjacent  basal ganglia from a prior stroke.   There are chronic hypodense changes within the cerebellum, and bilaterally in both hemispheres consistent with chronic microvascular ischemic changes.   There is no evidence of mass effect or midline shift.  The orbits and their contents, paranasal sinuses and calvarium are unremarkable.  Compared to the CT scan dated 08/03/2015, there has been near complete resolution of the subdural hematoma.  The small left tentorial subdural hematoma has completely resolved. The chronic ischemic changes are essentially unchanged.   09/10/2015   This CT scan of the head shows the following: 1.    Small left convexity chronic subdural hematoma that is vastly improved when compared to the CT scan dated 08/03/2015. 2.    Left frontal and basal ganglia chronic infarction with encephalomalacia and compensatory ventricular enlargement, unchanged since the prior exam. 3.    Chronic microvascular ischemic changes, unchanged when compared to 08/03/2015. 4.   There are no acute findings. INTERPRETING PHYSICIAN: Lewin A. Epimenio Foot, MD, PhD Certified in  Neuroimaging by American Society of Neuroimaging    Not all labs, radiology exams or other studies done during hospitalization come through on my EPIC note; however they are reviewed by me.    Assessment and Plan  Cerebrovascular accident (CVA) due to stenosis of left middle cerebral artery (HCC) Pt is on statin. Pt is on ASA 81 mg as prophylaxos, not more 2/2 subdural hematomas.  Subdural hematoma (HCC) Found on hospitalization in 07/2015 while pt was on ASA and plavix; Pt has been restarted on ASA 81 mg but not plavix 2/2 sundural hematoma X2  Dementia with behavioral disturbance Pt has done very well with current medications of  risperdol amd ativan at bedtime; plan - cont current meds  Seizures (HCC) Pt had been on keppra sinc e SDH dx, had recent visit to Neuro with EEG done which showed no seizure activity so keppra was  stopped.  Nonorganic psychosis Chronic and stable and cont risperdal qHS  Hyperlipidemia Lipitor was lowered to 20 mg several months ago ; will obtain FLP  Diabetes mellitus type 2 with neurological manifestations (HCC) A1c is 7.o on glipizide 10 mg; this is good for age, no changes;pt is on statn  Essential hypertension Pt's BP is controlled on no meds ; will cont to  monitor  CAD (coronary artery disease) s/p stent Pt is on ASA only and statin;not on plavix 2/2 subdural hematomas  GERD (gastroesophageal reflux disease) No signs or sx of prilosec 20 mg daily;cont current regimen  BPH (benign prostatic hypertrophy) No recent UTI's ;cont flomax 0.4 mg daily  HCAP (healthcare-associated pneumonia) On 2/6 pt was dx with PNA, treated in SNF with resolution   Time spent > 45 min;> 50% of time with patient was spent reviewing records, labs, tests and studies, counseling and developing plan of care  Margit Hanks, MD

## 2015-11-08 ENCOUNTER — Encounter: Payer: Self-pay | Admitting: Internal Medicine

## 2015-11-08 NOTE — Assessment & Plan Note (Signed)
Chronic and stable and cont risperdal qHS

## 2015-11-08 NOTE — Assessment & Plan Note (Signed)
Pt is on ASA only and statin;not on plavix 2/2 subdural hematomas

## 2015-11-08 NOTE — Assessment & Plan Note (Signed)
Pt is on statin. Pt is on ASA 81 mg as prophylaxos, not more 2/2 subdural hematomas.

## 2015-11-08 NOTE — Assessment & Plan Note (Signed)
A1c is 7.o on glipizide 10 mg; this is good for age, no changes;pt is on statn

## 2015-11-08 NOTE — Assessment & Plan Note (Signed)
On 2/6 pt was dx with PNA, treated in SNF with resolution

## 2015-11-08 NOTE — Assessment & Plan Note (Signed)
Lipitor was lowered to 20 mg several months ago ; will obtain FLP

## 2015-11-08 NOTE — Assessment & Plan Note (Signed)
Pt has done very well with current medications of  risperdol amd ativan at bedtime; plan - cont current meds

## 2015-11-08 NOTE — Assessment & Plan Note (Signed)
Found on hospitalization in 07/2015 while pt was on ASA and plavix; Pt has been restarted on ASA 81 mg but not plavix 2/2 sundural hematoma X2

## 2015-11-08 NOTE — Assessment & Plan Note (Signed)
No signs or sx of prilosec 20 mg daily;cont current regimen

## 2015-11-08 NOTE — Assessment & Plan Note (Signed)
Pt's BP is controlled on no meds ; will cont to monitor

## 2015-11-08 NOTE — Assessment & Plan Note (Addendum)
Pt had been on keppra sinc e SDH dx, had recent visit to Neuro with EEG done which showed no seizure activity so keppra was stopped.

## 2015-11-08 NOTE — Assessment & Plan Note (Signed)
No recent UTI's ;cont flomax 0.4 mg daily

## 2015-12-01 ENCOUNTER — Telehealth: Payer: Self-pay | Admitting: *Deleted

## 2015-12-01 NOTE — Progress Notes (Signed)
Patient ID: Parker Hunter, male   DOB: 1927/06/10, 82 y.o.   MRN: [id number]   Facility:  [facility]       Allergies  Allergen Reactions  . Ibuprofen Other (See Comments)    GI Bleeding    Chief Complaint  Patient presents with  . Medical Management of Chronic Issues    HPI:  He is a long term resident of this facility being seen for the management of his chronic illnesses. Overall his status is stable. He is unable to fully participate in the hpi or ros. There are no nursing concerns at this time.    Past Medical History  Diagnosis Date  . Diabetes mellitus without complication (HCC)   . TIA (transient ischemic attack)   . Neuropathy (HCC)   . Status post dilation of esophageal narrowing   . Hypertension   . Stented coronary artery   . Stroke [facility](HCC)     Past Surgical History  Procedure Laterality Date  . Cardiac catheterization    . Hernia repair    . Prostate surgery    . Eye surgery    . Coronary angioplasty    . Esphageal dilation      VITAL SIGNS BP 125/68 mmHg  Pulse 76  Temp(Src) 97.7 F (36.5 C)  Ht 5\' 6"  (1.676 m)  Wt 150 lb (68.04 kg)  BMI 24.22 kg/m2  SpO2 97%  Patient's Medications  New Prescriptions   No medications on file  Previous Medications   ASPIRIN EC 81 MG TABLET    Take 1 tablet (81 mg total) by mouth daily.   ATORVASTATIN (LIPITOR) 20 MG TABLET    Take 20 mg by mouth daily.   DIPHENHYDRAMINE-APAP, SLEEP, (TYLENOL PM EXTRA STRENGTH PO)    Take 1 tablet by mouth at bedtime.   GLIPIZIDE (GLUCOTROL XL) 10 MG 24 HR TABLET    Take 10 mg by mouth daily.   LORAZEPAM (ATIVAN) 0.5 MG TABLET    Take 1 tablet (0.5 mg total) by mouth at bedtime as needed for anxiety.   MELATONIN 10 MG TABS    Take 10 mg by mouth every evening.   OMEPRAZOLE (PRILOSEC) 20 MG CAPSULE    Take 20 mg by mouth daily.   RISPERIDONE (RISPERDAL) 0.5 MG TABLET    Take 1 tablet (0.5 mg total) by mouth at bedtime.   SERTRALINE (ZOLOFT) 25 MG TABLET    Take 25 mg by mouth  daily.   TAMSULOSIN (FLOMAX) 0.4 MG CAPS CAPSULE    Take 1 capsule (0.4 mg total) by mouth daily.  Modified Medications   No medications on file  Discontinued Medications   No medications on file     SIGNIFICANT DIAGNOSTIC EXAMS   06-18-15: 2-d echo: Technically difficult study. LVEF 55-60%, grossly normal wall motion, mild LVH, diastolic dysfunction with normal LV filling pressure, mild MR, top normal LA size.  07-30-15: ct of head: 1. Acute LEFT extra-axial hemorrhage (likely subdural) superimposed on a subacute LEFT subdural hematoma. Both findings are new from MRI of 06/18/2015. 2. Rightward midline shift of 9 mm. Mild compression of the LEFT lateral ventricle. 3. Small amount of subdural blood along the LEFT tentorium. 4. Extensive atrophy and white matter microvascular disease.  08-03-15: ct of head: Mixed density subdural hematoma on the left. The low-density component appears to have enlarged in the interval. The high-density component is stable. There is a small left tentorial subdural hematoma. 6.5 mm midline shift to the right is unchanged. Atrophy and chronic  ischemia especially on the right.     LABS REVIEWED:   06-18-15: hgb a1c 7.0; chol 145; ldl 92; trig 121; hdl 29 07-30-15: wbc 5.5; hgb 10.1; hct 30.2; mcv 85.6; plt 140; glucose 130; bun 16; creat 1.27; k+ 3.8; na++140; liver normal albumin 2.7 07-31-15: wbc 5.4; hgb 10.8; hct 32.3; mcv 86.8; plt 150; glucose 92; bun 13; creat 1.19; k+ 3.9; na++140 08-01-15: wbc 6.6; hgb 11.3; hct 32.7; mcv 85.2; plt 144; glucose 140; bun 17; creat 1.24; k+ 3.7; na++137 08-04-15: wbc 6.4; hgb 10.9; hct 32.4; mcv 85.5; plt 167; glucose 149; bun 22; creat 1.35; k+ 3.8; na++137     Review of Systems  Unable to perform ROS: dementia     Physical Exam  Constitutional: No distress.  Thin   Eyes: Conjunctivae are normal.  Neck: Neck supple. No JVD present. No thyromegaly present.  Cardiovascular: Normal rate, regular rhythm and  intact distal pulses.   Respiratory: Effort normal and breath sounds normal. No respiratory distress. He has no wheezes.  GI: Soft. Bowel sounds are normal. He exhibits no distension. There is no tenderness.  Musculoskeletal: He exhibits no edema.  Able to move all extremities  Has left sided weakness present   Lymphadenopathy:    He has no cervical adenopathy.  Neurological: alert   Skin: Skin is warm and dry. He is not diaphoretic.  Psychiatric: He has a normal mood and affect.     ASSESSMENT/ PLAN:  1. Diabetes: his hgb a1c is 7.0; will continue glipizide er 10 mg daily   2. Dyslipidemia: ldl is 92; will lower lipitor to 20 mg daily   3. Acute on chronic subdural hematoma:  Is neurologically stable will continue asa 81 mg daily   4. CVA: without neurological change. Will continue asa 81 mg daily   5. GERD; will continue prilosec 20 mg daily   6. BPH: will continue flomax daily   7. Vascular dementia: is advanced stage. Is presently not on medications will monitor his current weight is 150 pounds   8. Psychosis:  Will continue risperdal 0.5 mg nightly will continue melatonin 10 mg nightly for sleep and has ativan 0.5 mg nightly for anxiety   9. CAD status post stent: no signs of chest pain present; will monitor            Synthia Innocent NP [facility]  Contact [phone] Monday through Friday 8am- 5pm  After hours call [phone]

## 2015-12-01 NOTE — Telephone Encounter (Signed)
Rn call patients wife [place] back who is on DPR form. Rn stated Dr. Roda ShuttersXu look at patients chart and stated there will be no test order for patient. Rn stated per Dr. Roda ShuttersXu if patient is stable and doing well he can schedule appt if needed. Pts wife she move her husband to a different facility and is paying most of the cost, and things are tight. Rn stated there are PA and NP at the facility that can evaluate patient. Pts wife stated verbalized understanding,and will call if there are any issues.

## 2015-12-03 ENCOUNTER — Ambulatory Visit: Payer: Medicare Other | Admitting: Neurology

## 2015-12-04 LAB — HM DIABETES FOOT EXAM

## 2016-01-02 ENCOUNTER — Non-Acute Institutional Stay (SKILLED_NURSING_FACILITY): Payer: Medicare Other | Admitting: Internal Medicine

## 2016-01-02 ENCOUNTER — Encounter: Payer: Self-pay | Admitting: Internal Medicine

## 2016-01-02 DIAGNOSIS — F329 Major depressive disorder, single episode, unspecified: Secondary | ICD-10-CM

## 2016-01-02 DIAGNOSIS — I2510 Atherosclerotic heart disease of native coronary artery without angina pectoris: Secondary | ICD-10-CM

## 2016-01-02 DIAGNOSIS — F29 Unspecified psychosis not due to a substance or known physiological condition: Secondary | ICD-10-CM | POA: Diagnosis not present

## 2016-01-02 DIAGNOSIS — F32A Depression, unspecified: Secondary | ICD-10-CM

## 2016-01-02 NOTE — Progress Notes (Signed)
Patient ID: Parker Hunter, male   DOB: 01-23-1927, 82 y.o.   MRN: [id number] MRN: [id number] Name: Parker Hunter  Sex: male Age: 82 y.o. DOB: 08-10-1927  PSC #: [facility] Facility/Room: 321 P Level Of Care: SNF Provider: Margit Hanks Emergency Contacts: Extended Emergency Contact Information Primary Emergency Contact: Klemz,[name] Address: [address]          [place],  [address] Home Phone: [phone] Relation: None Secondary Emergency Contact: Herbert Seta [place] Mobile Phone: [phone] Relation: None  Code Status:   Allergies: Ibuprofen  Chief Complaint  Patient presents with  . Medical Management of Chronic Issues    Routine Visit    HPI: Patient is 82 y.o. male who is being seen for routine issues of CAD, psychosis and depression.  Past Medical History  Diagnosis Date  . Diabetes mellitus without complication (HCC)   . TIA (transient ischemic attack)   . Neuropathy (HCC)   . Status post dilation of esophageal narrowing   . Hypertension   . Stented coronary artery   . Stroke [facility])     Past Surgical History  Procedure Laterality Date  . Cardiac catheterization    . Hernia repair    . Prostate surgery    . Eye surgery    . Coronary angioplasty    . Esphageal dilation        Medication List       This list is accurate as of: 01/02/16 11:59 PM.  Always use your most recent med list.               AMBULATORY NON FORMULARY MEDICATION  MedPass: Give 4 oz by mouth twice daily between meals.     aspirin EC 81 MG tablet  Take 1 tablet (81 mg total) by mouth daily.     atorvastatin 20 MG tablet  Commonly known as:  LIPITOR  Take 20 mg by mouth daily.     glipiZIDE 10 MG 24 hr tablet  Commonly known as:  GLUCOTROL XL  Take 10 mg by mouth daily.     LORazepam 0.5 MG tablet  Commonly known as:  ATIVAN  Take 1 tablet (0.5 mg total) by mouth at bedtime as needed for anxiety.     Melatonin 10 MG Tabs  Take 10 mg by mouth every  evening.     omeprazole 20 MG capsule  Commonly known as:  PRILOSEC  Take 20 mg by mouth daily.     risperiDONE 0.5 MG tablet  Commonly known as:  RISPERDAL  Take 1 tablet (0.5 mg total) by mouth at bedtime.     sertraline 25 MG tablet  Commonly known as:  ZOLOFT  Take 25 mg by mouth daily.     tamsulosin 0.4 MG Caps capsule  Commonly known as:  FLOMAX  Take 1 capsule (0.4 mg total) by mouth daily.     TYLENOL PM EXTRA STRENGTH PO  Take 1 tablet by mouth at bedtime.        Meds ordered this encounter  Medications  . AMBULATORY NON FORMULARY MEDICATION    Sig: MedPass: Give 4 oz by mouth twice daily between meals.    Immunization History  Administered Date(s) Administered  . PPD Test 11/05/2015    Social History  Substance Use Topics  . Smoking status: Former Games developer  . Smokeless tobacco: Not on file  . Alcohol Use: 0.6 oz/week    1 Cans of beer per week     Comment:  occasionallyee    Review of Systems  UTO 2/2 dementia    Filed Vitals:   01/02/16 1432  BP: 184/92  Pulse: 92  Temp: 98.3 F (36.8 C)  Resp: 18    Physical Exam  GENERAL APPEARANCE: Alert, min conversant, No acute distress  SKIN: No diaphoresis rash HEENT: Unremarkable RESPIRATORY: Breathing is even, unlabored. Lung sounds are clear   CARDIOVASCULAR: Heart RRR no murmurs, rubs or gallops. No peripheral edema  GASTROINTESTINAL: Abdomen is soft, non-tender, not distended w/ normal bowel sounds.  GENITOURINARY: Bladder non tender, not distended  MUSCULOSKELETAL: No abnormal joints or musculature NEUROLOGIC: Cranial nerves 2-12 grossly intact; L side weakness PSYCHIATRIC: dementia, no behavioral issues  Patient Active Problem List   Diagnosis Date Noted  . Depression 01/04/2016  . HCAP (healthcare-associated pneumonia) 10/20/2015  . Cerebrovascular accident (CVA) due to stenosis of left middle cerebral artery (HCC) 08/27/2015  . Essential hypertension 08/27/2015  . Seizures (HCC)  08/27/2015  . Type 2 diabetes mellitus with circulatory disorder (HCC) 08/27/2015  . Dementia with behavioral disturbance 08/27/2015  . GERD (gastroesophageal reflux disease) 08/13/2015  . Vascular dementia with behavior disturbance 08/07/2015  . Dyslipidemia associated with type 2 diabetes mellitus (HCC) 08/07/2015  . 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 Subdural hematoma (HCC) 07/30/2015  . Dementia 06/23/2015  . BPH (benign prostatic hypertrophy) 06/23/2015  . CAD (coronary artery disease) s/p stent 06/18/2015  . Diabetes mellitus type 2, controlled (HCC) 06/18/2015  . Hyperlipidemia 06/18/2015  . Diabetes mellitus type 2 with neurological manifestations (HCC) 06/18/2015  . Cerebrovascular accident (CVA) (HCC)   . Intracranial carotid stenosis   . Stroke (HCC) 06/17/2015    CBC    Component Value Date/Time   WBC 6.4 08/04/2015 0525   RBC 3.79* 08/04/2015 0525   HGB 10.9* 08/04/2015 0525   HCT 32.4* 08/04/2015 0525   PLT 167 08/04/2015 0525   MCV 85.5 08/04/2015 0525   LYMPHSABS 1.4 07/30/2015 1030   MONOABS 0.4 07/30/2015 1030   EOSABS 0.6 07/30/2015 1030   BASOSABS 0.0 07/30/2015 1030    CMP     Component Value Date/Time   NA 137 08/04/2015 0525   K 3.8 08/04/2015 0525   CL 101 08/04/2015 0525   CO2 29 08/04/2015 0525   GLUCOSE 149* 08/04/2015 0525   BUN 22* 08/04/2015 0525   CREATININE 1.35* 08/04/2015 0525   CALCIUM 8.5* 08/04/2015 0525   PROT 5.8* 07/30/2015 1021   ALBUMIN 2.7* 07/30/2015 1021   AST 18 07/30/2015 1021   ALT 14* 07/30/2015 1021   ALKPHOS 97 07/30/2015 1021   BILITOT 0.5 07/30/2015 1021   GFRNONAA 46* 08/04/2015 0525   GFRAA 53* 08/04/2015 0525    Assessment and Plan  CAD (coronary artery disease) s/p stent Stable without known c/o CP ;plan - cont ASA and lipitor;odd plavix 2/2 SDH  Nonorganic psychosis Cont stabl;e on risperdal q HS  Depression Pt has been started on zoloft 25 mg daily;pt has been more cooperative since starting  zoloft    Merrilee SeashoreAnne Cyrstal Leitz, MD

## 2016-01-04 ENCOUNTER — Encounter: Payer: Self-pay | Admitting: Internal Medicine

## 2016-01-04 DIAGNOSIS — F329 Major depressive disorder, single episode, unspecified: Secondary | ICD-10-CM | POA: Insufficient documentation

## 2016-01-04 DIAGNOSIS — F32A Depression, unspecified: Secondary | ICD-10-CM | POA: Insufficient documentation

## 2016-01-04 NOTE — Assessment & Plan Note (Signed)
Pt has been started on zoloft 25 mg daily;pt has been more cooperative since starting zoloft

## 2016-01-04 NOTE — Assessment & Plan Note (Signed)
Stable without known c/o CP ;plan - cont ASA and lipitor;odd plavix 2/2 SDH

## 2016-01-04 NOTE — Assessment & Plan Note (Signed)
Cont stabl;e on risperdal q HS

## 2016-01-15 LAB — LIPID PANEL
Cholesterol: 116 mg/dL (ref 0–200)
HDL: 31 mg/dL — AB (ref 35–70)
LDL CALC: 64 mg/dL
Triglycerides: 107 mg/dL (ref 40–160)

## 2016-01-15 LAB — HEMOGLOBIN A1C: Hemoglobin A1C: 9.5

## 2016-01-30 ENCOUNTER — Non-Acute Institutional Stay (SKILLED_NURSING_FACILITY): Payer: Medicare Other | Admitting: Internal Medicine

## 2016-01-30 ENCOUNTER — Encounter: Payer: Self-pay | Admitting: Internal Medicine

## 2016-01-30 DIAGNOSIS — F01518 Vascular dementia, unspecified severity, with other behavioral disturbance: Secondary | ICD-10-CM

## 2016-01-30 DIAGNOSIS — F0151 Vascular dementia with behavioral disturbance: Secondary | ICD-10-CM | POA: Diagnosis not present

## 2016-01-30 DIAGNOSIS — R569 Unspecified convulsions: Secondary | ICD-10-CM | POA: Diagnosis not present

## 2016-01-30 DIAGNOSIS — I6529 Occlusion and stenosis of unspecified carotid artery: Secondary | ICD-10-CM | POA: Diagnosis not present

## 2016-01-30 NOTE — Progress Notes (Signed)
MRN: [id number] Name: Parker Hunter  Sex: male Age: 82 y.o. DOB: 08-20-1927  [facility] P Level Of Care: SNF Provider: Randon GoldsmithAnne D. Alexander Emergency Contacts: Extended Emergency Contact Information Primary Emergency Contact: Schimming,[name] Address: [address]          [place],  [id number] Home Phone: [phone] Relation: None Secondary Emergency Contact: Herbert SetaYard,Robin  [place] Mobile Phone: [phone] Relation: None  Code Status: DNR  Allergies: Ibuprofen  Chief Complaint  Patient presents with  . Medical Management of Chronic Issues    Routine Visit    HPI: Patient is 82 y.o. male who is being seen for routine issues of intracranial carotid stenosis,  Vascular dementia and seizures.  Past Medical History  Diagnosis Date  . Diabetes mellitus without complication (HCC)   . TIA (transient ischemic attack)   . Neuropathy (HCC)   . Status post dilation of esophageal narrowing   . Hypertension   . Stented coronary artery   . Stroke [facility](HCC)     Past Surgical History  Procedure Laterality Date  . Cardiac catheterization    . Hernia repair    . Prostate surgery    . Eye surgery    . Coronary angioplasty    . Esphageal dilation        Medication List       This list is accurate as of: 01/30/16 11:59 PM.  Always use your most recent med list.               aspirin EC 81 MG tablet  Take 1 tablet (81 mg total) by mouth daily.     atorvastatin 20 MG tablet  Commonly known as:  LIPITOR  Take 20 mg by mouth at bedtime.     glipiZIDE 10 MG 24 hr tablet  Commonly known as:  GLUCOTROL XL  Take 10 mg by mouth daily.     LORazepam 0.5 MG tablet  Commonly known as:  ATIVAN  Take 1 tablet (0.5 mg total) by mouth at bedtime as needed for anxiety.     Melatonin 10 MG Tabs  Take 10 mg by mouth at bedtime.     omeprazole 20 MG capsule  Commonly known as:  PRILOSEC  Take 20 mg by mouth daily.     risperiDONE 0.25 MG tablet   Commonly known as:  RISPERDAL  Take 0.25 mg by mouth at bedtime.     sertraline 25 MG tablet  Commonly known as:  ZOLOFT  daily. Give 25 mg tablet  combined with 50 mg tablet  = 75 mg to be given daily by mouth     sertraline 50 MG tablet  Commonly known as:  ZOLOFT  Give 50 mg tablet combined with 25 mg tablet to = 75 mg to be given by mouth daily     tamsulosin 0.4 MG Caps capsule  Commonly known as:  FLOMAX  Take 1 capsule (0.4 mg total) by mouth daily.     TYLENOL PM EXTRA STRENGTH PO  Take 1 tablet by mouth at bedtime.     UNABLE TO FIND  Med Name: Magic Cup give wit lunch and dinner to support weight status        No orders of the defined types were placed in this encounter.    Immunization History  Administered Date(s) Administered  . PPD Test 11/05/2015, 11/20/2015    Social History  Substance Use Topics  . Smoking status: Former Games developermoker  . Smokeless  tobacco: Not on file  . Alcohol Use: 0.6 oz/week    1 Cans of beer per week     Comment: occasionallyee    Review of Systems  UTO 2/2 dementia    Filed Vitals:   01/30/16 1645  BP: 172/87  Pulse: 85  Temp: 98.2 F (36.8 C)  Resp: 18    Physical Exam  GENERAL APPEARANCE: Alert,  No acute distress  SKIN: No diaphoresis rash HEENT: Unremarkable RESPIRATORY: Breathing is even, unlabored. Lung sounds are clear   CARDIOVASCULAR: Heart RRR no murmurs, rubs or gallops. No peripheral edema  GASTROINTESTINAL: Abdomen is soft, non-tender, not distended w/ normal bowel sounds.  GENITOURINARY: Bladder non tender, not distended  MUSCULOSKELETAL: No abnormal joints or musculature NEUROLOGIC: Cranial nerves 2-12 grossly intact; :L side weakness PSYCHIATRIC: dementia, no behavioral issues  Patient Active Problem List   Diagnosis Date Noted  . Depression 01/04/2016  . HCAP (healthcare-associated pneumonia) 10/20/2015  . Cerebrovascular accident (CVA) due to stenosis of left middle cerebral artery (HCC)  08/27/2015  . Essential hypertension 08/27/2015  . Seizures (HCC) 08/27/2015  . Type 2 diabetes mellitus with circulatory disorder (HCC) 08/27/2015  . Dementia with behavioral disturbance 08/27/2015  . GERD (gastroesophageal reflux disease) 08/13/2015  . Vascular dementia with behavior disturbance 08/07/2015  . Dyslipidemia associated with type 2 diabetes mellitus (HCC) 08/07/2015  . 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 Subdural hematoma (HCC) 07/30/2015  . Dementia 06/23/2015  . BPH (benign prostatic hypertrophy) 06/23/2015  . CAD (coronary artery disease) s/p stent 06/18/2015  . Diabetes mellitus type 2, controlled (HCC) 06/18/2015  . Hyperlipidemia 06/18/2015  . Diabetes mellitus type 2 with neurological manifestations (HCC) 06/18/2015  . Cerebrovascular accident (CVA) (HCC)   . Intracranial carotid stenosis   . Stroke (HCC) 06/17/2015    CBC    Component Value Date/Time   WBC 6.4 08/04/2015 0525   RBC 3.79* 08/04/2015 0525   HGB 10.9* 08/04/2015 0525   HCT 32.4* 08/04/2015 0525   PLT 167 08/04/2015 0525   MCV 85.5 08/04/2015 0525   LYMPHSABS 1.4 07/30/2015 1030   MONOABS 0.4 07/30/2015 1030   EOSABS 0.6 07/30/2015 1030   BASOSABS 0.0 07/30/2015 1030    CMP     Component Value Date/Time   NA 137 08/04/2015 0525   K 3.8 08/04/2015 0525   CL 101 08/04/2015 0525   CO2 29 08/04/2015 0525   GLUCOSE 149* 08/04/2015 0525   BUN 22* 08/04/2015 0525   CREATININE 1.35* 08/04/2015 0525   CALCIUM 8.5* 08/04/2015 0525   PROT 5.8* 07/30/2015 1021   ALBUMIN 2.7* 07/30/2015 1021   AST 18 07/30/2015 1021   ALT 14* 07/30/2015 1021   ALKPHOS 97 07/30/2015 1021   BILITOT 0.5 07/30/2015 1021   GFRNONAA 46* 08/04/2015 0525   GFRAA 53* 08/04/2015 0525    Assessment and Plan  Intracranial carotid stenosis Not a candidate for surgery; Cont ASA and statin  Vascular dementia with behavior disturbance Chronic and stale without major declines; cont monitor  Seizures  (HCC) Continues without reported seizure without Keppra; will monitor    Anne D. Lyn Hollingshead, MD

## 2016-01-31 ENCOUNTER — Encounter: Payer: Self-pay | Admitting: Internal Medicine

## 2016-01-31 NOTE — Assessment & Plan Note (Signed)
Chronic and stale without major declines; cont monitor

## 2016-01-31 NOTE — Assessment & Plan Note (Signed)
Continues without reported seizure without Keppra; will monitor

## 2016-01-31 NOTE — Assessment & Plan Note (Signed)
Not a candidate for surgery; Cont ASA and statin

## 2016-02-16 ENCOUNTER — Non-Acute Institutional Stay (SKILLED_NURSING_FACILITY): Payer: Medicare Other | Admitting: Internal Medicine

## 2016-02-16 DIAGNOSIS — R05 Cough: Secondary | ICD-10-CM

## 2016-02-16 DIAGNOSIS — R059 Cough, unspecified: Secondary | ICD-10-CM

## 2016-02-16 DIAGNOSIS — Z8709 Personal history of other diseases of the respiratory system: Secondary | ICD-10-CM | POA: Diagnosis not present

## 2016-02-16 NOTE — Progress Notes (Signed)
Patient ID: Parker Hunter, male   DOB: 11/20/1926, 82 y.o.   MRN: [id number] MRN: [id number] Name: Parker Hunter  Sex: male Age: 82 y.o. DOB: [birth year]  [facility] Facility/Room: 321 P Level Of Care: SNF Provider: [name]. Hunter Emergency Contacts: Extended Emergency Contact Information Primary Emergency Contact: Hunter,Parker Address: [address]          [place],  [address] Home Phone: [phone] Relation: None Secondary Emergency Contact: Parker Hunter [place] Mobile Phone: [phone] Relation: None  Code Status: DNR  Allergies: Ibuprofen  Chief Complaint  Patient presents with  . Acute Visit  Secondary to concerns over aspiration  HPI: Patient is 82 y.o. male who is being seen for concerns of aspiration.  Apparently patient had a significant coughing episode during dinner this evening-I did witness this and he did cough up a fairly significant amount of phlegm some which appeared to have food in it.  .  After a fairly extended course of productive coughing he appears to be back at his baseline vital signs are stable 2 saturation is 90% on room air he is not showing any distress.  Patient does have a history of significant dementia apparently is doing well with supportive care but there is concern with aspiration with what we saw tonight--He does have a history of pneumonia    Past Medical History  Diagnosis Date  . Diabetes mellitus without complication (HCC)   . TIA (transient ischemic attack)   . Neuropathy (HCC)   . Status post dilation of esophageal narrowing   . Hypertension   . Stented coronary artery   . Stroke [facility]     Past Surgical History  Procedure Laterality Date  . Cardiac catheterization    . Hernia repair    . Prostate surgery    . Eye surgery    . Coronary angioplasty    . Esphageal dilation        Medication List       This list is accurate as of: 02/16/16 11:59 PM.  Always use your most recent med list.               aspirin EC 81 MG tablet  Take 1 tablet (81 mg total) by mouth daily.     atorvastatin 20 MG tablet  Commonly known as:  LIPITOR  Take 20 mg by mouth at bedtime.     glipiZIDE 10 MG 24 hr tablet  Commonly known as:  GLUCOTROL XL  Take 10 mg by mouth daily.     LORazepam 0.5 MG tablet  Commonly known as:  ATIVAN  Take 1 tablet (0.5 mg total) by mouth at bedtime as needed for anxiety.     Melatonin 10 MG Tabs  Take 10 mg by mouth at bedtime.     omeprazole 20 MG capsule  Commonly known as:  PRILOSEC  Take 20 mg by mouth daily.     risperiDONE 0.25 MG tablet  Commonly known as:  RISPERDAL  Take 0.25 mg by mouth at bedtime.     sertraline 25 MG tablet  Commonly known as:  ZOLOFT  daily. Give 25 mg tablet  combined with 50 mg tablet  = 75 mg to be given daily by mouth     sertraline 50 MG tablet  Commonly known as:  ZOLOFT  Give 50 mg tablet combined with 25 mg tablet to = 75 mg to be given by mouth daily     tamsulosin 0.4 MG  Caps capsule  Commonly known as:  FLOMAX  Take 1 capsule (0.4 mg total) by mouth daily.     TYLENOL PM EXTRA STRENGTH PO  Take 1 tablet by mouth at bedtime.     UNABLE TO FIND  Med Name: Magic Cup give wit lunch and dinner to support weight status        No orders of the defined types were placed in this encounter.    Immunization History  Administered Date(s) Administered  . PPD Test 11/05/2015, 11/20/2015    Social History  Substance Use Topics  . Smoking status: Former Games developermoker  . Smokeless tobacco: Not on file  . Alcohol Use: 0.6 oz/week    1 Cans of beer per week     Comment: occasionallyee    Review of Systems  UTO 2/2 dementiaPlease see history of present illness      Physical Exam Dentures 97.2 pulse 95 respirations 18 blood pressure 142/68 O2 saturation is 90% on room air  GENERAL APPEARANCE: Alert,  No acute distress -Parker HaiLee seen he was coughing a significant amount again bringing up a significant amount of  phlegm did not appear to be any acute distress but was somewhat uncomfortable with the coughing SKIN: No diaphoresis rash HEENT: Unremarkable pharynx clear mucous membranes moist RESPIRATORY: Breathing is even, unlabored. Lung sounds are clear has somewhat poor respiratory effort however shallow air entry  CARDIOVASCULAR: Heart RRR no murmurs, rubs or gallops. No peripheral edema  GASTROINTESTINAL: Abdomen is soft, non-tender, not distended w/ normal bowel sounds.  MUSCULOSKELETAL: No abnormal joints or musculature--ambulates in a wheelchair NEUROLOGIC: Cranial nerves 2-12 grossly intact; :L side weakness PSYCHIATRIC: dementia, no behavioral issues  Patient Active Problem List   Diagnosis Date Noted  . Depression 01/04/2016  . HCAP (healthcare-associated pneumonia) 10/20/2015  . Cerebrovascular accident (CVA) due to stenosis of left middle cerebral artery (HCC) 08/27/2015  . Essential hypertension 08/27/2015  . Seizures (HCC) 08/27/2015  . Type 2 diabetes mellitus with circulatory disorder (HCC) 08/27/2015  . Dementia with behavioral disturbance 08/27/2015  . GERD (gastroesophageal reflux disease) 08/13/2015  . Vascular dementia with behavior disturbance 08/07/2015  . Dyslipidemia associated with type 2 diabetes mellitus (HCC) 08/07/2015  . 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 Subdural hematoma (HCC) 07/30/2015  . Dementia 06/23/2015  . BPH (benign prostatic hypertrophy) 06/23/2015  . CAD (coronary artery disease) s/p stent 06/18/2015  . Diabetes mellitus type 2, controlled (HCC) 06/18/2015  . Hyperlipidemia 06/18/2015  . Diabetes mellitus type 2 with neurological manifestations (HCC) 06/18/2015  . Cerebrovascular accident (CVA) (HCC)   . Intracranial carotid stenosis   . Stroke (HCC) 06/17/2015    CBC    Component Value Date/Time   WBC 6.4 08/04/2015 0525   RBC 3.79* 08/04/2015 0525   HGB 10.9* 08/04/2015 0525   HCT 32.4* 08/04/2015 0525   PLT 167 08/04/2015 0525   MCV 85.5  08/04/2015 0525   LYMPHSABS 1.4 07/30/2015 1030   MONOABS 0.4 07/30/2015 1030   EOSABS 0.6 07/30/2015 1030   BASOSABS 0.0 07/30/2015 1030    CMP     Component Value Date/Time   NA 137 08/04/2015 0525   K 3.8 08/04/2015 0525   CL 101 08/04/2015 0525   CO2 29 08/04/2015 0525   GLUCOSE 149* 08/04/2015 0525   BUN 22* 08/04/2015 0525   CREATININE 1.35* 08/04/2015 0525   CALCIUM 8.5* 08/04/2015 0525   PROT 5.8* 07/30/2015 1021   ALBUMIN 2.7* 07/30/2015 1021   AST 18 07/30/2015 1021   ALT  14* 07/30/2015 1021   ALKPHOS 97 07/30/2015 1021   BILITOT 0.5 07/30/2015 1021   GFRNONAA 46* 08/04/2015 0525   GFRAA 53* 08/04/2015 0525    Assessment and Plan  #1-coughing episode with concerns for aspiration-will order Atrovent nebulizers every 6 hours when necessary for 72 hours and monitor at this point appears to be back at his baseline-will order a 2 view chest x-ray as well as an monitor vital signs pulse ox every shift for 72 hours.  We will at this point change diet to Pureed and await ST evaluation   Also will update a CBC with differential as well as a basic metabolic panel tomorrow.  [id number]

## 2016-02-18 LAB — CBC AND DIFFERENTIAL
HCT: 36 % — AB (ref 41–53)
Hemoglobin: 11.8 g/dL — AB (ref 13.5–17.5)
Platelets: 154 10*3/uL (ref 150–399)
WBC: 5.5 10^3/mL

## 2016-02-18 LAB — BASIC METABOLIC PANEL
BUN: 15 mg/dL (ref 4–21)
CREATININE: 1.2 mg/dL (ref 0.6–1.3)
Glucose: 96 mg/dL
POTASSIUM: 3.6 mmol/L (ref 3.4–5.3)
Sodium: 142 mmol/L (ref 137–147)

## 2016-02-21 ENCOUNTER — Encounter: Payer: Self-pay | Admitting: Internal Medicine

## 2016-02-26 ENCOUNTER — Other Ambulatory Visit (HOSPITAL_COMMUNITY): Payer: Self-pay | Admitting: Internal Medicine

## 2016-02-26 DIAGNOSIS — R1310 Dysphagia, unspecified: Secondary | ICD-10-CM

## 2016-03-02 ENCOUNTER — Ambulatory Visit (HOSPITAL_COMMUNITY)
Admission: RE | Admit: 2016-03-02 | Discharge: 2016-03-02 | Disposition: A | Discharge status: Home / Self Care | Payer: Medicare Other | Source: Ambulatory Visit | Attending: Internal Medicine | Admitting: Internal Medicine

## 2016-03-02 DIAGNOSIS — R1310 Dysphagia, unspecified: Secondary | ICD-10-CM | POA: Insufficient documentation

## 2016-03-02 DIAGNOSIS — R1314 Dysphagia, pharyngoesophageal phase: Secondary | ICD-10-CM | POA: Diagnosis not present

## 2016-04-02 ENCOUNTER — Encounter: Payer: Self-pay | Admitting: Internal Medicine

## 2016-04-02 ENCOUNTER — Non-Acute Institutional Stay (SKILLED_NURSING_FACILITY): Payer: Medicare Other | Admitting: Internal Medicine

## 2016-04-02 DIAGNOSIS — Z7189 Other specified counseling: Secondary | ICD-10-CM

## 2016-04-02 NOTE — Progress Notes (Signed)
MRN: [id number] Name: Parker Hunter  Sex: male Age: 82 y.o. DOB: [birth year]  PSC #:  Facility/Room: [facility] / 321 P Level Of Care: SNF Provider: [name]. Lyn Hollingshead, MD Emergency Contacts: Extended Emergency Contact Information Primary Emergency Contact: Cahue,[name] Address: [address]          [place],  [address] Home Phone: [phone] Relation: None Secondary Emergency Contact: Herbert Seta [place] Mobile Phone: [phone] Relation: None  Code Status: DNR  Allergies: Ibuprofen  Chief Complaint  Patient presents with  . Acute Visit    Acute    HPI: Patient is 82 y.o. male who is being seen with his wife and POA by myself and SW to discuss MOST plans for the pt's FTT.  Past Medical History  Diagnosis Date  . Diabetes mellitus without complication (HCC)   . TIA (transient ischemic attack)   . Neuropathy (HCC)   . Status post dilation of esophageal narrowing   . Hypertension   . Stented coronary artery   . Stroke [facility])     Past Surgical History  Procedure Laterality Date  . Cardiac catheterization    . Hernia repair    . Prostate surgery    . Eye surgery    . Coronary angioplasty    . Esphageal dilation        Medication List       This list is accurate as of: 04/02/16  9:23 PM.  Always use your most recent med list.               aspirin EC 81 MG tablet  Take 1 tablet (81 mg total) by mouth daily.     atorvastatin 20 MG tablet  Commonly known as:  LIPITOR  Take 20 mg by mouth at bedtime.     glipiZIDE 10 MG 24 hr tablet  Commonly known as:  GLUCOTROL XL  Take 10 mg by mouth daily.     ipratropium 0.02 % nebulizer solution  Commonly known as:  ATROVENT  Take 0.5 mg by nebulization every 6 (six) hours as needed for wheezing or shortness of breath.     LORazepam 0.5 MG tablet  Commonly known as:  ATIVAN  Take 1 tablet (0.5 mg total) by mouth at bedtime as needed for anxiety.     Melatonin 10 MG Tabs  Take 10 mg by mouth  at bedtime.     omeprazole 20 MG capsule  Commonly known as:  PRILOSEC  Take 20 mg by mouth daily.     risperiDONE 0.25 MG tablet  Commonly known as:  RISPERDAL  Take 0.25 mg by mouth at bedtime.     sertraline 100 MG tablet  Commonly known as:  ZOLOFT  Take 100 mg by mouth daily. Anxiety/depression     tamsulosin 0.4 MG Caps capsule  Commonly known as:  FLOMAX  Take 0.4 mg by mouth daily after supper. 30 mins after supper     TYLENOL PM EXTRA STRENGTH PO  Take 1 tablet by mouth at bedtime. insomnia     UNABLE TO FIND  Med Name: Magic Cup give wit lunch and dinner to support weight status        Meds ordered this encounter  Medications  . ipratropium (ATROVENT) 0.02 % nebulizer solution    Sig: Take 0.5 mg by nebulization every 6 (six) hours as needed for wheezing or shortness of breath.  . sertraline (ZOLOFT) 100 MG tablet    Sig: Take 100 mg by  mouth daily. Anxiety/depression  . tamsulosin (FLOMAX) 0.4 MG CAPS capsule    Sig: Take 0.4 mg by mouth daily after supper. 30 mins after supper    Immunization History  Administered Date(s) Administered  . PPD Test 11/05/2015, 11/20/2015    Social History  Substance Use Topics  . Smoking status: Former Games developer  . Smokeless tobacco: Not on file  . Alcohol Use: 0.6 oz/week    1 Cans of beer per week     Comment: occasionallyee    Review of Systems  DATA OBTAINED: from patient,wife GENERAL:  no fevers, fatigue, appetite waxes and wanes SKIN: No itching, rash HEENT: No complaint RESPIRATORY: No cough, wheezing, SOB CARDIAC: No chest pain, palpitations, lower extremity edema  GI: No abdominal pain, No N/V/D or constipation, No heartburn or reflux  GU: No dysuria, frequency or urgency, or incontinence  MUSCULOSKELETAL: No unrelieved bone/joint pain NEUROLOGIC: No headache, dizziness  PSYCHIATRIC: No overt anxiety or sadness  Filed Vitals:   04/02/16 0911  BP: 166/67  Pulse: 94  Temp: 98.3 F (36.8 C)  Resp: 16     Physical Exam  GENERAL APPEARANCE: Alert, mod conversant, No acute distress  SKIN: No diaphoresis rash HEENT: Unremarkable RESPIRATORY: Breathing is even, unlabored. Lung sounds are clear   CARDIOVASCULAR: Heart RRR no murmurs, rubs or gallops. No peripheral edema  GASTROINTESTINAL: Abdomen is soft, non-tender, not distended w/ normal bowel sounds.  GENITOURINARY: Bladder non tender, not distended  MUSCULOSKELETAL: No abnormal joints or musculature NEUROLOGIC: Cranial nerves 2-12 grossly intact. Moves all extremities PSYCHIATRIC: Mood and affect appropriate to situation with dementia, no behavioral issues  Patient Active Problem List   Diagnosis Date Noted  . Depression 01/04/2016  . HCAP (healthcare-associated pneumonia) 10/20/2015  . Cerebrovascular accident (CVA) due to stenosis of left middle cerebral artery (HCC) 08/27/2015  . Essential hypertension 08/27/2015  . Seizures (HCC) 08/27/2015  . Type 2 diabetes mellitus with circulatory disorder (HCC) 08/27/2015  . Dementia with behavioral disturbance 08/27/2015  . GERD (gastroesophageal reflux disease) 08/13/2015  . Vascular dementia with behavior disturbance 08/07/2015  . Dyslipidemia associated with type 2 diabetes mellitus (HCC) 08/07/2015  . 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 Subdural hematoma (HCC) 07/30/2015  . Dementia 06/23/2015  . BPH (benign prostatic hypertrophy) 06/23/2015  . CAD (coronary artery disease) s/p stent 06/18/2015  . Diabetes mellitus type 2, controlled (HCC) 06/18/2015  . Hyperlipidemia 06/18/2015  . Diabetes mellitus type 2 with neurological manifestations (HCC) 06/18/2015  . Cerebrovascular accident (CVA) (HCC)   . Intracranial carotid stenosis   . Stroke (HCC) 06/17/2015    CBC    Component Value Date/Time   WBC 5.5 02/18/2016   WBC 6.4 08/04/2015 0525   RBC 3.79* 08/04/2015 0525   HGB 11.8* 02/18/2016   HCT 36* 02/18/2016   PLT 154 02/18/2016   MCV 85.5 08/04/2015 0525   LYMPHSABS  1.4 07/30/2015 1030   MONOABS 0.4 07/30/2015 1030   EOSABS 0.6 07/30/2015 1030   BASOSABS 0.0 07/30/2015 1030    CMP     Component Value Date/Time   NA 142 02/18/2016   NA 137 08/04/2015 0525   K 3.6 02/18/2016   CL 101 08/04/2015 0525   CO2 29 08/04/2015 0525   GLUCOSE 149* 08/04/2015 0525   BUN 15 02/18/2016   BUN 22* 08/04/2015 0525   CREATININE 1.2 02/18/2016   CREATININE 1.35* 08/04/2015 0525   CALCIUM 8.5* 08/04/2015 0525   PROT 5.8* 07/30/2015 1021   ALBUMIN 2.7* 07/30/2015 1021   AST  18 07/30/2015 1021   ALT 14* 07/30/2015 1021   ALKPHOS 97 07/30/2015 1021   BILITOT 0.5 07/30/2015 1021   GFRNONAA 46* 08/04/2015 0525   GFRAA 53* 08/04/2015 0525    Assessment and Plan  ENCOUNTER FOR FAMILY CONFERENCE WITH PT PRESENT- spoke with wife in detail about general approach to death and any prior wishes pt may have expressed, then honed down to the particulars on the MOST form. I assured wife if she needed more time to think about things it didn't have to be done now but she said thinking about wouldn't change what we had discussed or make it easier; End result- NO HOSPITALIZATIONS, DNR, NO PEG TUBE, IV on case by case basis, treat infections in house. Pt's wife was wonderful, excellent grasp of life/death, will need support to help her do the right thing at the right time (don't we all!)   Time spent > 35 min Jason Frisbee D.Lyn HollingsheadAlexander, MD

## 2016-04-13 ENCOUNTER — Encounter: Payer: Self-pay | Admitting: Internal Medicine

## 2016-04-13 ENCOUNTER — Non-Acute Institutional Stay (SKILLED_NURSING_FACILITY): Payer: Medicare Other | Admitting: Internal Medicine

## 2016-04-13 DIAGNOSIS — I63512 Cerebral infarction due to unspecified occlusion or stenosis of left middle cerebral artery: Secondary | ICD-10-CM | POA: Diagnosis not present

## 2016-04-13 DIAGNOSIS — E1149 Type 2 diabetes mellitus with other diabetic neurological complication: Secondary | ICD-10-CM

## 2016-04-13 DIAGNOSIS — I10 Essential (primary) hypertension: Secondary | ICD-10-CM | POA: Diagnosis not present

## 2016-04-13 DIAGNOSIS — F0391 Unspecified dementia with behavioral disturbance: Secondary | ICD-10-CM

## 2016-04-13 DIAGNOSIS — R569 Unspecified convulsions: Secondary | ICD-10-CM

## 2016-04-13 DIAGNOSIS — Z8701 Personal history of pneumonia (recurrent): Secondary | ICD-10-CM

## 2016-04-13 NOTE — Progress Notes (Signed)
Location:   [facility] Room Number: 321/P Place of Service:  SNF [phone]) Provider:  Ellison Carwin, MD  Patient Care Team: Wilburn Mylar, MD as PCP - General (Family Medicine)  Extended Emergency Contact Information Primary Emergency Contact: Geiman,[name] Address: [address]          [place],  [address] Home Phone: [phone] Relation: None Secondary Emergency Contact: Herbert Seta [place] Mobile Phone: [phone] Relation: None  Code Status:  DNR Goals of care: Advanced Directive information Advanced Directives 04/13/2016  Does patient have an advance directive? Yes  Type of Advance Directive Living will;Out of facility DNR (pink MOST or yellow form)  Does patient want to make changes to advanced directive? No - Patient declined  Copy of advanced directive(s) in chart? Yes     Chief Complaint  Patient presents with  . Medical Management of Chronic Issues    Routine Visit  For medical management of chronic medical issues including vascular dementia history of seizures coronary artery disease status post stent depression-psychosis  HPI:  Pt is a 82 y.o. male seen today for medical management of chronic diseases as noted above-patient was recently seen by Dr. Lyn Hollingshead in discussions were made with his wife about aggressiveness of care and a MOST form has been completed with orders for comfort measures no hospitalization limited use of antibiotics and IV fluids for only a defined trial period no feeding tube.  . He has been relatively stable although he does appear to have progressive dementia which meanst risk for numerous issues including aspiration.  Although nursing does not report any recent acute issues.  He does have a history of diabetes type 2 and a left frontal CVA in 2016 as well as chronic kidney disease stage III and coronary artery disease status post stent placement.  Back in November 2016 was admitted to the hospital for  large left hemisphere hypodensity suggestive of an old or subacute subdural hematoma and also finding of a possible new subdural hematoma.  Patient was on aspirin and Plavix they were discontinued he's been restarted on aspirin 81 mg a day.       Currently he has no complaints although again this is quite limited secondary to dementia       Past Medical History:  Diagnosis Date  . Diabetes mellitus without complication (HCC)   . Hypertension   . Neuropathy (HCC)   . Status post dilation of esophageal narrowing   . Stented coronary artery   . Stroke (HCC)   . TIA (transient ischemic attack)    Past Surgical History:  Procedure Laterality Date  . CARDIAC CATHETERIZATION    . CORONARY ANGIOPLASTY    . esphageal dilation    . EYE SURGERY    . HERNIA REPAIR    . PROSTATE SURGERY      Allergies  Allergen Reactions  . Ibuprofen Other (See Comments)    GI Bleeding      Medication List       Accurate as of 04/13/16  4:06 PM. Always use your most recent med list.          aspirin EC 81 MG tablet Take 1 tablet (81 mg total) by mouth daily.   atorvastatin 20 MG tablet Commonly known as:  LIPITOR Take 20 mg by mouth at bedtime.   glipiZIDE 10 MG 24 hr tablet Commonly known as:  GLUCOTROL XL Take 10 mg by mouth daily.   ipratropium 0.02 %  nebulizer solution Commonly known as:  ATROVENT Take 0.5 mg by nebulization every 6 (six) hours as needed for wheezing or shortness of breath.   LORazepam 0.5 MG tablet Commonly known as:  ATIVAN Take 1 tablet (0.5 mg total) by mouth at bedtime as needed for anxiety.   Melatonin 10 MG Tabs Take 10 mg by mouth at bedtime.   omeprazole 20 MG capsule Commonly known as:  PRILOSEC Take 20 mg by mouth daily.   risperiDONE 0.25 MG tablet Commonly known as:  RISPERDAL Take 0.25 mg by mouth at bedtime.   sertraline 100 MG tablet Commonly known as:  ZOLOFT Take 100 mg by mouth daily. Anxiety/depression   tamsulosin 0.4 MG  Caps capsule Commonly known as:  FLOMAX Take 0.4 mg by mouth daily after supper. 30 mins after supper   TYLENOL PM EXTRA STRENGTH PO Take 1 tablet by mouth at bedtime. insomnia   UNABLE TO FIND Med Name: Magic Cup give wit lunch and dinner to support weight status       Review of Systems   Limited secondary to dementia please see history of present illness again there's been no recent episodes of increased shortness of breath chest pain apparently does well with supportive care   Immunization History  Administered Date(s) Administered  . PPD Test 11/05/2015, 11/20/2015   Pertinent  Health Maintenance Due  Topic Date Due  . INFLUENZA VACCINE  04/14/2016 (Originally 04/13/2016)  . HEMOGLOBIN A1C  09/13/2016 (Originally 12/17/2015)  . OPHTHALMOLOGY EXAM  09/13/2016 (Originally 03/31/1937)  . URINE MICROALBUMIN  09/13/2016 (Originally 03/31/1937)  . PNA vac Low Risk Adult (1 of 2 - PCV13) 09/14/2023 (Originally 03/30/1994)  . FOOT EXAM  12/03/2016   Fall Risk  08/27/2015  Falls in the past year? No   Functional Status Survey:    Vitals:   04/13/16 1547  BP: 135/73  Pulse: 78  Resp: 20  Temp: 98.8 F (37.1 C)  TempSrc: Oral  Weight: 143 lb 9.6 oz (65.1 kg)  Height: 5\' 6"  (1.676 m)   Body mass index is 23.18 kg/m. Physical Exam  GENERAL APPEARANCE: Alert,  No acute distress  Sitting comfortably in his wheelchair SKIN: No diaphoresis rash HEENT: Unremarkable oropharynx clear mucous membranes moist RESPIRATORY: Breathing is even, unlabored. Lung sounds are clear with some mild bronchial sounds on expiration --shallow air entry  CARDIOVASCULAR: Heart RRR no murmurs, rubs or gallops. No peripheral edema  GASTROINTESTINAL: Abdomen is soft, non-tender, not distended w/ normal bowel sounds.  MUSCULOSKELETAL: No abnormal joints or musculature--ambulates in a wheelchair NEUROLOGIC: Cranial nerves 2-12 grossly intact; :L side weakness PSYCHIATRIC: dementia, no behavioral issues  follows simple verbal commands   Labs reviewed:  Recent Labs  08/01/15 0719 08/03/15 0459 08/04/15 0525 02/18/16  NA 137 138 137 142  K 3.7 3.6 3.8 3.6  CL 103 102 101  --   CO2 26 29 29   --   GLUCOSE 149* 153* 149*  --   BUN 17 24* 22* 15  CREATININE 1.24 1.37* 1.35* 1.2  CALCIUM 8.7* 8.6* 8.5*  --     Recent Labs  06/18/15 0248 07/30/15 1021  AST 40 18  ALT 17 14*  ALKPHOS 73 97  BILITOT 0.9 0.5  PROT 6.2* 5.8*  ALBUMIN 3.3* 2.7*    Recent Labs  05/14/15 1830 06/17/15 1927  07/30/15 1030  08/01/15 0719 08/03/15 0459 08/04/15 0525 02/18/16  WBC 10.4 8.0  < >  --   < > 6.6 6.8 6.4 5.5  NEUTROABS 7.8* 4.8  --  3.4  --   --   --   --   --   HGB 13.9 13.2  < >  --   < > 11.3* 11.4* 10.9* 11.8*  HCT 39.7 37.6*  < >  --   < > 32.7* 34.0* 32.4* 36*  MCV 85.9 85.8  < >  --   < > 85.2 86.3 85.5  --   PLT 161 183  < >  --   < > 144* 162 167 154  < > = values in this interval not displayed. No results found for: TSH Lab Results  Component Value Date   HGBA1C 7.0 (H) 06/18/2015   Lab Results  Component Value Date   CHOL 145 06/18/2015   HDL 29 (L) 06/18/2015   LDLCALC 92 06/18/2015   TRIG 121 06/18/2015   CHOLHDL 5.0 06/18/2015    Significant Diagnostic Results in last 30 days:  No results found.  Assessment/Plan History CVA-he continues on a statin as well as low-dose aspirin 81 mg a day he's not on any more aggressive anticoagulation secondary to history of subdural hematoma.  History of subdural hematoma as noted above Plavix was discontinued he continues on low-dose aspirin appears relatively stable in this regards.  History of dementia he appears to be doing well with supportive care continues on Risperdal as well as Ativan at bedtime.  History of seizures had been on Keppra at some point but this was discontinued by neurology secondary to an EEG done which showed no seizure activity.  Diabetes type 2 hemoglobin A1c most recently 7.0 on  glipizide-will order CBGs every morning for now to monitor this if stable I suspect we can cut down on this significantly since emphasis is on comfort.  Hypertension appears stable on no meds.  Coronary artery disease continues on aspirin only and a statin again not on Plavix because of the subdural hematoma.  History of GERD this appears relatively stable on Prilosec.  History of BPH she is on Flomax this apparently has not been a significant issue recently.  History of a pneumonia-he continues at risk with this with his progressive dementia but this is been relatively stable I do see him several weeks ago for coughing episode chest x-ray really did not show any aspiration which was fortunate although again he does remain at risk  At this point secondary to patient being under essentially comfort status will not order labs clinically he appears to be at baseline and monitor accordingly again we will check CBGs at least temporarily to keep eye on this but I suspect we will be quite conservative here as well if blood sugars are stable   [phone]

## 2016-04-30 LAB — CBC AND DIFFERENTIAL
HCT: 40 % — AB (ref 41–53)
Hemoglobin: 12.9 g/dL — AB (ref 13.5–17.5)
Platelets: 175 10*3/uL (ref 150–399)
WBC: 7.1 10^3/mL

## 2016-04-30 LAB — LIPID PANEL
CHOLESTEROL: 117 mg/dL (ref 0–200)
HDL: 30 mg/dL — AB (ref 35–70)
LDL CALC: 59 mg/dL
TRIGLYCERIDES: 139 mg/dL (ref 40–160)

## 2016-04-30 LAB — HEMOGLOBIN A1C: HEMOGLOBIN A1C: 6.3

## 2016-05-10 ENCOUNTER — Encounter: Payer: Self-pay | Admitting: Internal Medicine

## 2016-05-10 ENCOUNTER — Non-Acute Institutional Stay (SKILLED_NURSING_FACILITY): Payer: Medicare Other | Admitting: Internal Medicine

## 2016-05-10 DIAGNOSIS — T17998A Other foreign object in respiratory tract, part unspecified causing other injury, initial encounter: Secondary | ICD-10-CM

## 2016-05-10 DIAGNOSIS — R1310 Dysphagia, unspecified: Secondary | ICD-10-CM

## 2016-05-10 DIAGNOSIS — T17908A Unspecified foreign body in respiratory tract, part unspecified causing other injury, initial encounter: Secondary | ICD-10-CM

## 2016-05-10 NOTE — Progress Notes (Signed)
MRN: [id number] Name: Sani Madariaga  Sex: male Age: 82 y.o. DOB: 18-Feb-1927  PSC #:  Facility/Room: [facility] / 321 P Level Of Care: SNF Provider: [name]. Lyn Hollingshead, MD Emergency Contacts: Extended Emergency Contact Information Primary Emergency Contact: Guyton,[name] Address: [address]          [place],  [address] Home Phone: [phone] Relation: None Secondary Emergency Contact: Herbert Seta [place] Mobile Phone: [phone] Relation: None   Code Status: DNR  Allergies: Ibuprofen  Chief Complaint  Patient presents with  . Acute Visit    Acute     HPI: Patient is 82 y.o. male who nursing asked me to see because he had a chocking episode with breakfast this am. The nursing supervisor suctioned him and at that time time O2 sat was 95% and BP 174/89, lungs sounded cleared RR 28. After suction and neb tx BP was 159/89, O2 sat 98%, RR 22.  Past Medical History:  Diagnosis Date  . Diabetes mellitus without complication (HCC)   . Hypertension   . Neuropathy (HCC)   . Status post dilation of esophageal narrowing   . Stented coronary artery   . Stroke (HCC)   . TIA (transient ischemic attack)     Past Surgical History:  Procedure Laterality Date  . CARDIAC CATHETERIZATION    . CORONARY ANGIOPLASTY    . esphageal dilation    . EYE SURGERY    . HERNIA REPAIR    . PROSTATE SURGERY        Medication List       Accurate as of 05/10/16 11:53 AM. Always use your most recent med list.          aspirin EC 81 MG tablet Take 1 tablet (81 mg total) by mouth daily.   atorvastatin 20 MG tablet Commonly known as:  LIPITOR Take 20 mg by mouth at bedtime.   glipiZIDE 10 MG 24 hr tablet Commonly known as:  GLUCOTROL XL Take 10 mg by mouth daily.   ipratropium 0.02 % nebulizer solution Commonly known as:  ATROVENT Take 0.5 mg by nebulization every 6 (six) hours as needed for wheezing or shortness of breath.   LORazepam 0.5 MG tablet Commonly known as:   ATIVAN Take 1 tablet (0.5 mg total) by mouth at bedtime as needed for anxiety.   Melatonin 10 MG Tabs Take 10 mg by mouth at bedtime.   omeprazole 20 MG capsule Commonly known as:  PRILOSEC Take 20 mg by mouth daily.   risperiDONE 0.25 MG tablet Commonly known as:  RISPERDAL Take 0.25 mg by mouth at bedtime.   sertraline 100 MG tablet Commonly known as:  ZOLOFT Take 100 mg by mouth daily. Anxiety/depression   tamsulosin 0.4 MG Caps capsule Commonly known as:  FLOMAX Take 0.4 mg by mouth daily after supper. 30 mins after supper   TYLENOL PM EXTRA STRENGTH PO Take 1 tablet by mouth at bedtime. insomnia   UNABLE TO FIND Med Name: Magic Cup give with lunch and dinner to support weight status       No orders of the defined types were placed in this encounter.   Immunization History  Administered Date(s) Administered  . PPD Test 11/05/2015, 11/20/2015    Social History  Substance Use Topics  . Smoking status: Former Games developer  . Smokeless tobacco: Never Used  . Alcohol use 0.6 oz/week    1 Cans of beer per week     Comment: occasionallyee    Review  of Systems  UTO 2/2 dementia; per nursing as per HPI    Vitals:   05/10/16 1135  BP: 135/73  Pulse: 69  Resp: 16  Temp: 98.8 F (37.1 C)    Physical Exam  GENERAL APPEARANCE: Alert,non conversant, No acute distress , sitting in WC in activities room SKIN: No diaphoresis rash HEENT: Unremarkable RESPIRATORY: Breathing is even, unlabored. Lung sounds are clear   CARDIOVASCULAR: Heart RRR no murmurs, rubs or gallops. No peripheral edema  GASTROINTESTINAL: Abdomen is soft, non-tender, not distended w/ normal bowel sounds.  GENITOURINARY: Bladder non tender, not distended  MUSCULOSKELETAL: No abnormal joints or musculature NEUROLOGIC: Cranial nerves 2-12 grossly intact. Moves all extremities PSYCHIATRIC: dementia, no behavioral issues  Patient Active Problem List   Diagnosis Date Noted  . Depression 01/04/2016   . HCAP (healthcare-associated pneumonia) 10/20/2015  . Cerebrovascular accident (CVA) due to stenosis of left middle cerebral artery (HCC) 08/27/2015  . Essential hypertension 08/27/2015  . Seizures (HCC) 08/27/2015  . Type 2 diabetes mellitus with circulatory disorder (HCC) 08/27/2015  . Dementia with behavioral disturbance 08/27/2015  . GERD (gastroesophageal reflux disease) 08/13/2015  . Vascular dementia with behavior disturbance 08/07/2015  . Dyslipidemia associated with type 2 diabetes mellitus (HCC) 08/07/2015  . 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 Subdural hematoma (HCC) 07/30/2015  . Dementia 06/23/2015  . BPH (benign prostatic hypertrophy) 06/23/2015  . CAD (coronary artery disease) s/p stent 06/18/2015  . Diabetes mellitus type 2, controlled (HCC) 06/18/2015  . Hyperlipidemia 06/18/2015  . Diabetes mellitus type 2 with neurological manifestations (HCC) 06/18/2015  . Cerebrovascular accident (CVA) (HCC)   . Intracranial carotid stenosis   . Stroke (HCC) 06/17/2015    CBC    Component Value Date/Time   WBC 7.1 04/30/2016   WBC 6.4 08/04/2015 0525   RBC 3.79 (L) 08/04/2015 0525   HGB 12.9 (A) 04/30/2016   HCT 40 (A) 04/30/2016   PLT 175 04/30/2016   MCV 85.5 08/04/2015 0525   LYMPHSABS 1.4 07/30/2015 1030   MONOABS 0.4 07/30/2015 1030   EOSABS 0.6 07/30/2015 1030   BASOSABS 0.0 07/30/2015 1030    CMP     Component Value Date/Time   NA 142 02/18/2016   K 3.6 02/18/2016   CL 101 08/04/2015 0525   CO2 29 08/04/2015 0525   GLUCOSE 149 (H) 08/04/2015 0525   BUN 15 02/18/2016   CREATININE 1.2 02/18/2016   CREATININE 1.35 (H) 08/04/2015 0525   CALCIUM 8.5 (L) 08/04/2015 0525   PROT 5.8 (L) 07/30/2015 1021   ALBUMIN 2.7 (L) 07/30/2015 1021   AST 18 07/30/2015 1021   ALT 14 (L) 07/30/2015 1021   ALKPHOS 97 07/30/2015 1021   BILITOT 0.5 07/30/2015 1021   GFRNONAA 46 (L) 08/04/2015 0525   GFRAA 53 (L) 08/04/2015 0525    Assessment and Plan  DYSPHASIA/   ASPIRATION - Pt appears to be fine at this time; however we will monitor him over several days for development of PNA   Time spent > 25 min;> 50% of time with patient was spent reviewing records, labs, tests and studies, counseling and developing plan of care  Abron Neddo D. Lyn HollingsheadAlexander, MD

## 2016-05-20 ENCOUNTER — Non-Acute Institutional Stay (SKILLED_NURSING_FACILITY): Payer: Medicare Other | Admitting: Internal Medicine

## 2016-05-20 ENCOUNTER — Encounter: Payer: Self-pay | Admitting: Internal Medicine

## 2016-05-20 DIAGNOSIS — E785 Hyperlipidemia, unspecified: Secondary | ICD-10-CM | POA: Diagnosis not present

## 2016-05-20 DIAGNOSIS — E1169 Type 2 diabetes mellitus with other specified complication: Secondary | ICD-10-CM

## 2016-05-20 DIAGNOSIS — F29 Unspecified psychosis not due to a substance or known physiological condition: Secondary | ICD-10-CM | POA: Diagnosis not present

## 2016-05-20 DIAGNOSIS — E1149 Type 2 diabetes mellitus with other diabetic neurological complication: Secondary | ICD-10-CM | POA: Diagnosis not present

## 2016-05-20 NOTE — Progress Notes (Signed)
MRN: [id number] Name: Parker Hunter  Sex: male Age: 82 y.o. DOB: 03-15-1927  PSC #:  Facility/Room: [facility] / 321 P Level Of Care: SNF Provider: [name]. Lyn Hollingshead, MD Emergency Contacts: Extended Emergency Contact Information Primary Emergency Contact: Zeiders,[name] Address: [address]          [place],  [address] Home Phone: [phone] Relation: None Secondary Emergency Contact: Herbert Seta [place] Mobile Phone: [phone] Relation: None  Code Status: DNR  Allergies: Ibuprofen  Chief Complaint  Patient presents with  . Medical Management of Chronic Issues    Routine Visit    HPI: Patient is 82 y.o. male who is being seen for routine issues of DM2, HLD and psychosis.  Past Medical History:  Diagnosis Date  . Diabetes mellitus without complication (HCC)   . Hypertension   . Neuropathy (HCC)   . Status post dilation of esophageal narrowing   . Stented coronary artery   . Stroke (HCC)   . TIA (transient ischemic attack)     Past Surgical History:  Procedure Laterality Date  . CARDIAC CATHETERIZATION    . CORONARY ANGIOPLASTY    . esphageal dilation    . EYE SURGERY    . HERNIA REPAIR    . PROSTATE SURGERY        Medication List       Accurate as of 05/20/16 11:59 PM. Always use your most recent med list.          aspirin EC 81 MG tablet Take 1 tablet (81 mg total) by mouth daily.   atorvastatin 20 MG tablet Commonly known as:  LIPITOR Take 20 mg by mouth at bedtime.   glipiZIDE 10 MG 24 hr tablet Commonly known as:  GLUCOTROL XL Take 10 mg by mouth daily.   ipratropium 0.02 % nebulizer solution Commonly known as:  ATROVENT Take 0.5 mg by nebulization every 6 (six) hours as needed for wheezing or shortness of breath.   LORazepam 0.5 MG tablet Commonly known as:  ATIVAN Take 1 tablet (0.5 mg total) by mouth at bedtime as needed for anxiety.   Melatonin 10 MG Tabs Take 10 mg by mouth at bedtime.   omeprazole 20 MG  capsule Commonly known as:  PRILOSEC Take 20 mg by mouth daily.   risperiDONE 0.25 MG tablet Commonly known as:  RISPERDAL Take 0.25 mg by mouth at bedtime.   sertraline 100 MG tablet Commonly known as:  ZOLOFT Take 100 mg by mouth daily. Anxiety/depression   tamsulosin 0.4 MG Caps capsule Commonly known as:  FLOMAX Take 0.4 mg by mouth daily after supper. 30 mins after supper   TYLENOL PM EXTRA STRENGTH PO Take 1 tablet by mouth at bedtime. insomnia   UNABLE TO FIND Med Name: Magic Cup give with lunch and dinner to support weight status       No orders of the defined types were placed in this encounter.   Immunization History  Administered Date(s) Administered  . PPD Test 11/05/2015, 11/20/2015    Social History  Substance Use Topics  . Smoking status: Former Games developer  . Smokeless tobacco: Never Used  . Alcohol use 0.6 oz/week    1 Cans of beer per week     Comment: occasionallyee    Review of Systems  UTO 2/2 dementia    Vitals:   05/20/16 1008  BP: 135/73  Pulse: 69  Resp: 16  Temp: 98.8 F (37.1 C)    Physical Exam  GENERAL APPEARANCE:  Alert, No acute distress  SKIN: No diaphoresis rash HEENT: Unremarkable RESPIRATORY: Breathing is even, unlabored. Lung sounds are clear   CARDIOVASCULAR: Heart RRR no murmurs, rubs or gallops. No peripheral edema  GASTROINTESTINAL: Abdomen is soft, non-tender, not distended w/ normal bowel sounds.  GENITOURINARY: Bladder non tender, not distended  MUSCULOSKELETAL: No abnormal joints or musculature NEUROLOGIC: Cranial nerves 2-12 grossly intact; L side weakness PSYCHIATRIC: dementia, no behavioral issues  Patient Active Problem List   Diagnosis Date Noted  . Depression 01/04/2016  . HCAP (healthcare-associated pneumonia) 10/20/2015  . Cerebrovascular accident (CVA) due to stenosis of left middle cerebral artery (HCC) 08/27/2015  . Essential hypertension 08/27/2015  . Seizures (HCC) 08/27/2015  . Type 2  diabetes mellitus with circulatory disorder (HCC) 08/27/2015  . Dementia with behavioral disturbance 08/27/2015  . GERD (gastroesophageal reflux disease) 08/13/2015  . Vascular dementia with behavior disturbance 08/07/2015  . Dyslipidemia associated with type 2 diabetes mellitus (HCC) 08/07/2015  . 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 Subdural hematoma (HCC) 07/30/2015  . Dementia 06/23/2015  . BPH (benign prostatic hypertrophy) 06/23/2015  . CAD (coronary artery disease) s/p stent 06/18/2015  . Diabetes mellitus type 2, controlled (HCC) 06/18/2015  . Hyperlipidemia 06/18/2015  . Diabetes mellitus type 2 with neurological manifestations (HCC) 06/18/2015  . Cerebrovascular accident (CVA) (HCC)   . Intracranial carotid stenosis   . Stroke (HCC) 06/17/2015    CBC    Component Value Date/Time   WBC 7.1 04/30/2016   WBC 6.4 08/04/2015 0525   RBC 3.79 (L) 08/04/2015 0525   HGB 12.9 (A) 04/30/2016   HCT 40 (A) 04/30/2016   PLT 175 04/30/2016   MCV 85.5 08/04/2015 0525   LYMPHSABS 1.4 07/30/2015 1030   MONOABS 0.4 07/30/2015 1030   EOSABS 0.6 07/30/2015 1030   BASOSABS 0.0 07/30/2015 1030    CMP     Component Value Date/Time   NA 142 02/18/2016   K 3.6 02/18/2016   CL 101 08/04/2015 0525   CO2 29 08/04/2015 0525   GLUCOSE 149 (H) 08/04/2015 0525   BUN 15 02/18/2016   CREATININE 1.2 02/18/2016   CREATININE 1.35 (H) 08/04/2015 0525   CALCIUM 8.5 (L) 08/04/2015 0525   PROT 5.8 (L) 07/30/2015 1021   ALBUMIN 2.7 (L) 07/30/2015 1021   AST 18 07/30/2015 1021   ALT 14 (L) 07/30/2015 1021   ALKPHOS 97 07/30/2015 1021   BILITOT 0.5 07/30/2015 1021   GFRNONAA 46 (L) 08/04/2015 0525   GFRAA 53 (L) 08/04/2015 0525    Assessment and Plan  Diabetes mellitus type 2 with neurological manifestations (HCC) In 04/2016 A1c is 6.3; 4 months ago it was 9.4;plan to cont glucatrol XL 10 mg daily; pt is on statin ; diabetic eye exam and microalbumin have been orderd  Dyslipidemia associated  with type 2 diabetes mellitus (HCC) In 04/2016 LDL 59 and HDL 30; well controlled on lipitor 20 mg daily  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 Lyn HollingsheadAlexander, MD

## 2016-05-23 ENCOUNTER — Encounter: Payer: Self-pay | Admitting: Internal Medicine

## 2016-05-23 NOTE — Assessment & Plan Note (Signed)
No reported episodes; plan to cont risperdal 0.25 mg qHS

## 2016-05-23 NOTE — Assessment & Plan Note (Signed)
In 04/2016 A1c is 6.3; 4 months ago it was 9.4;plan to cont glucatrol XL 10 mg daily; pt is on statin ; diabetic eye exam and microalbumin have been orderd

## 2016-05-23 NOTE — Assessment & Plan Note (Signed)
In 04/2016 LDL 59 and HDL 30; well controlled on lipitor 20 mg daily

## 2016-06-15 LAB — LIPID PANEL
CHOLESTEROL: 121 mg/dL (ref 0–200)
HDL: 29 mg/dL — AB (ref 35–70)
LDL CALC: 41 mg/dL
TRIGLYCERIDES: 256 mg/dL — AB (ref 40–160)

## 2016-06-15 LAB — CBC AND DIFFERENTIAL
HEMATOCRIT: 40 % — AB (ref 41–53)
Hemoglobin: 13.3 g/dL — AB (ref 13.5–17.5)
Platelets: 166 10*3/uL (ref 150–399)
WBC: 8.1 10^3/mL

## 2016-06-17 ENCOUNTER — Encounter: Payer: Self-pay | Admitting: Internal Medicine

## 2016-06-17 ENCOUNTER — Non-Acute Institutional Stay (SKILLED_NURSING_FACILITY): Payer: Medicare Other | Admitting: Internal Medicine

## 2016-06-17 DIAGNOSIS — I10 Essential (primary) hypertension: Secondary | ICD-10-CM

## 2016-06-17 DIAGNOSIS — K219 Gastro-esophageal reflux disease without esophagitis: Secondary | ICD-10-CM

## 2016-06-17 DIAGNOSIS — I2510 Atherosclerotic heart disease of native coronary artery without angina pectoris: Secondary | ICD-10-CM

## 2016-06-17 NOTE — Progress Notes (Signed)
[facility] Room Number: 321P Place of Service:  SNF (31)  Randon Goldsmithnne D. Lyn HollingsheadAlexander, MD  Patient Care Team: Wilburn MylarSamuel S Kelly, MD as PCP - General [facility](Family Medicine)  Extended Emergency Contact Information Primary Emergency Contact: Sabourin,[name] Address: [address]          [place],  [id number] Home Phone: [phone] Relation: None Secondary Emergency Contact: Herbert SetaYard,Robin  [place] Mobile Phone: [phone] Relation: None    Allergies: Ibuprofen  Chief Complaint  Patient presents with  . Medical Management of Chronic Issues    Routine Visit    HPI: Patient is 82 y.o. male who is being seen for routine issues of HTN, CAD and GERD.  Past Medical History:  Diagnosis Date  . Diabetes mellitus without complication (HCC)   . Hypertension   . Neuropathy (HCC)   . Status post dilation of esophageal narrowing   . Stented coronary artery   . Stroke (HCC)   . TIA (transient ischemic attack)     Past Surgical History:  Procedure Laterality Date  . CARDIAC CATHETERIZATION    . CORONARY ANGIOPLASTY    . esphageal dilation    . EYE SURGERY    . HERNIA REPAIR    . PROSTATE SURGERY        Medication List       Accurate as of 06/17/16  2:52 PM. Always use your most recent med list.          acetaminophen 500 MG tablet Commonly known as:  TYLENOL Take 500 mg by mouth at bedtime.   aspirin EC 81 MG tablet Take 1 tablet (81 mg total) by mouth daily.   atorvastatin 20 MG tablet Commonly known as:  LIPITOR Take 20 mg by mouth at bedtime.   glipiZIDE 10 MG 24 hr tablet Commonly known as:  GLUCOTROL XL Take 10 mg by mouth daily.   ipratropium 0.02 % nebulizer solution Commonly known as:  ATROVENT Take 0.5 mg by nebulization every 6 (six) hours as needed for wheezing or shortness of breath.   LORazepam 0.5 MG tablet Commonly known as:  ATIVAN Take 1 tablet (0.5 mg total) by mouth at bedtime as needed for anxiety.   Melatonin 10 MG Tabs Take 10 mg by mouth at bedtime.   omeprazole 20 MG capsule Commonly known as:  PRILOSEC Take 20 mg by mouth daily.   risperiDONE 0.25 MG tablet Commonly known as:  RISPERDAL Take 0.25 mg by mouth at bedtime.   sertraline 100 MG tablet Commonly known as:  ZOLOFT Take 100 mg by mouth daily. Anxiety/depression   tamsulosin 0.4 MG Caps capsule Commonly known as:  FLOMAX Take 0.4 mg by mouth daily after supper. 30 mins after supper   TYLENOL PM EXTRA STRENGTH PO Take 1 tablet by mouth at bedtime. insomnia   UNABLE TO FIND Med Name: Magic Cup give with lunch and dinner to support weight status       Meds ordered this encounter  Medications  . acetaminophen (TYLENOL) 500 MG tablet    Sig: Take 500 mg by mouth at bedtime.    Immunization History  Administered Date(s) Administered  . PPD Test 11/05/2015, 11/20/2015    Social History  Substance Use Topics  . Smoking status: Former Games developermoker  . Smokeless tobacco: Never Used  . Alcohol use 0.6 oz/week    1 Cans of beer per week     Comment: occasionallyee    Review of Systems  UTO 2/2 dementia    Vitals:   06/17/16 1355  BP: (!) 173/72  Pulse: 80  Resp: 18  Temp: 98.3 F (36.8 C)   Body mass index is 24.69 kg/m. Physical Exam  GENERAL APPEARANCE: Alert, non conversant, No acute distress  SKIN: No diaphoresis rash HEENT: Unremarkable RESPIRATORY: Breathing is even, unlabored. Lung sounds are clear   CARDIOVASCULAR: Heart RRR no murmurs, rubs or gallops. No peripheral edema  GASTROINTESTINAL: Abdomen is soft, non-tender, not distended w/ normal bowel sounds.  GENITOURINARY: Bladder non tender, not distended  MUSCULOSKELETAL: No abnormal joints or musculature NEUROLOGIC: Cranial nerves 2-12 grossly intact. Moves all extremities PSYCHIATRIC: dementia, no behavioral issues  Patient Active Problem List   Diagnosis Date Noted  . Depression 01/04/2016  . HCAP (healthcare-associated  pneumonia) 10/20/2015  . Cerebrovascular accident (CVA) due to stenosis of left middle cerebral artery (HCC) 08/27/2015  . Essential hypertension 08/27/2015  . Seizures (HCC) 08/27/2015  . Type 2 diabetes mellitus with circulatory disorder (HCC) 08/27/2015  . Dementia with behavioral disturbance 08/27/2015  . GERD (gastroesophageal reflux disease) 08/13/2015  . Vascular dementia with behavior disturbance 08/07/2015  . Dyslipidemia associated with type 2 diabetes mellitus (HCC) 08/07/2015  . 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 Subdural hematoma (HCC) 07/30/2015  . Dementia 06/23/2015  . BPH (benign prostatic hypertrophy) 06/23/2015  . CAD (coronary artery disease) s/p stent 06/18/2015  . Diabetes mellitus type 2, controlled (HCC) 06/18/2015  . Hyperlipidemia 06/18/2015  . Diabetes mellitus type 2 with neurological manifestations (HCC) 06/18/2015  . Cerebrovascular accident (CVA) (HCC)   . Intracranial carotid stenosis   . Stroke (HCC) 06/17/2015    CMP     Component Value Date/Time   NA 142 02/18/2016   K 3.6 02/18/2016   CL 101 08/04/2015 0525   CO2 29 08/04/2015 0525   GLUCOSE 149 (H) 08/04/2015 0525   BUN 15 02/18/2016   CREATININE 1.2 02/18/2016   CREATININE 1.35 (H) 08/04/2015 0525   CALCIUM 8.5 (L) 08/04/2015 0525   PROT 5.8 (L) 07/30/2015 1021   ALBUMIN 2.7 (L) 07/30/2015 1021   AST 18 07/30/2015 1021   ALT 14 (L) 07/30/2015 1021   ALKPHOS 97 07/30/2015 1021   BILITOT 0.5 07/30/2015 1021   GFRNONAA 46 (L) 08/04/2015 0525   GFRAA 53 (L) 08/04/2015 0525    Recent Labs  08/01/15 0719 08/03/15 0459 08/04/15 0525 02/18/16  NA 137 138 137 142  K 3.7 3.6 3.8 3.6  CL 103 102 101  --   CO2 26 29 29   --   GLUCOSE 149* 153* 149*  --   BUN 17 24* 22* 15  CREATININE 1.24 1.37* 1.35* 1.2  CALCIUM 8.7* 8.6* 8.5*  --     Recent Labs  07/30/15 1021  AST 18  ALT 14*  ALKPHOS 97  BILITOT 0.5  PROT 5.8*  ALBUMIN 2.7*    Recent Labs  07/30/15 1030   08/01/15 0719 08/03/15 0459 08/04/15 0525 02/18/16 04/30/16  WBC  --   < > 6.6 6.8 6.4 5.5 7.1  NEUTROABS 3.4  --   --   --   --   --   --   HGB  --   < > 11.3* 11.4* 10.9* 11.8* 12.9*  HCT  --   < > 32.7* 34.0* 32.4* 36* 40*  MCV  --   < > 85.2 86.3 85.5  --   --   PLT  --   < > 144* 162 167 154 175  < > =  values in this interval not displayed.  Recent Labs  01/15/16 04/30/16  CHOL 116 117  LDLCALC 64 59  TRIG 107 139   No results found for: MICROALBUR No results found for: TSH Lab Results  Component Value Date   HGBA1C 6.3 04/30/2016   Lab Results  Component Value Date   CHOL 117 04/30/2016   HDL 30 (A) 04/30/2016   LDLCALC 59 04/30/2016   TRIG 139 04/30/2016   CHOLHDL 5.0 06/18/2015    Significant Diagnostic Results in last 30 days:  No results found.  Assessment and Plan  No problem-specific Assessment & Plan notes found for this encounter.   Labs/tests ordered:    [name]. Lyn Hollingshead, MD

## 2016-07-03 ENCOUNTER — Encounter: Payer: Self-pay | Admitting: Internal Medicine

## 2016-07-03 NOTE — Assessment & Plan Note (Signed)
No reported episodes of reflux;plan to cont omeprazole 20 mg daily

## 2016-07-03 NOTE — Assessment & Plan Note (Signed)
Continues stable;plan to cont ASA, lipitor; pt not on plavix 2/2 SDH

## 2016-07-03 NOTE — Assessment & Plan Note (Signed)
Pt's BP is not controlled. Will start Norvasc 5 mg daily

## 2016-07-15 LAB — HEMOGLOBIN A1C: HEMOGLOBIN A1C: 6.3

## 2016-07-20 ENCOUNTER — Non-Acute Institutional Stay (SKILLED_NURSING_FACILITY): Payer: Medicare Other | Admitting: Internal Medicine

## 2016-07-20 ENCOUNTER — Encounter: Payer: Self-pay | Admitting: Internal Medicine

## 2016-07-20 DIAGNOSIS — F0151 Vascular dementia with behavioral disturbance: Secondary | ICD-10-CM

## 2016-07-20 DIAGNOSIS — I63512 Cerebral infarction due to unspecified occlusion or stenosis of left middle cerebral artery: Secondary | ICD-10-CM

## 2016-07-20 DIAGNOSIS — E1149 Type 2 diabetes mellitus with other diabetic neurological complication: Secondary | ICD-10-CM

## 2016-07-20 DIAGNOSIS — F01518 Vascular dementia, unspecified severity, with other behavioral disturbance: Secondary | ICD-10-CM

## 2016-07-20 NOTE — Progress Notes (Signed)
[facility] Room Number: 321P Place of Service:  SNF (31)  Parker Hunter. Parker Hollingshead, MD  Patient Care Team: Wilburn Mylar, MD as PCP - General [facility])  Extended Emergency Contact Information Primary Emergency Contact: Defeo,[name] Address: [address]          [place],  [address] Home Phone: [phone] Relation: None Secondary Emergency Contact: Herbert Seta [place] Mobile Phone: [phone] Relation: None    Allergies: Ibuprofen  Chief Complaint  Patient presents with  . Medical Management of Chronic Issues    Routine Visit    HPI: Patient is 82 y.o. male who is being seen routinely for s/p CVA, DM2 and dementia.  Past Medical History:  Diagnosis Date  . Diabetes mellitus without complication (HCC)   . Hypertension   . Neuropathy (HCC)   . Status post dilation of esophageal narrowing   . Stented coronary artery   . Stroke (HCC)   . TIA (transient ischemic attack)     Past Surgical History:  Procedure Laterality Date  . CARDIAC CATHETERIZATION    . CORONARY ANGIOPLASTY    . esphageal dilation    . EYE SURGERY    . HERNIA REPAIR    . PROSTATE SURGERY        Medication List       Accurate as of 07/20/16 11:59 PM. Always use your most recent med list.          acetaminophen 500 MG tablet Commonly known as:  TYLENOL Take 500 mg by mouth at bedtime.   aspirin EC 81 MG tablet Take 1 tablet (81 mg total) by mouth daily.   atorvastatin 20 MG tablet Commonly known as:  LIPITOR Take 20 mg by mouth at bedtime.   diphenhydrAMINE 25 MG tablet Commonly known as:  BENADRYL Take 25 mg by mouth every 6 (six) hours as needed for itching.   glipiZIDE 10 MG 24 hr tablet Commonly known as:  GLUCOTROL XL Take 10 mg by mouth daily.   ipratropium 0.02 % nebulizer solution Commonly known as:  ATROVENT Take 0.5 mg by nebulization every 6 (six) hours as needed for wheezing or shortness of breath.     LORazepam 0.5 MG tablet Commonly known as:  ATIVAN Take 1 tablet (0.5 mg total) by mouth at bedtime as needed for anxiety.   Melatonin 10 MG Tabs Take 10 mg by mouth at bedtime.   omeprazole 20 MG capsule Commonly known as:  PRILOSEC Take 20 mg by mouth daily.   risperiDONE 0.25 MG tablet Commonly known as:  RISPERDAL Take 0.25 mg by mouth at bedtime.   sertraline 100 MG tablet Commonly known as:  ZOLOFT Take 100 mg by mouth daily. Anxiety/depression   tamsulosin 0.4 MG Caps capsule Commonly known as:  FLOMAX Take 0.4 mg by mouth daily after supper. 30 mins after supper   TYLENOL PM EXTRA STRENGTH PO Take 1 tablet by mouth at bedtime. insomnia   UNABLE TO FIND Med Name: Magic Cup give with lunch and dinner to support weight status       Meds ordered this encounter  Medications  . DISCONTD: diphenhydrAMINE (BENADRYL) 25 MG tablet    Sig: Take 25 mg by mouth every 6 (six) hours as needed for itching.    Immunization History  Administered Date(s) Administered  . Influenza-Unspecified 06/11/2016  . PPD Test 11/05/2015, 11/20/2015    Social History  Substance Use Topics  . Smoking status: Former  Smoker  . Smokeless tobacco: Never Used  . Alcohol use 0.6 oz/week    1 Cans of beer per week     Comment: occasionallyee    Review of Systems  UTO 2/2 dementia    Vitals:   07/20/16 1033  BP: (!) 173/72  Pulse: 66  Resp: 18  Temp: 98.3 F (36.8 C)   Body mass index is 22.66 kg/m. Physical Exam  GENERAL APPEARANCE: Alert, mod conversant, No acute distress  SKIN: No diaphoresis rash HEENT: Unremarkable RESPIRATORY: Breathing is even, unlabored. Lung sounds are clear   CARDIOVASCULAR: Heart RRR no murmurs, rubs or gallops. No peripheral edema  GASTROINTESTINAL: Abdomen is soft, non-tender, not distended w/ normal bowel sounds.  GENITOURINARY: Bladder non tender, not distended  MUSCULOSKELETAL: No abnormal joints or musculature NEUROLOGIC: Cranial nerves  2-12 grossly intact. Moves all extremities PSYCHIATRIC: dementia, no behavioral issues  Patient Active Problem List   Diagnosis Date Noted  . Depression 01/04/2016  . HCAP (healthcare-associated pneumonia) 10/20/2015  . Cerebrovascular accident (CVA) due to stenosis of left middle cerebral artery (HCC) 08/27/2015  . Essential hypertension 08/27/2015  . Seizures (HCC) 08/27/2015  . Type 2 diabetes mellitus with circulatory disorder (HCC) 08/27/2015  . Dementia with behavioral disturbance 08/27/2015  . GERD (gastroesophageal reflux disease) 08/13/2015  . Vascular dementia with behavior disturbance 08/07/2015  . Dyslipidemia associated with type 2 diabetes mellitus (HCC) 08/07/2015  . 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 Subdural hematoma (HCC) 07/30/2015  . Dementia 06/23/2015  . BPH (benign prostatic hypertrophy) 06/23/2015  . CAD (coronary artery disease) s/p stent 06/18/2015  . Diabetes mellitus type 2, controlled (HCC) 06/18/2015  . Hyperlipidemia 06/18/2015  . Diabetes mellitus type 2 with neurological manifestations (HCC) 06/18/2015  . Cerebrovascular accident (CVA) (HCC)   . Intracranial carotid stenosis   . Stroke (HCC) 06/17/2015    CMP     Component Value Date/Time   NA 143 07/21/2016   K 3.7 07/21/2016   CL 101 08/04/2015 0525   CO2 29 08/04/2015 0525   GLUCOSE 149 (H) 08/04/2015 0525   BUN 20 07/21/2016   CREATININE 1.1 07/21/2016   CREATININE 1.35 (H) 08/04/2015 0525   CALCIUM 8.5 (L) 08/04/2015 0525   PROT 5.8 (L) 07/30/2015 1021   ALBUMIN 2.7 (L) 07/30/2015 1021   AST 16 07/21/2016   ALT 10 07/21/2016   ALKPHOS 116 07/21/2016   BILITOT 0.5 07/30/2015 1021   GFRNONAA 46 (L) 08/04/2015 0525   GFRAA 53 (L) 08/04/2015 0525    Recent Labs  08/01/15 0719 08/03/15 0459 08/04/15 0525 02/18/16 07/21/16  NA 137 138 137 142 143  K 3.7 3.6 3.8 3.6 3.7  CL 103 102 101  --   --   CO2 26 29 29   --   --   GLUCOSE 149* 153* 149*  --   --   BUN 17 24* 22* 15 20    CREATININE 1.24 1.37* 1.35* 1.2 1.1  CALCIUM 8.7* 8.6* 8.5*  --   --     Recent Labs  07/30/15 1021 07/21/16  AST 18 16  ALT 14* 10  ALKPHOS 97 116  BILITOT 0.5  --   PROT 5.8*  --   ALBUMIN 2.7*  --     Recent Labs  07/30/15 1030  08/01/15 0719 08/03/15 0459 08/04/15 0525  04/30/16 06/15/16 07/21/16  WBC  --   < > 6.6 6.8 6.4  < > 7.1 8.1 8.2  NEUTROABS 3.4  --   --   --   --   --   --   --   --  HGB  --   < > 11.3* 11.4* 10.9*  < > 12.9* 13.3* 13.1*  HCT  --   < > 32.7* 34.0* 32.4*  < > 40* 40* 40*  MCV  --   < > 85.2 86.3 85.5  --   --   --   --   PLT  --   < > 144* 162 167  < > 175 166 173  < > = values in this interval not displayed.  Recent Labs  04/30/16 06/15/16 07/21/16  CHOL 117 121 130  LDLCALC 59 41 62  TRIG 139 256* 189*   No results found for: [facility] Lab Results  Component Value Date   TSH 2.32 07/21/2016   Lab Results  Component Value Date   HGBA1C 6.3 07/15/2016   Lab Results  Component Value Date   CHOL 130 07/21/2016   HDL 30 (A) 07/21/2016   LDLCALC 62 07/21/2016   TRIG 189 (A) 07/21/2016   CHOLHDL 5.0 06/18/2015    Significant Diagnostic Results in last 30 days:  No results found.  Assessment and Plan  Cerebrovascular accident (CVA) due to stenosis of left middle cerebral artery (HCC) Goal is control of risk factors - BP has not been as well controled, recently started on norvasc,, pt ir on a statin, and pt is prophylaxed with ASA 81 mg only 2/2 prior SDH  Diabetes mellitus type 2 with neurological manifestations (HCC) In 07/2016 a1c 6.2, good control with dlucatrol XL 10 mg daily;pt on statin, and microalbumin ordered  Vascular dementia with behavior disturbance Chromic and stable with slow decline; pt on zoloft and reisperdal for behavoirs     Parker Magowan D. Parker HollingsheadAlexander, MD

## 2016-07-21 ENCOUNTER — Encounter: Payer: Self-pay | Admitting: Internal Medicine

## 2016-07-21 ENCOUNTER — Non-Acute Institutional Stay (SKILLED_NURSING_FACILITY): Payer: Medicare Other | Admitting: Internal Medicine

## 2016-07-21 DIAGNOSIS — R21 Rash and other nonspecific skin eruption: Secondary | ICD-10-CM

## 2016-07-21 DIAGNOSIS — Z9109 Other allergy status, other than to drugs and biological substances: Secondary | ICD-10-CM

## 2016-07-21 LAB — VITAMIN D 25 HYDROXY (VIT D DEFICIENCY, FRACTURES): Vit D, 25-Hydroxy: 22.71

## 2016-07-21 LAB — CBC AND DIFFERENTIAL
HEMATOCRIT: 40 % — AB (ref 41–53)
Hemoglobin: 13.1 g/dL — AB (ref 13.5–17.5)
PLATELETS: 173 10*3/uL (ref 150–399)
WBC: 8.2 10^3/mL

## 2016-07-21 LAB — LIPID PANEL
CHOLESTEROL: 130 mg/dL (ref 0–200)
HDL: 30 mg/dL — AB (ref 35–70)
LDL CALC: 62 mg/dL
TRIGLYCERIDES: 189 mg/dL — AB (ref 40–160)

## 2016-07-21 LAB — BASIC METABOLIC PANEL
BUN: 20 mg/dL (ref 4–21)
CREATININE: 1.1 mg/dL (ref 0.6–1.3)
Glucose: 207 mg/dL
Potassium: 3.7 mmol/L (ref 3.4–5.3)
SODIUM: 143 mmol/L (ref 137–147)

## 2016-07-21 LAB — TSH: TSH: 2.32 u[IU]/mL (ref 0.41–5.90)

## 2016-07-21 LAB — HEPATIC FUNCTION PANEL
ALT: 10 U/L (ref 10–40)
AST: 16 U/L (ref 14–40)
Alkaline Phosphatase: 116 U/L (ref 25–125)
BILIRUBIN, TOTAL: 0.3 mg/dL

## 2016-07-21 LAB — HEMOGLOBIN A1C: HEMOGLOBIN A1C: 6.6

## 2016-07-21 NOTE — Progress Notes (Signed)
[facility] Room Number: 321P Place of Service:  SNF (31)  Randon Goldsmithnne D. Lyn HollingsheadAlexander, MD  Patient Care Team: Wilburn MylarSamuel S Kelly, MD as PCP - General [facility](Family Medicine)  Extended Emergency Contact Information Primary Emergency Contact: Dadamo,[name] Address: [address]          [place],  [id number] Home Phone: [phone] Relation: None Secondary Emergency Contact: Herbert SetaYard,Robin  [place] Mobile Phone: [phone] Relation: None    Allergies: Ibuprofen  Chief Complaint  Patient presents with  . Acute Visit    Acute    HPI: Patient is 82 y.o. male who nursing has asked me to see for a rash. Benadryl was called in the patient is still itching with rash. Rash coincided with pt's wife no longer washing his clothes and the facility starting to wash his laundry. Pt apparently is not asking for the benadryl.  Past Medical History:  Diagnosis Date  . Diabetes mellitus without complication (HCC)   . Hypertension   . Neuropathy (HCC)   . Status post dilation of esophageal narrowing   . Stented coronary artery   . Stroke (HCC)   . TIA (transient ischemic attack)     Past Surgical History:  Procedure Laterality Date  . CARDIAC CATHETERIZATION    . CORONARY ANGIOPLASTY    . esphageal dilation    . EYE SURGERY    . HERNIA REPAIR    . PROSTATE SURGERY        Medication List       Accurate as of 07/21/16 11:59 PM. Always use your most recent med list.          acetaminophen 500 MG tablet Commonly known as:  TYLENOL Take 500 mg by mouth at bedtime.   aspirin EC 81 MG tablet Take 1 tablet (81 mg total) by mouth daily.   atorvastatin 20 MG tablet Commonly known as:  LIPITOR Take 20 mg by mouth at bedtime.   diphenhydrAMINE 25 MG tablet Commonly known as:  BENADRYL Take 25 mg by mouth every 6 (six) hours as needed for itching.   glipiZIDE 10 MG 24 hr tablet Commonly known as:  GLUCOTROL XL Take 10 mg by mouth daily.     ipratropium 0.02 % nebulizer solution Commonly known as:  ATROVENT Take 0.5 mg by nebulization every 6 (six) hours as needed for wheezing or shortness of breath.   LORazepam 0.5 MG tablet Commonly known as:  ATIVAN Take 1 tablet (0.5 mg total) by mouth at bedtime as needed for anxiety.   Melatonin 10 MG Tabs Take 10 mg by mouth at bedtime.   omeprazole 20 MG capsule Commonly known as:  PRILOSEC Take 20 mg by mouth daily.   risperiDONE 0.25 MG tablet Commonly known as:  RISPERDAL Take 0.25 mg by mouth at bedtime.   sertraline 100 MG tablet Commonly known as:  ZOLOFT Take 100 mg by mouth daily. Anxiety/depression   tamsulosin 0.4 MG Caps capsule Commonly known as:  FLOMAX Take 0.4 mg by mouth daily after supper. 30 mins after supper   TYLENOL PM EXTRA STRENGTH PO Take 1 tablet by mouth at bedtime. insomnia   UNABLE TO FIND Med Name: Magic Cup give with lunch and dinner to support weight status   Vitamin D3 50000 units Tabs Take 1 tablet by mouth once a week. For 12 weeks       No orders of the defined types were placed in this encounter.   Immunization  History  Administered Date(s) Administered  . Influenza-Unspecified 06/11/2016  . PPD Test 11/05/2015, 11/20/2015    Social History  Substance Use Topics  . Smoking status: Former Games developer  . Smokeless tobacco: Never Used  . Alcohol use 0.6 oz/week    1 Cans of beer per week     Comment: occasionallyee    Review of Systems  UTO 2/2 dementia; Nursing as per HPI    Vitals:   07/21/16 1256  BP: (!) 173/72  Pulse: 66  Resp: 18  Temp: 98.3 F (36.8 C)   Body mass index is 22.6 kg/m. Physical Exam  GENERAL APPEARANCE: Alert, mod conversant, No acute distress  SKIN: maculopapular slt scaly rash on trunk and arms HEENT: Unremarkable RESPIRATORY: Breathing is even, unlabored. Lung sounds are clear   CARDIOVASCULAR: Heart RRR no murmurs, rubs or gallops. No peripheral edema  GASTROINTESTINAL: Abdomen is  soft, non-tender, not distended w/ normal bowel sounds.  GENITOURINARY: Bladder non tender, not distended  MUSCULOSKELETAL: No abnormal joints or musculature NEUROLOGIC: Cranial nerves 2-12 grossly intact. Moves all extremities PSYCHIATRIC: dementia, no behavioral issues  Patient Active Problem List   Diagnosis Date Noted  . Depression 01/04/2016  . HCAP (healthcare-associated pneumonia) 10/20/2015  . Cerebrovascular accident (CVA) due to stenosis of left middle cerebral artery (HCC) 08/27/2015  . Essential hypertension 08/27/2015  . Seizures (HCC) 08/27/2015  . Type 2 diabetes mellitus with circulatory disorder (HCC) 08/27/2015  . Dementia with behavioral disturbance 08/27/2015  . GERD (gastroesophageal reflux disease) 08/13/2015  . Vascular dementia with behavior disturbance 08/07/2015  . Dyslipidemia associated with type 2 diabetes mellitus (HCC) 08/07/2015  . 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 Subdural hematoma (HCC) 07/30/2015  . Dementia 06/23/2015  . BPH (benign prostatic hypertrophy) 06/23/2015  . CAD (coronary artery disease) s/p stent 06/18/2015  . Diabetes mellitus type 2, controlled (HCC) 06/18/2015  . Hyperlipidemia 06/18/2015  . Diabetes mellitus type 2 with neurological manifestations (HCC) 06/18/2015  . Cerebrovascular accident (CVA) (HCC)   . Intracranial carotid stenosis   . Stroke (HCC) 06/17/2015    CMP     Component Value Date/Time   NA 143 07/21/2016   K 3.7 07/21/2016   CL 101 08/04/2015 0525   CO2 29 08/04/2015 0525   GLUCOSE 149 (H) 08/04/2015 0525   BUN 20 07/21/2016   CREATININE 1.1 07/21/2016   CREATININE 1.35 (H) 08/04/2015 0525   CALCIUM 8.5 (L) 08/04/2015 0525   PROT 5.8 (L) 07/30/2015 1021   ALBUMIN 2.7 (L) 07/30/2015 1021   AST 16 07/21/2016   ALT 10 07/21/2016   ALKPHOS 116 07/21/2016   BILITOT 0.5 07/30/2015 1021   GFRNONAA 46 (L) 08/04/2015 0525   GFRAA 53 (L) 08/04/2015 0525    Recent Labs  08/01/15 0719 08/03/15 0459  08/04/15 0525 02/18/16 07/21/16  NA 137 138 137 142 143  K 3.7 3.6 3.8 3.6 3.7  CL 103 102 101  --   --   CO2 26 29 29   --   --   GLUCOSE 149* 153* 149*  --   --   BUN 17 24* 22* 15 20  CREATININE 1.24 1.37* 1.35* 1.2 1.1  CALCIUM 8.7* 8.6* 8.5*  --   --     Recent Labs  07/30/15 1021 07/21/16  AST 18 16  ALT 14* 10  ALKPHOS 97 116  BILITOT 0.5  --   PROT 5.8*  --   ALBUMIN 2.7*  --     Recent Labs  07/30/15 1030  08/01/15  [id number] 08/03/15 0459 08/04/15 0525  04/30/16 06/15/16 07/21/16  WBC  --   < > 6.6 6.8 6.4  < > 7.1 8.1 8.2  NEUTROABS 3.4  --   --   --   --   --   --   --   --   HGB  --   < > 11.3* 11.4* 10.9*  < > 12.9* 13.3* 13.1*  HCT  --   < > 32.7* 34.0* 32.4*  < > 40* 40* 40*  MCV  --   < > 85.2 86.3 85.5  --   --   --   --   PLT  --   < > 144* 162 167  < > 175 166 173  < > = values in this interval not displayed.  Recent Labs  04/30/16 06/15/16 07/21/16  CHOL 117 121 130  LDLCALC 59 41 62  TRIG 139 256* 189*   No results found for: [facility] Lab Results  Component Value Date   TSH 2.32 07/21/2016   Lab Results  Component Value Date   HGBA1C 6.3 07/15/2016   Lab Results  Component Value Date   CHOL 130 07/21/2016   HDL 30 (A) 07/21/2016   LDLCALC 62 07/21/2016   TRIG 189 (A) 07/21/2016   CHOLHDL 5.0 06/18/2015    Significant Diagnostic Results in last 30 days:  No results found.  Assessment and Plan  ALLERGIC RASH- PROBABLY 2/2 to laundry detergent SNF uses;only cure is wife starting his laundry again; she worries about this-we discussed maybe she could provide 2 weeks worth of cotton undershirts; will try this; also going to change benadryl to vistaril 25 mg TID scheduled    Time spent > 25 min Kytzia Gienger D. Lyn HollingsheadAlexander, MD

## 2016-07-22 ENCOUNTER — Encounter: Payer: Self-pay | Admitting: Internal Medicine

## 2016-07-22 ENCOUNTER — Non-Acute Institutional Stay (SKILLED_NURSING_FACILITY): Payer: Medicare Other | Admitting: Internal Medicine

## 2016-07-22 DIAGNOSIS — R972 Elevated prostate specific antigen [PSA]: Secondary | ICD-10-CM

## 2016-07-22 DIAGNOSIS — E559 Vitamin D deficiency, unspecified: Secondary | ICD-10-CM | POA: Diagnosis not present

## 2016-07-22 NOTE — Progress Notes (Signed)
[facility] Room Number: 321P Place of Service:  SNF (31)  [name]. Lyn Hollingshead, MD  Patient Care Team: Wilburn Mylar, MD as PCP - General [facility] Medicine)  Extended Emergency Contact Information Primary Emergency Contact: Corlett,[name] Address: [address]          [place],  [address] Home Phone: [phone] Relation: None Secondary Emergency Contact: Herbert Seta [place] Mobile Phone: [phone] Relation: None    Allergies: Ibuprofen  Chief Complaint  Patient presents with  . Acute Visit    Acute    HPI: Patient is 82 y.o. male who is being seen today acutely because his labs returned with 2 abnormalities. Pt's Vit D level is 22.7. More importantly pt's PSA is > 100 which is the highest I've ever see. Pt has not had c/o pain, like bone pain and pt has not had urinary sx. Pt's Michaele Offer is here so I can speak to her about this in person.Per wife, because this is not in his records pt pt has had a TURP in the past for his BPH without LUTS.   Past Medical History:  Diagnosis Date  . Diabetes mellitus without complication (HCC)   . Hypertension   . Neuropathy (HCC)   . Status post dilation of esophageal narrowing   . Stented coronary artery   . Stroke (HCC)   . TIA (transient ischemic attack)     Past Surgical History:  Procedure Laterality Date  . CARDIAC CATHETERIZATION    . CORONARY ANGIOPLASTY    . esphageal dilation    . EYE SURGERY    . HERNIA REPAIR    . PROSTATE SURGERY        Medication List       Accurate as of 07/22/16 11:59 PM. Always use your most recent med list.          acetaminophen 500 MG tablet Commonly known as:  TYLENOL Take 500 mg by mouth at bedtime.   aspirin EC 81 MG tablet Take 1 tablet (81 mg total) by mouth daily.   atorvastatin 20 MG tablet Commonly known as:  LIPITOR Take 20 mg by mouth at bedtime.   glipiZIDE 10 MG 24 hr tablet Commonly known as:  GLUCOTROL XL Take  10 mg by mouth daily.   ipratropium 0.02 % nebulizer solution Commonly known as:  ATROVENT Take 0.5 mg by nebulization every 6 (six) hours as needed for wheezing or shortness of breath.   LORazepam 0.5 MG tablet Commonly known as:  ATIVAN Take 1 tablet (0.5 mg total) by mouth at bedtime as needed for anxiety.   Melatonin 10 MG Tabs Take 10 mg by mouth at bedtime.   omeprazole 20 MG capsule Commonly known as:  PRILOSEC Take 20 mg by mouth daily.   risperiDONE 0.25 MG tablet Commonly known as:  RISPERDAL Take 0.25 mg by mouth at bedtime.   sertraline 100 MG tablet Commonly known as:  ZOLOFT Take 100 mg by mouth daily. Anxiety/depression   tamsulosin 0.4 MG Caps capsule Commonly known as:  FLOMAX Take 0.4 mg by mouth daily after supper. 30 mins after supper   TYLENOL PM EXTRA STRENGTH PO Take 1 tablet by mouth at bedtime. insomnia   UNABLE TO FIND Med Name: Magic Cup give with lunch and dinner to support weight status   Vitamin D3 50000 units Tabs Take 1 tablet by mouth once a week. For 12 weeks  Meds ordered this encounter  Medications  . Cholecalciferol (VITAMIN D3) 50000 units TABS    Sig: Take 1 tablet by mouth once a week. For 12 weeks    Immunization History  Administered Date(s) Administered  . Influenza-Unspecified 06/11/2016  . PPD Test 11/05/2015, 11/20/2015    Social History  Substance Use Topics  . Smoking status: Former Games developermoker  . Smokeless tobacco: Never Used  . Alcohol use 0.6 oz/week    1 Cans of beer per week     Comment: occasionallyee    Review of Systems  UTP 2/2 dementia; per wife and nurse no other concerns except rash,    Vitals:   07/22/16 1510  BP: (!) 167/88  Pulse: 67  Resp: 20  Temp: 97.4 F (36.3 C)   Body mass index is 22.66 kg/m. Physical Exam  GENERAL APPEARANCE: Alert, min conversant, No acute distress  SKIN: maculopapular slt scaley rash on trunk and some on arms HEENT: Unremarkable RESPIRATORY:  Breathing is even, unlabored. Lung sounds are clear   CARDIOVASCULAR: Heart RRR no murmurs, rubs or gallops. No peripheral edema  GASTROINTESTINAL: Abdomen is soft, non-tender, not distended w/ normal bowel sounds.  GENITOURINARY: Bladder non tender, not distended  MUSCULOSKELETAL: No abnormal joints or musculature NEUROLOGIC: Cranial nerves 2-12 grossly intact. Moves all extremities PSYCHIATRIC: dementia, no behavioral issues  Patient Active Problem List   Diagnosis Date Noted  . Depression 01/04/2016  . HCAP (healthcare-associated pneumonia) 10/20/2015  . Cerebrovascular accident (CVA) due to stenosis of left middle cerebral artery (HCC) 08/27/2015  . Essential hypertension 08/27/2015  . Seizures (HCC) 08/27/2015  . Type 2 diabetes mellitus with circulatory disorder (HCC) 08/27/2015  . Dementia with behavioral disturbance 08/27/2015  . GERD (gastroesophageal reflux disease) 08/13/2015  . Vascular dementia with behavior disturbance 08/07/2015  . Dyslipidemia associated with type 2 diabetes mellitus (HCC) 08/07/2015  . 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 Subdural hematoma (HCC) 07/30/2015  . Dementia 06/23/2015  . BPH (benign prostatic hypertrophy) 06/23/2015  . CAD (coronary artery disease) s/p stent 06/18/2015  . Diabetes mellitus type 2, controlled (HCC) 06/18/2015  . Hyperlipidemia 06/18/2015  . Diabetes mellitus type 2 with neurological manifestations (HCC) 06/18/2015  . Cerebrovascular accident (CVA) (HCC)   . Intracranial carotid stenosis   . Stroke (HCC) 06/17/2015    CMP     Component Value Date/Time   NA 143 07/21/2016   K 3.7 07/21/2016   CL 101 08/04/2015 0525   CO2 29 08/04/2015 0525   GLUCOSE 149 (H) 08/04/2015 0525   BUN 20 07/21/2016   CREATININE 1.1 07/21/2016   CREATININE 1.35 (H) 08/04/2015 0525   CALCIUM 8.5 (L) 08/04/2015 0525   PROT 5.8 (L) 07/30/2015 1021   ALBUMIN 2.7 (L) 07/30/2015 1021   AST 16 07/21/2016   ALT 10 07/21/2016   ALKPHOS 116  07/21/2016   BILITOT 0.5 07/30/2015 1021   GFRNONAA 46 (L) 08/04/2015 0525   GFRAA 53 (L) 08/04/2015 0525    Recent Labs  08/01/15 0719 08/03/15 0459 08/04/15 0525 02/18/16 07/21/16  NA 137 138 137 142 143  K 3.7 3.6 3.8 3.6 3.7  CL 103 102 101  --   --   CO2 26 29 29   --   --   GLUCOSE 149* 153* 149*  --   --   BUN 17 24* 22* 15 20  CREATININE 1.24 1.37* 1.35* 1.2 1.1  CALCIUM 8.7* 8.6* 8.5*  --   --     Recent Labs  07/30/15 1021 07/21/16  AST 18 16  ALT 14* 10  ALKPHOS 97 116  BILITOT 0.5  --   PROT 5.8*  --   ALBUMIN 2.7*  --     Recent Labs  07/30/15 1030  08/01/15 0719 08/03/15 0459 08/04/15 0525  04/30/16 06/15/16 07/21/16  WBC  --   < > 6.6 6.8 6.4  < > 7.1 8.1 8.2  NEUTROABS 3.4  --   --   --   --   --   --   --   --   HGB  --   < > 11.3* 11.4* 10.9*  < > 12.9* 13.3* 13.1*  HCT  --   < > 32.7* 34.0* 32.4*  < > 40* 40* 40*  MCV  --   < > 85.2 86.3 85.5  --   --   --   --   PLT  --   < > 144* 162 167  < > 175 166 173  < > = values in this interval not displayed.  Recent Labs  04/30/16 06/15/16 07/21/16  CHOL 117 121 130  LDLCALC 59 41 62  TRIG 139 256* 189*   No results found for: [facility] Lab Results  Component Value Date   TSH 2.32 07/21/2016   Lab Results  Component Value Date   HGBA1C 6.3 07/15/2016   Lab Results  Component Value Date   CHOL 130 07/21/2016   HDL 30 (A) 07/21/2016   LDLCALC 62 07/21/2016   TRIG 189 (A) 07/21/2016   CHOLHDL 5.0 06/18/2015    Significant Diagnostic Results in last 30 days:  No results found.  Assessment and   VERY ELEVATED PSA -  This high a PSA concern is for metastatic dx bur have no symptoms which is good. I told the wife that I would like to speak to his Urologist Dr Cinda QuestAskew first before an appt is set up. I told her and she agreed that no major intervention but if only a medication was involved that would be worthwhile. I called Dr Andreas OhmAskews office and left a message, they did not call back; I will  call them again.  VITAMIN D DEFICIENCY - I have written for pt to start 50,000 u weekly for 12 weeks with repeat Vit D level.    Time spent > 35 min Anne D. Lyn HollingsheadAlexander, MD

## 2016-07-23 ENCOUNTER — Encounter: Payer: Self-pay | Admitting: Internal Medicine

## 2016-07-24 ENCOUNTER — Encounter: Payer: Self-pay | Admitting: Internal Medicine

## 2016-07-24 NOTE — Assessment & Plan Note (Signed)
In 07/2016 a1c 6.2, good control with dlucatrol XL 10 mg daily;pt on statin, and microalbumin ordered

## 2016-07-24 NOTE — Assessment & Plan Note (Signed)
Chromic and stable with slow decline; pt on zoloft and reisperdal for behavoirs

## 2016-07-24 NOTE — Assessment & Plan Note (Signed)
Goal is control of risk factors - BP has not been as well controled, recently started on norvasc,, pt ir on a statin, and pt is prophylaxed with ASA 81 mg only 2/2 prior SDH

## 2016-07-27 ENCOUNTER — Non-Acute Institutional Stay (SKILLED_NURSING_FACILITY): Payer: Medicare Other | Admitting: Internal Medicine

## 2016-07-27 ENCOUNTER — Encounter: Payer: Self-pay | Admitting: Internal Medicine

## 2016-07-27 DIAGNOSIS — R972 Elevated prostate specific antigen [PSA]: Secondary | ICD-10-CM | POA: Diagnosis not present

## 2016-07-27 NOTE — Progress Notes (Signed)
[facility] Room Number: 321P Place of Service:  SNF (31)  Randon Goldsmithnne D. Lyn HollingsheadAlexander, MD  Patient Care Team: Wilburn MylarSamuel S Kelly, MD as PCP - General [facility](Family Medicine)  Extended Emergency Contact Information Primary Emergency Contact: Pollino,[name] Address: [address]          [place],  [id number] Home Phone: [phone] Relation: None Secondary Emergency Contact: Herbert SetaYard,Robin  [place] Mobile Phone: [phone] Relation: None    Allergies: Ibuprofen  Chief Complaint  Patient presents with  . Acute Visit    Acute    HPI: Patient is 82 y.o. male with dementia, CAD, DM2, s/p stroke who had a PSA of > 100 on a screening test. I had called pt's urologist Dr Hinton RaoAndy Askew to discuss the proper approach to treatment. I had already spoken to pt's wife who desires a very conservative route.  Past Medical History:  Diagnosis Date  . Diabetes mellitus without complication (HCC)   . Hypertension   . Neuropathy (HCC)   . Status post dilation of esophageal narrowing   . Stented coronary artery   . Stroke (HCC)   . TIA (transient ischemic attack)     Past Surgical History:  Procedure Laterality Date  . CARDIAC CATHETERIZATION    . CORONARY ANGIOPLASTY    . esphageal dilation    . EYE SURGERY    . HERNIA REPAIR    . PROSTATE SURGERY        Medication List       Accurate as of 07/27/16 11:59 PM. Always use your most recent med list.          acetaminophen 500 MG tablet Commonly known as:  TYLENOL Take 500 mg by mouth at bedtime.   amLODipine 5 MG tablet Commonly known as:  NORVASC Take 5 mg by mouth daily.   aspirin EC 81 MG tablet Take 1 tablet (81 mg total) by mouth daily.   atorvastatin 20 MG tablet Commonly known as:  LIPITOR Take 20 mg by mouth at bedtime.   glipiZIDE 10 MG 24 hr tablet Commonly known as:  GLUCOTROL XL Take 10 mg by mouth daily.   ipratropium 0.02 % nebulizer solution Commonly known as:   ATROVENT Take 0.5 mg by nebulization every 6 (six) hours as needed for wheezing or shortness of breath.   LORazepam 0.5 MG tablet Commonly known as:  ATIVAN Take 1 tablet (0.5 mg total) by mouth at bedtime as needed for anxiety.   Melatonin 10 MG Tabs Take 10 mg by mouth at bedtime.   omeprazole 20 MG capsule Commonly known as:  PRILOSEC Take 20 mg by mouth daily.   risperiDONE 0.25 MG tablet Commonly known as:  RISPERDAL Take 0.25 mg by mouth at bedtime.   sertraline 100 MG tablet Commonly known as:  ZOLOFT Take 100 mg by mouth daily. Anxiety/depression   tamsulosin 0.4 MG Caps capsule Commonly known as:  FLOMAX Take 0.4 mg by mouth daily after supper. 30 mins after supper   TYLENOL PM EXTRA STRENGTH PO Take 1 tablet by mouth at bedtime. insomnia   UNABLE TO FIND Med Name: Magic Cup give with lunch and dinner to support weight status   Vitamin D3 50000 units Tabs Take 1 tablet by mouth once a week. For 12 weeks       Meds ordered this encounter  Medications  . amLODipine (NORVASC) 5 MG tablet    Sig: Take 5 mg by mouth daily.  Immunization History  Administered Date(s) Administered  . Influenza-Unspecified 06/11/2016  . PPD Test 11/05/2015, 11/20/2015    Social History  Substance Use Topics  . Smoking status: Former Games developermoker  . Smokeless tobacco: Never Used  . Alcohol use 0.6 oz/week    1 Cans of beer per week     Comment: occasionallyee    Review of Systems  UTO 2/2 dementia; nursing without concerns    Vitals:   07/27/16 1517  BP: (!) 173/72  Pulse: 81  Resp: 19  Temp: 98.3 F (36.8 C)   Body mass index is 22.82 kg/m. Physical Exam  GENERAL APPEARANCE: Alert, min conversant, No acute distress  SKIN: No diaphoresis rash HEENT: Unremarkable RESPIRATORY: Breathing is even, unlabored. Lung sounds are clear   CARDIOVASCULAR: Heart RRR no murmurs, rubs or gallops. No peripheral edema  GASTROINTESTINAL: Abdomen is soft, non-tender, not  distended w/ normal bowel sounds.  GENITOURINARY: Bladder non tender, not distended  MUSCULOSKELETAL: No abnormal joints or musculature NEUROLOGIC: Cranial nerves 2-12 grossly intact. Moves all extremities PSYCHIATRIC: Mood and affect appropriate to situation, no behavioral issues  Patient Active Problem List   Diagnosis Date Noted  . Depression 01/04/2016  . HCAP (healthcare-associated pneumonia) 10/20/2015  . Cerebrovascular accident (CVA) due to stenosis of left middle cerebral artery (HCC) 08/27/2015  . Essential hypertension 08/27/2015  . Seizures (HCC) 08/27/2015  . Type 2 diabetes mellitus with circulatory disorder (HCC) 08/27/2015  . Dementia with behavioral disturbance 08/27/2015  . GERD (gastroesophageal reflux disease) 08/13/2015  . Vascular dementia with behavior disturbance 08/07/2015  . Dyslipidemia associated with type 2 diabetes mellitus (HCC) 08/07/2015  . 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 Subdural hematoma (HCC) 07/30/2015  . Dementia 06/23/2015  . BPH (benign prostatic hypertrophy) 06/23/2015  . CAD (coronary artery disease) s/p stent 06/18/2015  . Diabetes mellitus type 2, controlled (HCC) 06/18/2015  . Hyperlipidemia 06/18/2015  . Diabetes mellitus type 2 with neurological manifestations (HCC) 06/18/2015  . Cerebrovascular accident (CVA) (HCC)   . Intracranial carotid stenosis   . Stroke (HCC) 06/17/2015    CMP     Component Value Date/Time   NA 143 07/21/2016   K 3.7 07/21/2016   CL 101 08/04/2015 0525   CO2 29 08/04/2015 0525   GLUCOSE 149 (H) 08/04/2015 0525   BUN 20 07/21/2016   CREATININE 1.1 07/21/2016   CREATININE 1.35 (H) 08/04/2015 0525   CALCIUM 8.5 (L) 08/04/2015 0525   PROT 5.8 (L) 07/30/2015 1021   ALBUMIN 2.7 (L) 07/30/2015 1021   AST 16 07/21/2016   ALT 10 07/21/2016   ALKPHOS 116 07/21/2016   BILITOT 0.5 07/30/2015 1021   GFRNONAA 46 (L) 08/04/2015 0525   GFRAA 53 (L) 08/04/2015 0525    Recent Labs  08/01/15 0719  08/03/15 0459 08/04/15 0525 02/18/16 07/21/16  NA 137 138 137 142 143  K 3.7 3.6 3.8 3.6 3.7  CL 103 102 101  --   --   CO2 26 29 29   --   --   GLUCOSE 149* 153* 149*  --   --   BUN 17 24* 22* 15 20  CREATININE 1.24 1.37* 1.35* 1.2 1.1  CALCIUM 8.7* 8.6* 8.5*  --   --     Recent Labs  07/30/15 1021 07/21/16  AST 18 16  ALT 14* 10  ALKPHOS 97 116  BILITOT 0.5  --   PROT 5.8*  --   ALBUMIN 2.7*  --     Recent Labs  07/30/15 1030  08/01/15  1610 08/03/15 0459 08/04/15 0525  04/30/16 06/15/16 07/21/16  WBC  --   < > 6.6 6.8 6.4  < > 7.1 8.1 8.2  NEUTROABS 3.4  --   --   --   --   --   --   --   --   HGB  --   < > 11.3* 11.4* 10.9*  < > 12.9* 13.3* 13.1*  HCT  --   < > 32.7* 34.0* 32.4*  < > 40* 40* 40*  MCV  --   < > 85.2 86.3 85.5  --   --   --   --   PLT  --   < > 144* 162 167  < > 175 166 173  < > = values in this interval not displayed.  Recent Labs  04/30/16 06/15/16 07/21/16  CHOL 117 121 130  LDLCALC 59 41 62  TRIG 139 256* 189*   No results found for: [facility] Lab Results  Component Value Date   TSH 2.32 07/21/2016   Lab Results  Component Value Date   HGBA1C 6.3 07/15/2016   Lab Results  Component Value Date   CHOL 130 07/21/2016   HDL 30 (A) 07/21/2016   LDLCALC 62 07/21/2016   TRIG 189 (A) 07/21/2016   CHOLHDL 5.0 06/18/2015    Significant Diagnostic Results in last 30 days:  No results found.  Assessment and Plan  DEMENTIA/ HIGH PSA > 100- Dr Cinda Quest felt that as pt is asymptomatic that nothing should be done. Even to start medication the government would require a prostate biopsy and all that would intail, which I know wife is not interested in. We agreed the best course is to do nothing unless there are significant symptoms. I will make wife aware of our discussion- she will be relieved.      [name]. Lyn Hollingshead, MD

## 2016-08-01 ENCOUNTER — Encounter: Payer: Self-pay | Admitting: Internal Medicine

## 2016-08-17 ENCOUNTER — Encounter: Payer: Self-pay | Admitting: Internal Medicine

## 2016-08-17 ENCOUNTER — Non-Acute Institutional Stay (SKILLED_NURSING_FACILITY): Payer: Medicare Other | Admitting: Internal Medicine

## 2016-08-17 DIAGNOSIS — L853 Xerosis cutis: Secondary | ICD-10-CM | POA: Diagnosis not present

## 2016-08-17 DIAGNOSIS — R21 Rash and other nonspecific skin eruption: Secondary | ICD-10-CM | POA: Diagnosis not present

## 2016-08-17 NOTE — Progress Notes (Signed)
[facility] Room Number: 321P Place of Service:  SNF (31)  Randon Goldsmithnne D. Lyn HollingsheadAlexander, MD  Patient Care Team: Wilburn MylarSamuel S Kelly, MD as PCP - General [facility](Family Medicine)  Extended Emergency Contact Information Primary Emergency Contact: Catalano,[name] Address: [address]          [place],  [id number] Home Phone: [phone] Relation: None Secondary Emergency Contact: Herbert SetaYard,Robin  [place] Mobile Phone: [phone] Relation: None    Allergies: Ibuprofen  Chief Complaint  Patient presents with  . Acute Visit    Acute    HPI: Patient is 82 y.o. male who is being seen acutely for a rash. About a month ago pt broke out with an itchy rash shortly after the SNF started doing the pt's laundry instead of his wife. For about a week now his wife is back to doing his laundry and he still has a rash so wife has asked that I see him. Pt admits it itchy.  Past Medical History:  Diagnosis Date  . Diabetes mellitus without complication (HCC)   . Hypertension   . Neuropathy (HCC)   . Status post dilation of esophageal narrowing   . Stented coronary artery   . Stroke (HCC)   . TIA (transient ischemic attack)     Past Surgical History:  Procedure Laterality Date  . CARDIAC CATHETERIZATION    . CORONARY ANGIOPLASTY    . esphageal dilation    . EYE SURGERY    . HERNIA REPAIR    . PROSTATE SURGERY        Medication List       Accurate as of 08/17/16 11:59 PM. Always use your most recent med list.          amLODipine 5 MG tablet Commonly known as:  NORVASC Take 5 mg by mouth daily.   aspirin EC 81 MG tablet Take 1 tablet (81 mg total) by mouth daily.   atorvastatin 20 MG tablet Commonly known as:  LIPITOR Take 20 mg by mouth at bedtime.   glipiZIDE 10 MG 24 hr tablet Commonly known as:  GLUCOTROL XL Take 10 mg by mouth daily.   hydrOXYzine 25 MG capsule Commonly known as:  VISTARIL Take 25 mg by mouth 3 (three) times  daily.   ipratropium 0.02 % nebulizer solution Commonly known as:  ATROVENT Take 0.5 mg by nebulization every 6 (six) hours as needed for wheezing or shortness of breath.   LORazepam 0.5 MG tablet Commonly known as:  ATIVAN Take 1 tablet (0.5 mg total) by mouth at bedtime as needed for anxiety.   Melatonin 10 MG Tabs Take 10 mg by mouth at bedtime.   omeprazole 20 MG capsule Commonly known as:  PRILOSEC Take 20 mg by mouth daily.   risperiDONE 0.25 MG tablet Commonly known as:  RISPERDAL Take 0.25 mg by mouth at bedtime.   sertraline 100 MG tablet Commonly known as:  ZOLOFT Take 100 mg by mouth daily. Anxiety/depression   tamsulosin 0.4 MG Caps capsule Commonly known as:  FLOMAX Take 0.4 mg by mouth daily after supper. 30 mins after supper   TYLENOL PM EXTRA STRENGTH PO Take 1 tablet by mouth at bedtime. insomnia   UNABLE TO FIND Med Name: Magic Cup give with lunch and dinner to support weight status   Vitamin D3 50000 units Tabs Take 1 tablet by mouth once a week. For 12 weeks       Meds ordered this encounter  Medications  . hydrOXYzine (VISTARIL) 25 MG capsule    Sig: Take 25 mg by mouth 3 (three) times daily.    Immunization History  Administered Date(s) Administered  . Influenza-Unspecified 06/11/2016  . PPD Test 11/05/2015, 11/20/2015    Social History  Substance Use Topics  . Smoking status: Former Games developermoker  . Smokeless tobacco: Never Used  . Alcohol use 0.6 oz/week    1 Cans of beer per week     Comment: occasionallyee    Review of Systems  UTO -pt can not fully participate; nursing as per HPI    Vitals:   08/17/16 1404  BP: (!) 173/72  Pulse: 81  Resp: 19  Temp: 98.3 F (36.8 C)   Body mass index is 22.82 kg/m. Physical Exam  GENERAL APPEARANCE: Alert, min conversant, No acute distress  SKIN: slight raised flesh colored rash on trunk and extremities; skin very dry and excoriated HEENT: Unremarkable RESPIRATORY: Breathing is even,  unlabored. Lung sounds are clear   CARDIOVASCULAR: Heart RRR no murmurs, rubs or gallops. No peripheral edema  GASTROINTESTINAL: Abdomen is soft, non-tender, not distended w/ normal bowel sounds.  GENITOURINARY: Bladder non tender, not distended  MUSCULOSKELETAL: No abnormal joints or musculature NEUROLOGIC: Cranial nerves 2-12 grossly intact. Moves all extremities PSYCHIATRIC: dementia, some  behavioral issues making exam difficult  Patient Active Problem List   Diagnosis Date Noted  . Depression 01/04/2016  . HCAP (healthcare-associated pneumonia) 10/20/2015  . Cerebrovascular accident (CVA) due to stenosis of left middle cerebral artery (HCC) 08/27/2015  . Essential hypertension 08/27/2015  . Seizures (HCC) 08/27/2015  . Type 2 diabetes mellitus with circulatory disorder (HCC) 08/27/2015  . Dementia with behavioral disturbance 08/27/2015  . GERD (gastroesophageal reflux disease) 08/13/2015  . Vascular dementia with behavior disturbance 08/07/2015  . Dyslipidemia associated with type 2 diabetes mellitus (HCC) 08/07/2015  . 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 Subdural hematoma (HCC) 07/30/2015  . Dementia 06/23/2015  . BPH (benign prostatic hypertrophy) 06/23/2015  . CAD (coronary artery disease) s/p stent 06/18/2015  . Diabetes mellitus type 2, controlled (HCC) 06/18/2015  . Hyperlipidemia 06/18/2015  . Diabetes mellitus type 2 with neurological manifestations (HCC) 06/18/2015  . Cerebrovascular accident (CVA) (HCC)   . Intracranial carotid stenosis   . Stroke (HCC) 06/17/2015    CMP     Component Value Date/Time   NA 143 07/21/2016   K 3.7 07/21/2016   CL 101 08/04/2015 0525   CO2 29 08/04/2015 0525   GLUCOSE 149 (H) 08/04/2015 0525   BUN 20 07/21/2016   CREATININE 1.1 07/21/2016   CREATININE 1.35 (H) 08/04/2015 0525   CALCIUM 8.5 (L) 08/04/2015 0525   PROT 5.8 (L) 07/30/2015 1021   ALBUMIN 2.7 (L) 07/30/2015 1021   AST 16 07/21/2016   ALT 10 07/21/2016   ALKPHOS  116 07/21/2016   BILITOT 0.5 07/30/2015 1021   GFRNONAA 46 (L) 08/04/2015 0525   GFRAA 53 (L) 08/04/2015 0525    Recent Labs  02/18/16 07/21/16  NA 142 143  K 3.6 3.7  BUN 15 20  CREATININE 1.2 1.1    Recent Labs  07/21/16  AST 16  ALT 10  ALKPHOS 116    Recent Labs  04/30/16 06/15/16 07/21/16  WBC 7.1 8.1 8.2  HGB 12.9* 13.3* 13.1*  HCT 40* 40* 40*  PLT 175 166 173    Recent Labs  04/30/16 06/15/16 07/21/16  CHOL 117 121 130  LDLCALC 59 41 62  TRIG 139 256* 189*   No results  found for: [facility] Lab Results  Component Value Date   TSH 2.32 07/21/2016   Lab Results  Component Value Date   HGBA1C 6.3 07/15/2016   Lab Results  Component Value Date   CHOL 130 07/21/2016   HDL 30 (A) 07/21/2016   LDLCALC 62 07/21/2016   TRIG 189 (A) 07/21/2016   CHOLHDL 5.0 06/18/2015    Significant Diagnostic Results in last 30 days:  No results found.  Assessment and Plan  RASH/XEROSIS - non specific rash, doesn't really look like contact, looks better, may be healing but will make appt for pt with Derm; by the time he gets one he rash may be resolved; dry skin will be treated with daily lotion; will floow    Time spent > 25 min;> 50% of time with patient was spent reviewing records, labs, tests and studies, counseling and developing plan of care  Thurston Hole D. Lyn Hollingshead, MD

## 2016-08-25 ENCOUNTER — Non-Acute Institutional Stay (SKILLED_NURSING_FACILITY): Payer: Medicare Other | Admitting: Internal Medicine

## 2016-08-25 ENCOUNTER — Encounter: Payer: Self-pay | Admitting: Internal Medicine

## 2016-08-25 DIAGNOSIS — R05 Cough: Secondary | ICD-10-CM | POA: Diagnosis not present

## 2016-08-25 DIAGNOSIS — R410 Disorientation, unspecified: Secondary | ICD-10-CM | POA: Diagnosis not present

## 2016-08-25 DIAGNOSIS — R059 Cough, unspecified: Secondary | ICD-10-CM

## 2016-08-25 NOTE — Progress Notes (Signed)
[facility] Room Number: 321P Place of Service:  SNF (31)  Randon Goldsmithnne D. Lyn HollingsheadAlexander, MD  Patient Care Team: Wilburn MylarSamuel S Kelly, MD as PCP - General [facility](Family Medicine)  Extended Emergency Contact Information Primary Emergency Contact: Leaton,[name] Address: [address]          [place]  [id number] Home Phone: [phone] Relation: None Secondary Emergency Contact: Herbert SetaYard,Robin  [place] Mobile Phone: [phone] Relation: None    Allergies: Ibuprofen  Chief Complaint  Patient presents with  . Acute Visit    Acute    HPI: Patient is 82 y.o. male who nursing asked me to see pt for cough for several days with maybe some increased confusion. No fever, not productive. When I went to find pt in dining room DON tells me that pt only coughs now when he eats and drinks.   Past Medical History:  Diagnosis Date  . Diabetes mellitus without complication (HCC)   . Hypertension   . Neuropathy (HCC)   . Status post dilation of esophageal narrowing   . Stented coronary artery   . Stroke (HCC)   . TIA (transient ischemic attack)     Past Surgical History:  Procedure Laterality Date  . CARDIAC CATHETERIZATION    . CORONARY ANGIOPLASTY    . esphageal dilation    . EYE SURGERY    . HERNIA REPAIR    . PROSTATE SURGERY        Medication List       Accurate as of 08/25/16  1:42 PM. Always use your most recent med list.          amLODipine 5 MG tablet Commonly known as:  NORVASC Take 5 mg by mouth daily.   aspirin EC 81 MG tablet Take 1 tablet (81 mg total) by mouth daily.   atorvastatin 20 MG tablet Commonly known as:  LIPITOR Take 20 mg by mouth at bedtime.   glipiZIDE 10 MG 24 hr tablet Commonly known as:  GLUCOTROL XL Take 10 mg by mouth daily.   hydrOXYzine 25 MG capsule Commonly known as:  VISTARIL Take 25 mg by mouth 3 (three) times daily.   ipratropium 0.02 % nebulizer solution Commonly known as:   ATROVENT Take 0.5 mg by nebulization every 6 (six) hours as needed for wheezing or shortness of breath.   LORazepam 0.5 MG tablet Commonly known as:  ATIVAN Take 1 tablet (0.5 mg total) by mouth at bedtime as needed for anxiety.   Melatonin 10 MG Tabs Take 10 mg by mouth at bedtime.   omeprazole 20 MG capsule Commonly known as:  PRILOSEC Take 20 mg by mouth daily.   risperiDONE 0.25 MG tablet Commonly known as:  RISPERDAL Take 0.25 mg by mouth at bedtime.   sertraline 100 MG tablet Commonly known as:  ZOLOFT Take 100 mg by mouth daily. Anxiety/depression   tamsulosin 0.4 MG Caps capsule Commonly known as:  FLOMAX Take 0.4 mg by mouth daily after supper. 30 mins after supper   TYLENOL PM EXTRA STRENGTH PO Take 1 tablet by mouth at bedtime. insomnia   UNABLE TO FIND Med Name: Magic Cup give with lunch and dinner to support weight status   Vitamin D3 50000 units Tabs Take 1 tablet by mouth once a week. For 12 weeks       No orders of the defined types were placed in this encounter.   Immunization History  Administered Date(s) Administered  . Influenza-Unspecified  06/11/2016  . PPD Test 11/05/2015, 11/20/2015    Social History  Substance Use Topics  . Smoking status: Former Games developer  . Smokeless tobacco: Never Used  . Alcohol use 0.6 oz/week    1 Cans of beer per week     Comment: occasionallyee    Review of Systems  UTO 2/2 dementia; nursing as per HPI  Vitals:   08/25/16 1340  BP: 135/77  Pulse: 83  Resp: 18  Temp: 98.3 F (36.8 C)   Body mass index is 21.79 kg/m. Physical Exam  GENERAL APPEARANCE: Alert, min conversant, No acute distress  SKIN: No diaphoresis rash HEENT: Unremarkable RESPIRATORY: Breathing is even, unlabored. Lung sounds are no rales or wheezes  CARDIOVASCULAR: Heart RRR no murmurs, rubs or gallops. No peripheral edema  GASTROINTESTINAL: Abdomen is soft, non-tender, not distended w/ normal bowel sounds.  GENITOURINARY: Bladder  non tender, not distended  MUSCULOSKELETAL: No abnormal joints or musculature NEUROLOGIC: Cranial nerves 2-12 grossly intact. Moves all extremities PSYCHIATRIC: dementia, no behavioral issues  Patient Active Problem List   Diagnosis Date Noted  . Depression 01/04/2016  . HCAP (healthcare-associated pneumonia) 10/20/2015  . Cerebrovascular accident (CVA) due to stenosis of left middle cerebral artery (HCC) 08/27/2015  . Essential hypertension 08/27/2015  . Seizures (HCC) 08/27/2015  . Type 2 diabetes mellitus with circulatory disorder (HCC) 08/27/2015  . Dementia with behavioral disturbance 08/27/2015  . GERD (gastroesophageal reflux disease) 08/13/2015  . Vascular dementia with behavior disturbance 08/07/2015  . Dyslipidemia associated with type 2 diabetes mellitus (HCC) 08/07/2015  . 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 Subdural hematoma (HCC) 07/30/2015  . Dementia 06/23/2015  . BPH (benign prostatic hypertrophy) 06/23/2015  . CAD (coronary artery disease) s/p stent 06/18/2015  . Diabetes mellitus type 2, controlled (HCC) 06/18/2015  . Hyperlipidemia 06/18/2015  . Diabetes mellitus type 2 with neurological manifestations (HCC) 06/18/2015  . Cerebrovascular accident (CVA) (HCC)   . Intracranial carotid stenosis   . Stroke (HCC) 06/17/2015    CMP     Component Value Date/Time   NA 143 07/21/2016   K 3.7 07/21/2016   CL 101 08/04/2015 0525   CO2 29 08/04/2015 0525   GLUCOSE 149 (H) 08/04/2015 0525   BUN 20 07/21/2016   CREATININE 1.1 07/21/2016   CREATININE 1.35 (H) 08/04/2015 0525   CALCIUM 8.5 (L) 08/04/2015 0525   PROT 5.8 (L) 07/30/2015 1021   ALBUMIN 2.7 (L) 07/30/2015 1021   AST 16 07/21/2016   ALT 10 07/21/2016   ALKPHOS 116 07/21/2016   BILITOT 0.5 07/30/2015 1021   GFRNONAA 46 (L) 08/04/2015 0525   GFRAA 53 (L) 08/04/2015 0525    Recent Labs  02/18/16 07/21/16  NA 142 143  K 3.6 3.7  BUN 15 20  CREATININE 1.2 1.1    Recent Labs  07/21/16  AST 16   ALT 10  ALKPHOS 116    Recent Labs  04/30/16 06/15/16 07/21/16  WBC 7.1 8.1 8.2  HGB 12.9* 13.3* 13.1*  HCT 40* 40* 40*  PLT 175 166 173    Recent Labs  04/30/16 06/15/16 07/21/16  CHOL 117 121 130  LDLCALC 59 41 62  TRIG 139 256* 189*   No results found for: [facility] Lab Results  Component Value Date   TSH 2.32 07/21/2016   Lab Results  Component Value Date   HGBA1C 6.3 07/15/2016   Lab Results  Component Value Date   CHOL 130 07/21/2016   HDL 30 (A) 07/21/2016   LDLCALC 62 07/21/2016  TRIG 189 (A) 07/21/2016   CHOLHDL 5.0 06/18/2015    Significant Diagnostic Results in last 30 days:  No results found.  Assessment and Plan  COUGH/CONFUSION - aspiration is possible;will have ST look at pt; could be PNA 2/2 aspiration or other or UTI,;have ordered CBC, BMP, U/A and CXR    Time spent > 25 min Teddie Mehta D. Lyn HollingsheadAlexander, MD

## 2016-08-26 LAB — BASIC METABOLIC PANEL
BUN: 26 mg/dL — AB (ref 4–21)
Creatinine: 1.2 mg/dL (ref 0.6–1.3)
GLUCOSE: 135 mg/dL
Potassium: 3.8 mmol/L (ref 3.4–5.3)
Sodium: 141 mmol/L (ref 137–147)

## 2016-08-26 LAB — CBC AND DIFFERENTIAL
HEMATOCRIT: 36 % — AB (ref 41–53)
HEMOGLOBIN: 11.7 g/dL — AB (ref 13.5–17.5)
PLATELETS: 181 10*3/uL (ref 150–399)
WBC: 6.5 10^3/mL

## 2016-08-27 ENCOUNTER — Encounter: Payer: Self-pay | Admitting: Internal Medicine

## 2016-08-27 ENCOUNTER — Non-Acute Institutional Stay (SKILLED_NURSING_FACILITY): Payer: Medicare Other | Admitting: Internal Medicine

## 2016-08-27 DIAGNOSIS — N400 Enlarged prostate without lower urinary tract symptoms: Secondary | ICD-10-CM | POA: Diagnosis not present

## 2016-08-27 DIAGNOSIS — F29 Unspecified psychosis not due to a substance or known physiological condition: Secondary | ICD-10-CM

## 2016-08-27 DIAGNOSIS — E1169 Type 2 diabetes mellitus with other specified complication: Secondary | ICD-10-CM

## 2016-08-27 DIAGNOSIS — E785 Hyperlipidemia, unspecified: Secondary | ICD-10-CM

## 2016-08-27 NOTE — Progress Notes (Signed)
[facility] Room Number: 321P Place of Service:  SNF (31)  [name]. Lyn Hollingshead, MD  Patient Care Team: Wilburn Mylar, MD as PCP - General [facility] Medicine)  Extended Emergency Contact Information Primary Emergency Contact: Shankland,[name] Address: [address]          [place],  [address] Home Phone: [phone] Relation: None Secondary Emergency Contact: Herbert Seta [place] Mobile Phone: [phone] Relation: None    Allergies: Ibuprofen  Chief Complaint  Patient presents with  . Medical Management of Chronic Issues    Routine Visit    HPI: Patient is 82 y.o. male who is being seen for routine issues of psychosis, HLD and BPH.   Past Medical History:  Diagnosis Date  . Diabetes mellitus without complication (HCC)   . Hypertension   . Neuropathy (HCC)   . Status post dilation of esophageal narrowing   . Stented coronary artery   . Stroke (HCC)   . TIA (transient ischemic attack)     Past Surgical History:  Procedure Laterality Date  . CARDIAC CATHETERIZATION    . CORONARY ANGIOPLASTY    . esphageal dilation    . EYE SURGERY    . HERNIA REPAIR    . PROSTATE SURGERY      Allergies as of 08/27/2016      Reactions   Ibuprofen Other (See Comments)   GI Bleeding      Medication List       Accurate as of 08/27/16 11:59 PM. Always use your most recent med list.          amLODipine 5 MG tablet Commonly known as:  NORVASC Take 5 mg by mouth daily.   aspirin EC 81 MG tablet Take 1 tablet (81 mg total) by mouth daily.   atorvastatin 20 MG tablet Commonly known as:  LIPITOR Take 20 mg by mouth at bedtime.   glipiZIDE 10 MG 24 hr tablet Commonly known as:  GLUCOTROL XL Take 10 mg by mouth daily.   hydrOXYzine 25 MG capsule Commonly known as:  VISTARIL Take 25 mg by mouth 3 (three) times daily.   ipratropium 0.02 % nebulizer solution Commonly known as:  ATROVENT Take 0.5 mg by nebulization  every 6 (six) hours as needed for wheezing or shortness of breath.   LORazepam 0.5 MG tablet Commonly known as:  ATIVAN Take 1 tablet (0.5 mg total) by mouth at bedtime as needed for anxiety.   Melatonin 10 MG Tabs Take 10 mg by mouth at bedtime.   omeprazole 20 MG capsule Commonly known as:  PRILOSEC Take 20 mg by mouth daily.   risperiDONE 0.25 MG tablet Commonly known as:  RISPERDAL Take 0.25 mg by mouth at bedtime.   sertraline 100 MG tablet Commonly known as:  ZOLOFT Take 100 mg by mouth daily. Anxiety/depression   tamsulosin 0.4 MG Caps capsule Commonly known as:  FLOMAX Take 0.4 mg by mouth daily after supper. 30 mins after supper   TYLENOL PM EXTRA STRENGTH PO Take 1 tablet by mouth at bedtime. insomnia   UNABLE TO FIND Med Name: Magic Cup give with lunch and dinner to support weight status   Vitamin D3 50000 units Tabs Take 1 tablet by mouth once a week. For 12 weeks       No orders of the defined types were placed in this encounter.   Immunization History  Administered Date(s) Administered  . Influenza-Unspecified 06/11/2016  . PPD Test  11/05/2015, 11/20/2015    Social History  Substance Use Topics  . Smoking status: Former Games developermoker  . Smokeless tobacco: Never Used  . Alcohol use 0.6 oz/week    1 Cans of beer per week     Comment: occasionallyee    Review of Systems   UTO 2/2 dementia    Vitals:   08/27/16 0930  BP: 135/77  Pulse: 83  Resp: 18  Temp: 98.3 F (36.8 C)   Body mass index is 21.79 kg/m. Physical Exam  GENERAL APPEARANCE: Alert, min conversant, No acute distress  SKIN: No diaphoresis rash HEENT: Unremarkable RESPIRATORY: Breathing is even, unlabored. Lung sounds are clear   CARDIOVASCULAR: Heart RRR no murmurs, rubs or gallops. No peripheral edema  GASTROINTESTINAL: Abdomen is soft, non-tender, not distended w/ normal bowel sounds.  GENITOURINARY: Bladder non tender, not distended  MUSCULOSKELETAL: No abnormal joints or  musculature NEUROLOGIC: Cranial nerves 2-12 grossly intact. Moves all extremities PSYCHIATRIC: dementia, no behavioral issues  Patient Active Problem List   Diagnosis Date Noted  . Depression 01/04/2016  . HCAP (healthcare-associated pneumonia) 10/20/2015  . Cerebrovascular accident (CVA) due to stenosis of left middle cerebral artery (HCC) 08/27/2015  . Essential hypertension 08/27/2015  . Seizures (HCC) 08/27/2015  . Type 2 diabetes mellitus with circulatory disorder (HCC) 08/27/2015  . Dementia with behavioral disturbance 08/27/2015  . GERD (gastroesophageal reflux disease) 08/13/2015  . Vascular dementia with behavior disturbance 08/07/2015  . Dyslipidemia associated with type 2 diabetes mellitus (HCC) 08/07/2015  . 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 Subdural hematoma (HCC) 07/30/2015  . Dementia 06/23/2015  . Benign prostatic hyperplasia 06/23/2015  . CAD (coronary artery disease) s/p stent 06/18/2015  . Diabetes mellitus type 2, controlled (HCC) 06/18/2015  . Hyperlipidemia 06/18/2015  . Diabetes mellitus type 2 with neurological manifestations (HCC) 06/18/2015  . Cerebrovascular accident (CVA) (HCC)   . Intracranial carotid stenosis   . Stroke (HCC) 06/17/2015    CMP     Component Value Date/Time   NA 143 07/21/2016   K 3.7 07/21/2016   CL 101 08/04/2015 0525   CO2 29 08/04/2015 0525   GLUCOSE 149 (H) 08/04/2015 0525   BUN 20 07/21/2016   CREATININE 1.1 07/21/2016   CREATININE 1.35 (H) 08/04/2015 0525   CALCIUM 8.5 (L) 08/04/2015 0525   PROT 5.8 (L) 07/30/2015 1021   ALBUMIN 2.7 (L) 07/30/2015 1021   AST 16 07/21/2016   ALT 10 07/21/2016   ALKPHOS 116 07/21/2016   BILITOT 0.5 07/30/2015 1021   GFRNONAA 46 (L) 08/04/2015 0525   GFRAA 53 (L) 08/04/2015 0525    Recent Labs  02/18/16 07/21/16  NA 142 143  K 3.6 3.7  BUN 15 20  CREATININE 1.2 1.1    Recent Labs  07/21/16  AST 16  ALT 10  ALKPHOS 116    Recent Labs  04/30/16 06/15/16 07/21/16  WBC  7.1 8.1 8.2  HGB 12.9* 13.3* 13.1*  HCT 40* 40* 40*  PLT 175 166 173    Recent Labs  04/30/16 06/15/16 07/21/16  CHOL 117 121 130  LDLCALC 59 41 62  TRIG 139 256* 189*   No results found for: [facility] Lab Results  Component Value Date   TSH 2.32 07/21/2016   Lab Results  Component Value Date   HGBA1C 6.3 07/15/2016   Lab Results  Component Value Date   CHOL 130 07/21/2016   HDL 30 (A) 07/21/2016   LDLCALC 62 07/21/2016   TRIG 189 (A) 07/21/2016   CHOLHDL 5.0  06/18/2015    Significant Diagnostic Results in last 30 days:  No results found.  Assessment and Plan  Nonorganic psychosis No episodes of paranoia or hallucinations; plan to cont risperdal 0.25 mg q HS  Depression Pt has been stable  Dyslipidemia associated with type 2 diabetes mellitus (HCC) In 07/2016 LDL 62, HDL 30; plan to cont lipitor 20 mg  Benign prostatic hyperplasia Continues with no UTIs; plan to cont folmax 0.4 mg daily    Fancy Dunkley D. Lyn HollingsheadAlexander, MD

## 2016-08-29 ENCOUNTER — Encounter: Payer: Self-pay | Admitting: Internal Medicine

## 2016-08-29 NOTE — Assessment & Plan Note (Signed)
Continues with no UTIs; plan to cont folmax 0.4 mg daily

## 2016-08-29 NOTE — Assessment & Plan Note (Signed)
Pt has been stable

## 2016-08-29 NOTE — Assessment & Plan Note (Signed)
In 07/2016 LDL 62, HDL 30; plan to cont lipitor 20 mg

## 2016-08-29 NOTE — Assessment & Plan Note (Signed)
No episodes of paranoia or hallucinations; plan to cont risperdal 0.25 mg q HS

## 2016-10-06 ENCOUNTER — Non-Acute Institutional Stay (SKILLED_NURSING_FACILITY): Payer: Medicare Other | Admitting: Internal Medicine

## 2016-10-06 ENCOUNTER — Encounter: Payer: Self-pay | Admitting: Internal Medicine

## 2016-10-06 DIAGNOSIS — I2510 Atherosclerotic heart disease of native coronary artery without angina pectoris: Secondary | ICD-10-CM | POA: Diagnosis not present

## 2016-10-06 DIAGNOSIS — I10 Essential (primary) hypertension: Secondary | ICD-10-CM

## 2016-10-06 DIAGNOSIS — K219 Gastro-esophageal reflux disease without esophagitis: Secondary | ICD-10-CM

## 2016-10-06 NOTE — Progress Notes (Signed)
[facility] Room Number: 321P Place of Service:  SNF (31)  [name]. Lyn Hollingshead, MD  Patient Care Team: Wilburn Mylar, MD as PCP - General [facility] Medicine)  Extended Emergency Contact Information Primary Emergency Contact: Neuser,[name] Address: [address]          [place],  [address] Home Phone: [phone] Relation: None Secondary Emergency Contact: Herbert Seta [place] Mobile Phone: [phone] Relation: None    Allergies: Ibuprofen  Chief Complaint  Patient presents with  . Medical Management of Chronic Issues    Routine visit    HPI: Patient is 83 y.o. male who is being seen for routine issues of CAD, HTN, and GERD.  Past Medical History:  Diagnosis Date  . Diabetes mellitus without complication (HCC)   . Hypertension   . Neuropathy (HCC)   . Status post dilation of esophageal narrowing   . Stented coronary artery   . Stroke (HCC)   . TIA (transient ischemic attack)     Past Surgical History:  Procedure Laterality Date  . CARDIAC CATHETERIZATION    . CORONARY ANGIOPLASTY    . esphageal dilation    . EYE SURGERY    . HERNIA REPAIR    . PROSTATE SURGERY      Allergies as of 10/06/2016      Reactions   Ibuprofen Other (See Comments)   GI Bleeding      Medication List       Accurate as of 10/06/16 11:59 PM. Always use your most recent med list.          amLODipine 5 MG tablet Commonly known as:  NORVASC Take 5 mg by mouth daily.   aspirin EC 81 MG tablet Take 1 tablet (81 mg total) by mouth daily.   atorvastatin 20 MG tablet Commonly known as:  LIPITOR Take 20 mg by mouth at bedtime.   glipiZIDE 10 MG 24 hr tablet Commonly known as:  GLUCOTROL XL Take 10 mg by mouth daily.   hydrOXYzine 25 MG capsule Commonly known as:  VISTARIL Take 25 mg by mouth 3 (three) times daily.   ipratropium 0.02 % nebulizer solution Commonly known as:  ATROVENT Take 0.5 mg by nebulization every 6  (six) hours as needed for wheezing or shortness of breath.   LORazepam 0.5 MG tablet Commonly known as:  ATIVAN Take 1 tablet (0.5 mg total) by mouth at bedtime as needed for anxiety.   Melatonin 10 MG Tabs Take 10 mg by mouth at bedtime.   NUTRITIONAL SUPPLEMENT Liqd Take by mouth. Initiate 4 oz of Medpass by mouth 2 times daily due to weight loss   omeprazole 20 MG capsule Commonly known as:  PRILOSEC Take 20 mg by mouth daily.   risperiDONE 0.25 MG tablet Commonly known as:  RISPERDAL Take 0.25 mg by mouth at bedtime.   sertraline 100 MG tablet Commonly known as:  ZOLOFT Take 100 mg by mouth daily. Anxiety/depression   tamsulosin 0.4 MG Caps capsule Commonly known as:  FLOMAX Take 0.4 mg by mouth daily after supper. 30 mins after supper   TYLENOL PM EXTRA STRENGTH PO Take 1 tablet by mouth at bedtime. insomnia   UNABLE TO FIND Med Name: Magic Cup give with lunch and dinner to support weight status   Vitamin D3 50000 units Tabs Take 1 tablet by mouth once a week. For 12 weeks       No orders of the defined types  were placed in this encounter.   Immunization History  Administered Date(s) Administered  . Influenza-Unspecified 06/11/2016  . PPD Test 11/05/2015, 11/20/2015    Social History  Substance Use Topics  . Smoking status: Former Games developermoker  . Smokeless tobacco: Never Used  . Alcohol use 0.6 oz/week    1 Cans of beer per week     Comment: occasionallyee    Review of Systems  UTO 2/2 dementia; nursing without concerns    Vitals:   10/06/16 0940  BP: (!) 158/63  Pulse: 66  Resp: 20  Temp: 97.3 F (36.3 C)   Body mass index is 21.6 kg/m. Physical Exam  GENERAL APPEARANCE: Alert, min conversant, No acute distress  SKIN: No diaphoresis rash HEENT: Unremarkable RESPIRATORY: Breathing is even, unlabored. Lung sounds are clear   CARDIOVASCULAR: Heart RRR no murmurs, rubs or gallops. No peripheral edema  GASTROINTESTINAL: Abdomen is soft,  non-tender, not distended w/ normal bowel sounds.  GENITOURINARY: Bladder non tender, not distended  MUSCULOSKELETAL: No abnormal joints or musculature NEUROLOGIC: Cranial nerves 2-12 grossly intact. Moves all extremities PSYCHIATRIC: dementia, no behavioral issues  Patient Active Problem List   Diagnosis Date Noted  . Depression 01/04/2016  . HCAP (healthcare-associated pneumonia) 10/20/2015  . Cerebrovascular accident (CVA) due to stenosis of left middle cerebral artery (HCC) 08/27/2015  . Essential hypertension 08/27/2015  . Seizures (HCC) 08/27/2015  . Type 2 diabetes mellitus with circulatory disorder (HCC) 08/27/2015  . Dementia with behavioral disturbance 08/27/2015  . GERD (gastroesophageal reflux disease) 08/13/2015  . Vascular dementia with behavior disturbance 08/07/2015  . Dyslipidemia associated with type 2 diabetes mellitus (HCC) 08/07/2015  . 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 Subdural hematoma (HCC) 07/30/2015  . Dementia 06/23/2015  . Benign prostatic hyperplasia 06/23/2015  . CAD (coronary artery disease) s/p stent 06/18/2015  . Diabetes mellitus type 2, controlled (HCC) 06/18/2015  . Hyperlipidemia 06/18/2015  . Diabetes mellitus type 2 with neurological manifestations (HCC) 06/18/2015  . Cerebrovascular accident (CVA) (HCC)   . Intracranial carotid stenosis   . Stroke (HCC) 06/17/2015    CMP     Component Value Date/Time   NA 141 08/26/2016   K 3.8 08/26/2016   CL 101 08/04/2015 0525   CO2 29 08/04/2015 0525   GLUCOSE 149 (H) 08/04/2015 0525   BUN 26 (A) 08/26/2016   CREATININE 1.2 08/26/2016   CREATININE 1.35 (H) 08/04/2015 0525   CALCIUM 8.5 (L) 08/04/2015 0525   PROT 5.8 (L) 07/30/2015 1021   ALBUMIN 2.7 (L) 07/30/2015 1021   AST 16 07/21/2016   ALT 10 07/21/2016   ALKPHOS 116 07/21/2016   BILITOT 0.5 07/30/2015 1021   GFRNONAA 46 (L) 08/04/2015 0525   GFRAA 53 (L) 08/04/2015 0525    Recent Labs  02/18/16 07/21/16 08/26/16  NA 142 143 141    K 3.6 3.7 3.8  BUN 15 20 26*  CREATININE 1.2 1.1 1.2    Recent Labs  07/21/16  AST 16  ALT 10  ALKPHOS 116    Recent Labs  06/15/16 07/21/16 08/26/16  WBC 8.1 8.2 6.5  HGB 13.3* 13.1* 11.7*  HCT 40* 40* 36*  PLT 166 173 181    Recent Labs  04/30/16 06/15/16 07/21/16  CHOL 117 121 130  LDLCALC 59 41 62  TRIG 139 256* 189*   [facility] Results  Component Value Date   TSH 2.32 07/21/2016   Lab Results  Component Value Date   HGBA1C 6.6 07/21/2016   Lab  Results  Component Value Date   CHOL 130 07/21/2016   HDL 30 (A) 07/21/2016   LDLCALC 62 07/21/2016   TRIG 189 (A) 07/21/2016   CHOLHDL 5.0 06/18/2015    Significant Diagnostic Results in last 30 days:  No results found.  Assessment and Plan  CAD (coronary artery disease) s/p stent No c/o CP or equivalent; 'plan to cont lipitor and ASA; not on plavix 2/2 SDH  Essential hypertension BP better controlled but could be better; will inc norvasc to 10 mg daily  GERD (gastroesophageal reflux disease) No reported reflux; plan to cont omeprazole 20 mg daily    Jaydn Moscato D. Lyn Hollingshead, MD

## 2016-10-14 ENCOUNTER — Non-Acute Institutional Stay (SKILLED_NURSING_FACILITY): Payer: Medicare Other | Admitting: Internal Medicine

## 2016-10-14 DIAGNOSIS — Z20828 Contact with and (suspected) exposure to other viral communicable diseases: Secondary | ICD-10-CM

## 2016-10-23 ENCOUNTER — Encounter: Payer: Self-pay | Admitting: Internal Medicine

## 2016-10-23 NOTE — Assessment & Plan Note (Signed)
BP better controlled but could be better; will inc norvasc to 10 mg daily

## 2016-10-23 NOTE — Assessment & Plan Note (Signed)
No reported reflux; plan to cont omeprazole 20 mg daily

## 2016-10-23 NOTE — Assessment & Plan Note (Signed)
No c/o CP or equivalent; 'plan to cont lipitor and ASA; not on plavix 2/2 SDH

## 2016-11-04 ENCOUNTER — Non-Acute Institutional Stay (SKILLED_NURSING_FACILITY): Payer: Medicare Other | Admitting: Internal Medicine

## 2016-11-04 ENCOUNTER — Encounter: Payer: Self-pay | Admitting: Internal Medicine

## 2016-11-04 DIAGNOSIS — E1149 Type 2 diabetes mellitus with other diabetic neurological complication: Secondary | ICD-10-CM | POA: Diagnosis not present

## 2016-11-04 DIAGNOSIS — F01518 Vascular dementia, unspecified severity, with other behavioral disturbance: Secondary | ICD-10-CM

## 2016-11-04 DIAGNOSIS — F0151 Vascular dementia with behavioral disturbance: Secondary | ICD-10-CM | POA: Diagnosis not present

## 2016-11-04 DIAGNOSIS — F39 Unspecified mood [affective] disorder: Secondary | ICD-10-CM

## 2016-11-04 NOTE — Progress Notes (Signed)
Location:  [facility] Room Number: 321P Place of Service:  SNF (31)  [name]. Lyn Hollingshead, MD  Patient Care Team: Wilburn Mylar, MD as PCP - [facility])  Extended Emergency Contact Information Primary Emergency Contact: Streb,[name] Address: [address]          [place],  [address] Home Phone: [phone] Relation: None Secondary Emergency Contact: Herbert Seta [place] Mobile Phone: [phone] Relation: None    Allergies: Ibuprofen  Chief Complaint  Patient presents with  . Medical Management of Chronic Issues    Routine Visit    HPI: Patient is 83 y.o. male who is being seen for routine issues of dementia, mood disorder, and DM2.  Past Medical History:  Diagnosis Date  . Diabetes mellitus without complication (HCC)   . Hypertension   . Neuropathy (HCC)   . Status post dilation of esophageal narrowing   . Stented coronary artery   . Stroke (HCC)   . TIA (transient ischemic attack)     Past Surgical History:  Procedure Laterality Date  . CARDIAC CATHETERIZATION    . CORONARY ANGIOPLASTY    . esphageal dilation    . EYE SURGERY    . HERNIA REPAIR    . PROSTATE SURGERY      Allergies as of 11/04/2016      Reactions   Ibuprofen Other (See Comments)   GI Bleeding      Medication List       Accurate as of 11/04/16 11:59 PM. Always use your most recent med list.          amLODipine 5 MG tablet Commonly known as:  NORVASC Take 5 mg by mouth daily.   aspirin EC 81 MG tablet Take 1 tablet (81 mg total) by mouth daily.   atorvastatin 20 MG tablet Commonly known as:  LIPITOR Take 20 mg by mouth at bedtime.   glipiZIDE 10 MG 24 hr tablet Commonly known as:  GLUCOTROL XL Take 10 mg by mouth daily.   hydrOXYzine 25 MG capsule Commonly known as:  VISTARIL Take 25 mg by mouth 3 (three) times daily.   ipratropium 0.02 % nebulizer solution Commonly known as:  ATROVENT Take 0.5 mg by  nebulization every 6 (six) hours as needed for wheezing or shortness of breath.   LORazepam 0.5 MG tablet Commonly known as:  ATIVAN Take 1 tablet (0.5 mg total) by mouth at bedtime as needed for anxiety.   Melatonin 10 MG Tabs Take 10 mg by mouth at bedtime.   NUTRITIONAL SUPPLEMENT Liqd Take by mouth. Initiate 4 oz of Medpass by mouth 2 times daily due to weight loss   omeprazole 20 MG capsule Commonly known as:  PRILOSEC Take 20 mg by mouth daily.   risperiDONE 0.25 MG tablet Commonly known as:  RISPERDAL Take 0.25 mg by mouth at bedtime.   sertraline 100 MG tablet Commonly known as:  ZOLOFT Take 100 mg by mouth daily. Anxiety/depression   tamsulosin 0.4 MG Caps capsule Commonly known as:  FLOMAX Take 0.4 mg by mouth daily after supper. 30 mins after supper   UNABLE TO FIND Med Name: Magic Cup give with lunch and dinner to support weight status       No orders of the defined types were placed in this encounter.   Immunization History  Administered Date(s) Administered  . Influenza-Unspecified 06/11/2016  . PPD Test 11/05/2015, 11/20/2015    Social History  Substance Use Topics  .  Smoking status: Former Games developer  . Smokeless tobacco: Never Used  . Alcohol use 0.6 oz/week    1 Cans of beer per week     Comment: occasionallyee    Review of Systems  UTO 2/2 dementia    Vitals:   11/04/16 1121  BP: (!) 158/75  Pulse: 66  Resp: 16  Temp: 97.3 F (36.3 C)   Body mass index is 21.6 kg/m. Physical Exam  GENERAL APPEARANCE: Alert, min conversant, No acute distress  SKIN: No diaphoresis rash HEENT: Unremarkable RESPIRATORY: Breathing is even, unlabored. Lung sounds are clear   CARDIOVASCULAR: Heart RRR no murmurs, rubs or gallops. No peripheral edema  GASTROINTESTINAL: Abdomen is soft, non-tender, not distended w/ normal bowel sounds.  GENITOURINARY: Bladder non tender, not distended  MUSCULOSKELETAL: No abnormal joints or musculature NEUROLOGIC:  Cranial nerves 2-12 grossly intact. Moves all extremities PSYCHIATRIC: dementia, no behavioral issues  Patient Active Problem List   Diagnosis Date Noted  . Mood disorder (HCC) 11/20/2016  . Depression 01/04/2016  . HCAP (healthcare-associated pneumonia) 10/20/2015  . Cerebrovascular accident (CVA) due to stenosis of left middle cerebral artery (HCC) 08/27/2015  . Essential hypertension 08/27/2015  . Seizures (HCC) 08/27/2015  . Type 2 diabetes mellitus with circulatory disorder (HCC) 08/27/2015  . Dementia with behavioral disturbance 08/27/2015  . GERD (gastroesophageal reflux disease) 08/13/2015  . Vascular dementia with behavior disturbance 08/07/2015  . Dyslipidemia associated with type 2 diabetes mellitus (HCC) 08/07/2015  . 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 Subdural hematoma (HCC) 07/30/2015  . Dementia 06/23/2015  . Benign prostatic hyperplasia 06/23/2015  . CAD (coronary artery disease) s/p stent 06/18/2015  . Diabetes mellitus type 2, controlled (HCC) 06/18/2015  . Hyperlipidemia 06/18/2015  . Diabetes mellitus type 2 with neurological manifestations (HCC) 06/18/2015  . Cerebrovascular accident (CVA) (HCC)   . Intracranial carotid stenosis   . Stroke (HCC) 06/17/2015    CMP     Component Value Date/Time   NA 141 08/26/2016   K 3.8 08/26/2016   CL 101 08/04/2015 0525   CO2 29 08/04/2015 0525   GLUCOSE 149 (H) 08/04/2015 0525   BUN 26 (A) 08/26/2016   CREATININE 1.2 08/26/2016   CREATININE 1.35 (H) 08/04/2015 0525   CALCIUM 8.5 (L) 08/04/2015 0525   PROT 5.8 (L) 07/30/2015 1021   ALBUMIN 2.7 (L) 07/30/2015 1021   AST 16 07/21/2016   ALT 10 07/21/2016   ALKPHOS 116 07/21/2016   BILITOT 0.5 07/30/2015 1021   GFRNONAA 46 (L) 08/04/2015 0525   GFRAA 53 (L) 08/04/2015 0525    Recent Labs  02/18/16 07/21/16 08/26/16  NA 142 143 141  K 3.6 3.7 3.8  BUN 15 20 26*  CREATININE 1.2 1.1 1.2    Recent Labs  07/21/16  AST 16  ALT 10  ALKPHOS 116    Recent  Labs  06/15/16 07/21/16 08/26/16  WBC 8.1 8.2 6.5  HGB 13.3* 13.1* 11.7*  HCT 40* 40* 36*  PLT 166 173 181    Recent Labs  04/30/16 06/15/16 07/21/16  CHOL 117 121 130  LDLCALC 59 41 62  TRIG 139 256* 189*   No results found for: MICROALBUR Lab Results  Component Value Date   TSH 2.32 07/21/2016   Lab Results  Component Value Date   HGBA1C 6.6 07/21/2016   Lab Results  Component Value Date   CHOL 130 07/21/2016   HDL 30 (A) 07/21/2016   LDLCALC 62 07/21/2016   TRIG 189 (A) 07/21/2016   CHOLHDL 5.0 06/18/2015  Significant Diagnostic Results in last 30 days:  No results found.  Assessment and Plan  Vascular dementia with behavior disturbance Chronic and progressive ; cont supportive care  Mood disorder (HCC) Stable ;plan to cont risperdal 0.25 mg qHS  Diabetes mellitus type 2 with neurological manifestations (HCC) A1c is 6.6 on glucatrolXL 10 mg daily; pt on statin, not on ACE    Shaynah Hund D. Lyn HollingsheadAlexander, MD

## 2016-11-20 ENCOUNTER — Encounter: Payer: Self-pay | Admitting: Internal Medicine

## 2016-11-20 DIAGNOSIS — F39 Unspecified mood [affective] disorder: Secondary | ICD-10-CM | POA: Insufficient documentation

## 2016-11-20 NOTE — Assessment & Plan Note (Signed)
A1c is 6.6 on glucatrolXL 10 mg daily; pt on statin, not on ACE

## 2016-11-20 NOTE — Assessment & Plan Note (Signed)
Stable ;plan to cont risperdal 0.25 mg qHS

## 2016-11-20 NOTE — Assessment & Plan Note (Signed)
Chronic and progressive ; cont supportive care

## 2016-11-24 ENCOUNTER — Encounter: Payer: Self-pay | Admitting: Internal Medicine

## 2016-11-24 NOTE — Progress Notes (Signed)
Location:  [facility] Room Number: 321P Place of Service:  SNF (31)  Parker Hunter. Parker Hollingshead, MD  Patient Care Team: Margit Hanks, MD as PCP - General (Internal Medicine)  Extended Emergency Contact Information Primary Emergency Contact: Czarnecki,[name] Address: [address]          [place],  [address] Home Phone: [phone] Relation: None Secondary Emergency Contact: Herbert Seta [place] Mobile Phone: [phone] Relation: None    Allergies: Ibuprofen  Chief Complaint  Patient presents with  . Acute Visit    HPI: Patient is 83 y.o. male who is being seen acutely because an outbreak of Influenza A per CDC guidelines was recognized on 10/13/2016. Pt has no c/o flu like symptoms;therefore pt will need to be prophylaxed with Tamiflu for a minimum of 14 days per CDC protocol.  Past Medical History:  Diagnosis Date  . Diabetes mellitus without complication (HCC)   . Hypertension   . Neuropathy (HCC)   . Status post dilation of esophageal narrowing   . Stented coronary artery   . Stroke (HCC)   . TIA (transient ischemic attack)     Past Surgical History:  Procedure Laterality Date  . CARDIAC CATHETERIZATION    . CORONARY ANGIOPLASTY    . esphageal dilation    . EYE SURGERY    . HERNIA REPAIR    . PROSTATE SURGERY      Allergies as of 10/14/2016      Reactions   Ibuprofen Other (See Comments)   GI Bleeding      Medication List       Accurate as of 10/14/16 11:59 PM. Always use your most recent med list.          amLODipine 10 MG tablet Commonly known as:  NORVASC Take 10 mg by mouth daily.   aspirin EC 81 MG tablet Take 1 tablet (81 mg total) by mouth daily.   atorvastatin 20 MG tablet Commonly known as:  LIPITOR Take 20 mg by mouth at bedtime.   glipiZIDE 10 MG 24 hr tablet Commonly known as:  GLUCOTROL XL Take 10 mg by mouth daily.   hydrOXYzine 25 MG capsule Commonly known as:  VISTARIL Take 25 mg by  mouth 3 (three) times daily.   ipratropium 0.02 % nebulizer solution Commonly known as:  ATROVENT Take 0.5 mg by nebulization every 6 (six) hours as needed for wheezing or shortness of breath.   LORazepam 0.5 MG tablet Commonly known as:  ATIVAN Take 1 tablet (0.5 mg total) by mouth at bedtime as needed for anxiety.   Melatonin 10 MG Tabs Take 10 mg by mouth at bedtime.   NUTRITIONAL SUPPLEMENT Liqd Take by mouth. Initiate 4 oz of Medpass by mouth 2 times daily due to weight loss   omeprazole 20 MG capsule Commonly known as:  PRILOSEC Take 20 mg by mouth daily.   risperiDONE 0.25 MG tablet Commonly known as:  RISPERDAL Take 0.25 mg by mouth at bedtime.   sertraline 100 MG tablet Commonly known as:  ZOLOFT Take 100 mg by mouth daily. Anxiety/depression   tamsulosin 0.4 MG Caps capsule Commonly known as:  FLOMAX Take 0.4 mg by mouth daily after supper. 30 mins after supper   UNABLE TO FIND Med Name: Magic Cup give with lunch and dinner to support weight status       No orders of the defined types were placed in this encounter.   Immunization History  Administered  Date(s) Administered  . Influenza-Unspecified 06/11/2016  . PPD Test 11/05/2015, 11/20/2015    Social History  Substance Use Topics  . Smoking status: Former Games developermoker  . Smokeless tobacco: Never Used  . Alcohol use 0.6 oz/week    1 Cans of beer per week     Comment: occasionallyee    Review of Systems  DATA OBTAINED: from nurse GENERAL:  no fevers SKIN: No itching, rash HEENT: no rhinorrhea, congestion, ST or ear pain RESPIRATORY: No cough, wheezing, SOB CARDIAC: No chest pain, palpitations, lower extremity edema  GI: No abdominal pain, No N/V/D or constipation, No heartburn or reflux  MUSCULOSKELETAL: No muscle aches NEUROLOGIC: No headache, dizziness   Vitals:   10/14/16 1629  BP: (!) 158/75  Pulse: 67  Resp: 19  Temp: 97.3 F (36.3 C)   Body mass index is 21.13 kg/m. Physical  Exam  GENERAL APPEARANCE: Alert, conversant, No acute distress  SKIN: No diaphoresis rash HEENT: Unremarkable RESPIRATORY: Breathing is even, unlabored. Lung sounds are clear   CARDIOVASCULAR: Heart RRR no murmurs, rubs or gallops. No peripheral edema  GASTROINTESTINAL: Abdomen is soft, non-tender, not distended w/ normal bowel sounds.   NEUROLOGIC: Cranial nerves 2-12 grossly intact PSYCHIATRIC: baseline, no mental status changes  Patient Active Problem List   Diagnosis Date Noted  . Mood disorder (HCC) 11/20/2016  . Depression 01/04/2016  . HCAP (healthcare-associated pneumonia) 10/20/2015  . Cerebrovascular accident (CVA) due to stenosis of left middle cerebral artery (HCC) 08/27/2015  . Essential hypertension 08/27/2015  . Seizures (HCC) 08/27/2015  . Type 2 diabetes mellitus with circulatory disorder (HCC) 08/27/2015  . Dementia with behavioral disturbance 08/27/2015  . GERD (gastroesophageal reflux disease) 08/13/2015  . Vascular dementia with behavior disturbance 08/07/2015  . Dyslipidemia associated with type 2 diabetes mellitus (HCC) 08/07/2015  . 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 Subdural hematoma (HCC) 07/30/2015  . Dementia 06/23/2015  . Benign prostatic hyperplasia 06/23/2015  . CAD (coronary artery disease) s/p stent 06/18/2015  . Diabetes mellitus type 2, controlled (HCC) 06/18/2015  . Hyperlipidemia 06/18/2015  . Diabetes mellitus type 2 with neurological manifestations (HCC) 06/18/2015  . Cerebrovascular accident (CVA) (HCC)   . Intracranial carotid stenosis   . Stroke (HCC) 06/17/2015    CMP     Component Value Date/Time   NA 141 08/26/2016   K 3.8 08/26/2016   CL 101 08/04/2015 0525   CO2 29 08/04/2015 0525   GLUCOSE 149 (H) 08/04/2015 0525   BUN 26 (A) 08/26/2016   CREATININE 1.2 08/26/2016   CREATININE 1.35 (H) 08/04/2015 0525   CALCIUM 8.5 (L) 08/04/2015 0525   PROT 5.8 (L) 07/30/2015 1021   ALBUMIN 2.7 (L) 07/30/2015 1021   AST 16 07/21/2016    ALT 10 07/21/2016   ALKPHOS 116 07/21/2016   BILITOT 0.5 07/30/2015 1021   GFRNONAA 46 (L) 08/04/2015 0525   GFRAA 53 (L) 08/04/2015 0525    Recent Labs  02/18/16 07/21/16 08/26/16  NA 142 143 141  K 3.6 3.7 3.8  BUN 15 20 26*  CREATININE 1.2 1.1 1.2    Recent Labs  07/21/16  AST 16  ALT 10  ALKPHOS 116    Recent Labs  06/15/16 07/21/16 08/26/16  WBC 8.1 8.2 6.5  HGB 13.3* 13.1* 11.7*  HCT 40* 40* 36*  PLT 166 173 181    Recent Labs  04/30/16 06/15/16 07/21/16  CHOL 117 121 130  LDLCALC 59 41 62  TRIG 139 256* 189*   No results found for: [facility]  Lab Results  Component Value Date   TSH 2.32 07/21/2016   Lab Results  Component Value Date   HGBA1C 6.6 07/21/2016   Lab Results  Component Value Date   CHOL 130 07/21/2016   HDL 30 (A) 07/21/2016   LDLCALC 62 07/21/2016   TRIG 189 (A) 07/21/2016   CHOLHDL 5.0 06/18/2015    Significant Diagnostic Results in last 30 days:  No results found.  Assessment and Plan  EXPOSURE TO FLU/ INFLUENZA OUTBREAK AT SNF-   CrCl calculated by me-    38    Dose for 14 days-  30 mg daily Pt will be monitored daily for flu like symptoms                                                                                 Thurston Hole D. Parker Hollingshead, MD

## 2016-12-01 LAB — HM DIABETES FOOT EXAM

## 2016-12-03 ENCOUNTER — Encounter: Payer: Self-pay | Admitting: Internal Medicine

## 2016-12-03 ENCOUNTER — Non-Acute Institutional Stay (SKILLED_NURSING_FACILITY): Payer: Medicare Other | Admitting: Internal Medicine

## 2016-12-03 DIAGNOSIS — E785 Hyperlipidemia, unspecified: Secondary | ICD-10-CM

## 2016-12-03 DIAGNOSIS — E1169 Type 2 diabetes mellitus with other specified complication: Secondary | ICD-10-CM

## 2016-12-03 DIAGNOSIS — R569 Unspecified convulsions: Secondary | ICD-10-CM

## 2016-12-03 DIAGNOSIS — F29 Unspecified psychosis not due to a substance or known physiological condition: Secondary | ICD-10-CM | POA: Diagnosis not present

## 2016-12-03 NOTE — Progress Notes (Signed)
[facility] Room Number: 321P Place of Service:  SNF [phone](31)  Merrilee SeashoreAnne Tyrah Broers, MD  Patient Care Team: Margit HanksAnne D Lucca Greggs, MD as PCP - General (Internal Medicine)  Extended Emergency Contact Information Primary Emergency Contact: Dice,[name] Address: [address]          [place]  [id number] Home Phone: [phone] Relation: None Secondary Emergency Contact: Herbert SetaYard,Robin  [place] Mobile Phone: [phone] Relation: None    Allergies: Ibuprofen  Chief Complaint  Patient presents with  . Medical Management of Chronic Issues    Routine Visit    HPI: Patient is 83 y.o. male Hospice pt who is being seen for routine issues of seizures, psychosis and HLD.   Past Medical History:  Diagnosis Date  . Diabetes mellitus without complication (HCC)   . Hypertension   . Neuropathy (HCC)   . Status post dilation of esophageal narrowing   . Stented coronary artery   . Stroke (HCC)   . TIA (transient ischemic attack)     Past Surgical History:  Procedure Laterality Date  . CARDIAC CATHETERIZATION    . CORONARY ANGIOPLASTY    . esphageal dilation    . EYE SURGERY    . HERNIA REPAIR    . PROSTATE SURGERY      Allergies as of 12/03/2016      Reactions   Ibuprofen Other (See Comments)   GI Bleeding      Medication List       Accurate as of 12/03/16 11:59 PM. Always use your most recent med list.          amLODipine 10 MG tablet Commonly known as:  NORVASC Take 10 mg by mouth daily.   aspirin EC 81 MG tablet Take 1 tablet (81 mg total) by mouth daily.   atorvastatin 20 MG tablet Commonly known as:  LIPITOR Take 20 mg by mouth at bedtime.   glipiZIDE 10 MG 24 hr tablet Commonly known as:  GLUCOTROL XL Take 10 mg by mouth daily.   hydrOXYzine 25 MG capsule Commonly known as:  VISTARIL Take 25 mg by mouth 3 (three) times daily.   ipratropium 0.02 % nebulizer solution Commonly known as:  ATROVENT Take 0.5 mg  by nebulization every 6 (six) hours as needed for wheezing or shortness of breath.   LORazepam 0.5 MG tablet Commonly known as:  ATIVAN Take 1 tablet (0.5 mg total) by mouth at bedtime as needed for anxiety.   Melatonin 10 MG Tabs Take 10 mg by mouth at bedtime.   NUTRITIONAL SUPPLEMENT Liqd Take by mouth. Initiate 4 oz of Medpass by mouth 2 times daily due to weight loss   omeprazole 20 MG capsule Commonly known as:  PRILOSEC Take 20 mg by mouth daily.   risperiDONE 0.25 MG tablet Commonly known as:  RISPERDAL Take 0.25 mg by mouth at bedtime.   sertraline 100 MG tablet Commonly known as:  ZOLOFT Take 100 mg by mouth daily. Anxiety/depression   tamsulosin 0.4 MG Caps capsule Commonly known as:  FLOMAX Take 0.4 mg by mouth daily after supper. 30 mins after supper   UNABLE TO FIND Med Name: Magic Cup give with lunch and dinner to support weight status       No orders of the defined types were placed in this encounter.   Immunization History  Administered Date(s) Administered  . Influenza-Unspecified 06/11/2016  . PPD Test 11/05/2015, 11/20/2015    Social History  Substance Use  Topics  . Smoking status: Former Games developer  . Smokeless tobacco: Never Used  . Alcohol use 0.6 oz/week    1 Cans of beer per week     Comment: occasionallyee    Review of Systems  UTO 2/2 dementia; nursing with no concerns     Vitals:   12/03/16 0942  BP: (!) 158/76  Pulse: 67  Resp: 19  Temp: 97.3 F (36.3 C)   Body mass index is 21.4 kg/m. Physical Exam  GENERAL APPEARANCE: Alert, min conversant, No acute distress  SKIN: No diaphoresis rash HEENT: Unremarkable RESPIRATORY: Breathing is even, unlabored. Lung sounds are clear   CARDIOVASCULAR: Heart RRR no murmurs, rubs or gallops. No peripheral edema  GASTROINTESTINAL: Abdomen is soft, non-tender, not distended w/ normal bowel sounds.  GENITOURINARY: Bladder non tender, not distended  MUSCULOSKELETAL: No abnormal joints  or musculature NEUROLOGIC: Cranial nerves 2-12 grossly intact. Moves all extremities PSYCHIATRIC: dementia, no behavioral issues  Patient Active Problem List   Diagnosis Date Noted  . Mood disorder (HCC) 11/20/2016  . Depression 01/04/2016  . HCAP (healthcare-associated pneumonia) 10/20/2015  . Cerebrovascular accident (CVA) due to stenosis of left middle cerebral artery (HCC) 08/27/2015  . Essential hypertension 08/27/2015  . Seizures (HCC) 08/27/2015  . Type 2 diabetes mellitus with circulatory disorder (HCC) 08/27/2015  . Dementia with behavioral disturbance 08/27/2015  . GERD (gastroesophageal reflux disease) 08/13/2015  . Vascular dementia with behavior disturbance 08/07/2015  . Dyslipidemia associated with type 2 diabetes mellitus (HCC) 08/07/2015  . 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 Subdural hematoma (HCC) 07/30/2015  . Dementia 06/23/2015  . Benign prostatic hyperplasia 06/23/2015  . CAD (coronary artery disease) s/p stent 06/18/2015  . Diabetes mellitus type 2, controlled (HCC) 06/18/2015  . Hyperlipidemia 06/18/2015  . Diabetes mellitus type 2 with neurological manifestations (HCC) 06/18/2015  . Cerebrovascular accident (CVA) (HCC)   . Intracranial carotid stenosis   . Stroke (HCC) 06/17/2015    CMP     Component Value Date/Time   NA 141 08/26/2016   K 3.8 08/26/2016   CL 101 08/04/2015 0525   CO2 29 08/04/2015 0525   GLUCOSE 149 (H) 08/04/2015 0525   BUN 26 (A) 08/26/2016   CREATININE 1.2 08/26/2016   CREATININE 1.35 (H) 08/04/2015 0525   CALCIUM 8.5 (L) 08/04/2015 0525   PROT 5.8 (L) 07/30/2015 1021   ALBUMIN 2.7 (L) 07/30/2015 1021   AST 16 07/21/2016   ALT 10 07/21/2016   ALKPHOS 116 07/21/2016   BILITOT 0.5 07/30/2015 1021   GFRNONAA 46 (L) 08/04/2015 0525   GFRAA 53 (L) 08/04/2015 0525    Recent Labs  02/18/16 07/21/16 08/26/16  NA 142 143 141  K 3.6 3.7 3.8  BUN 15 20 26*  CREATININE 1.2 1.1 1.2    Recent Labs  07/21/16  AST 16  ALT 10    ALKPHOS 116    Recent Labs  06/15/16 07/21/16 08/26/16  WBC 8.1 8.2 6.5  HGB 13.3* 13.1* 11.7*  HCT 40* 40* 36*  PLT 166 173 181    Recent Labs  04/30/16 06/15/16 07/21/16  CHOL 117 121 130  LDLCALC 59 41 62  TRIG 139 256* 189*   No results found for: [facility] Lab Results  Component Value Date   TSH 2.32 07/21/2016   Lab Results  Component Value Date   HGBA1C 6.6 07/21/2016   Lab Results  Component Value Date   CHOL 130 07/21/2016   HDL 30 (A) 07/21/2016   LDLCALC 62 07/21/2016  TRIG 189 (A) 07/21/2016   CHOLHDL 5.0 06/18/2015    Significant Diagnostic Results in last 30 days:  No results found.  Assessment and Plan  Seizures (HCC) No reported seizures without Keppra;will cont to monitor  Nonorganic psychosis No reported hallucinations or paranoia; plan to cont risperdal 0.25 mg qHS  Dyslipidemia associated with type 2 diabetes mellitus (HCC) LDL and HDL stable; cont lipitor 20 mg daily    Ferol Laiche D. Lyn Hollingshead,  MD

## 2016-12-04 ENCOUNTER — Encounter: Payer: Self-pay | Admitting: Internal Medicine

## 2016-12-05 ENCOUNTER — Encounter: Payer: Self-pay | Admitting: Internal Medicine

## 2016-12-05 NOTE — Assessment & Plan Note (Signed)
LDL and HDL stable; cont lipitor 20 mg daily

## 2016-12-05 NOTE — Assessment & Plan Note (Signed)
No reported seizures without Keppra;will cont to monitor

## 2016-12-05 NOTE — Assessment & Plan Note (Signed)
No reported hallucinations or paranoia; plan to cont risperdal 0.25 mg qHS

## 2017-01-11 DEATH — deceased

## 2017-06-21 IMAGING — CR DG CHEST 2V
2 series · 2 of 2 positions shown · non-contrast
Comparison: None.

CLINICAL DATA: Choked on piece of apple 3 hours ago with a scratchy
substernal sensation. Previous esophageal dilatation procedure.

EXAM:
CHEST  2 VIEW

[w chest lat]
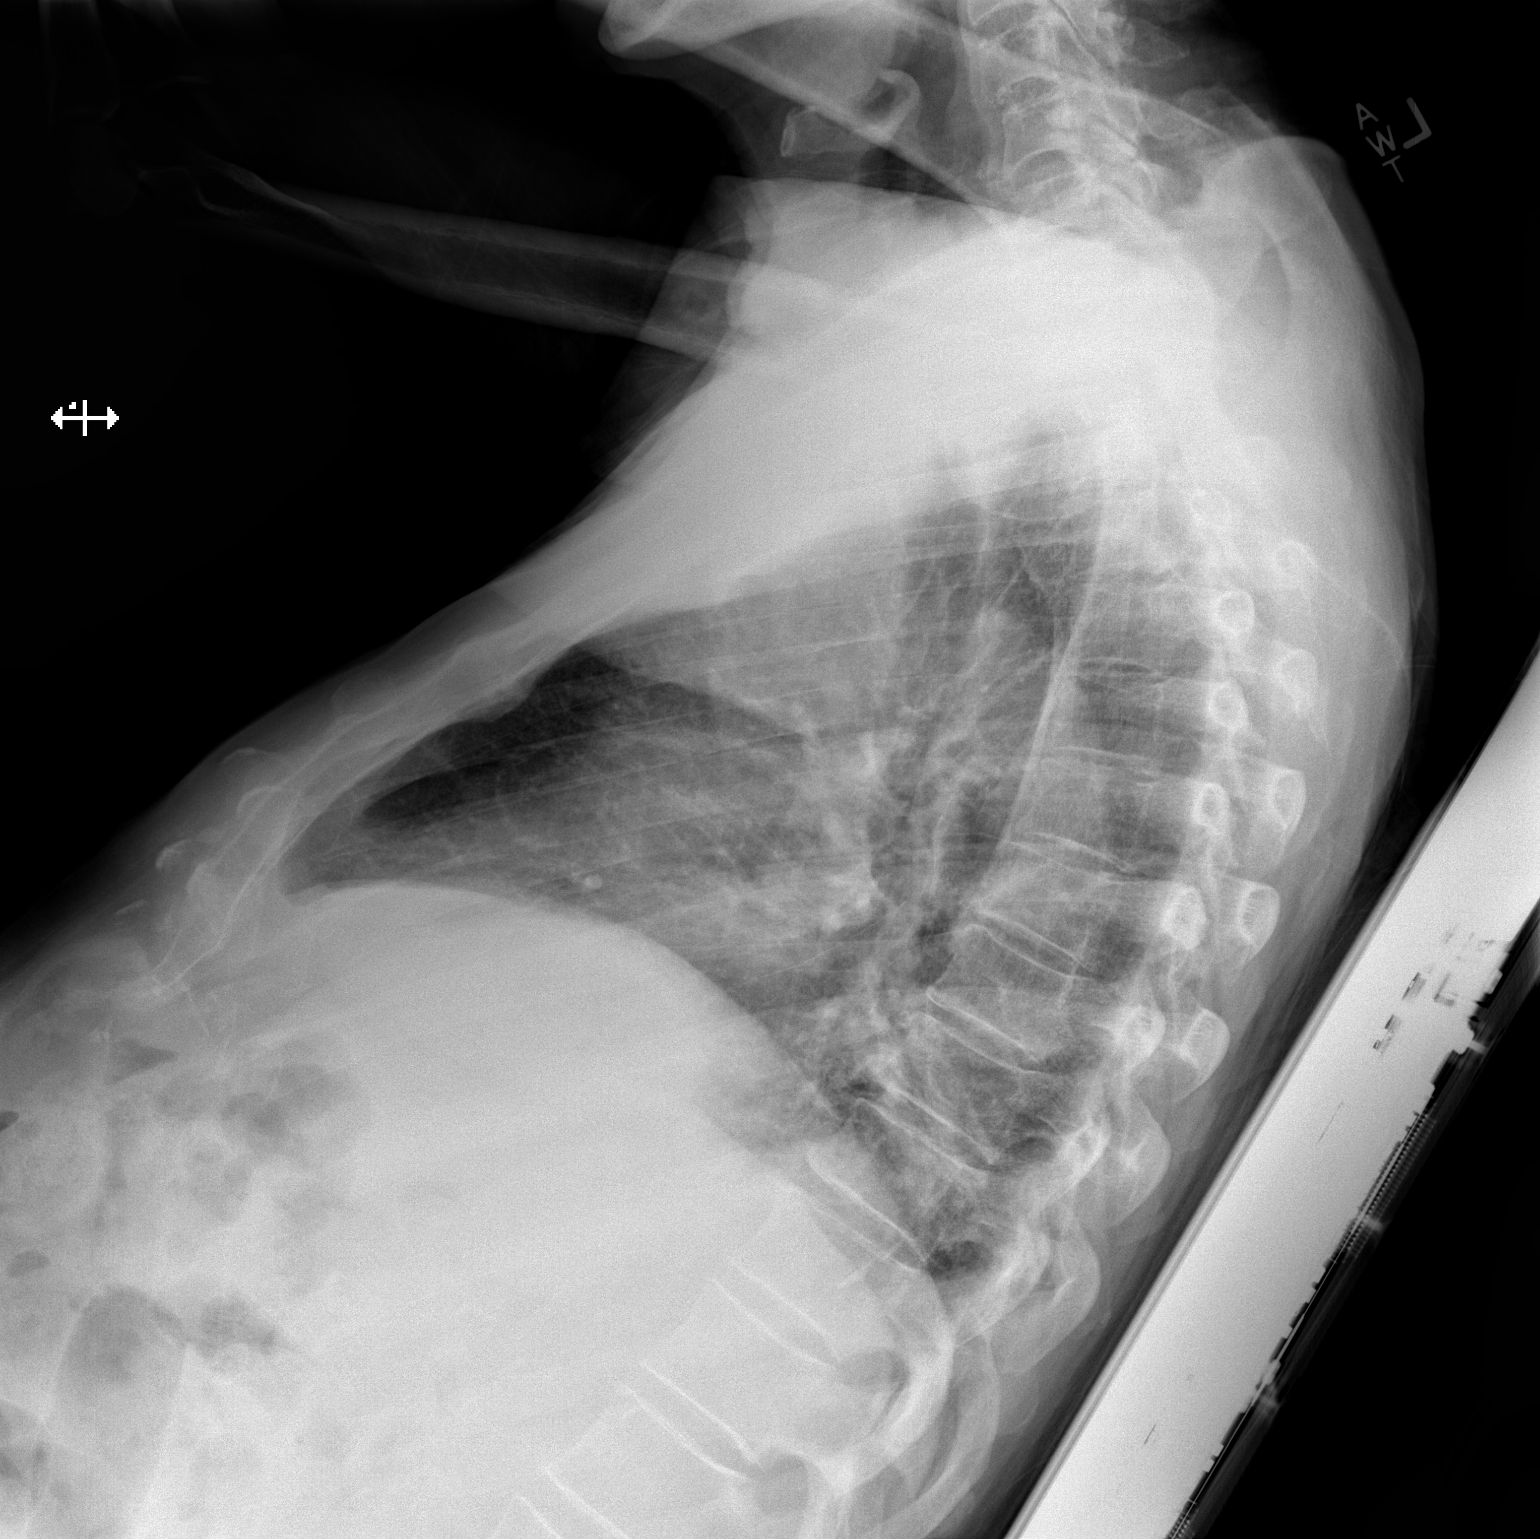

[x chest ap]
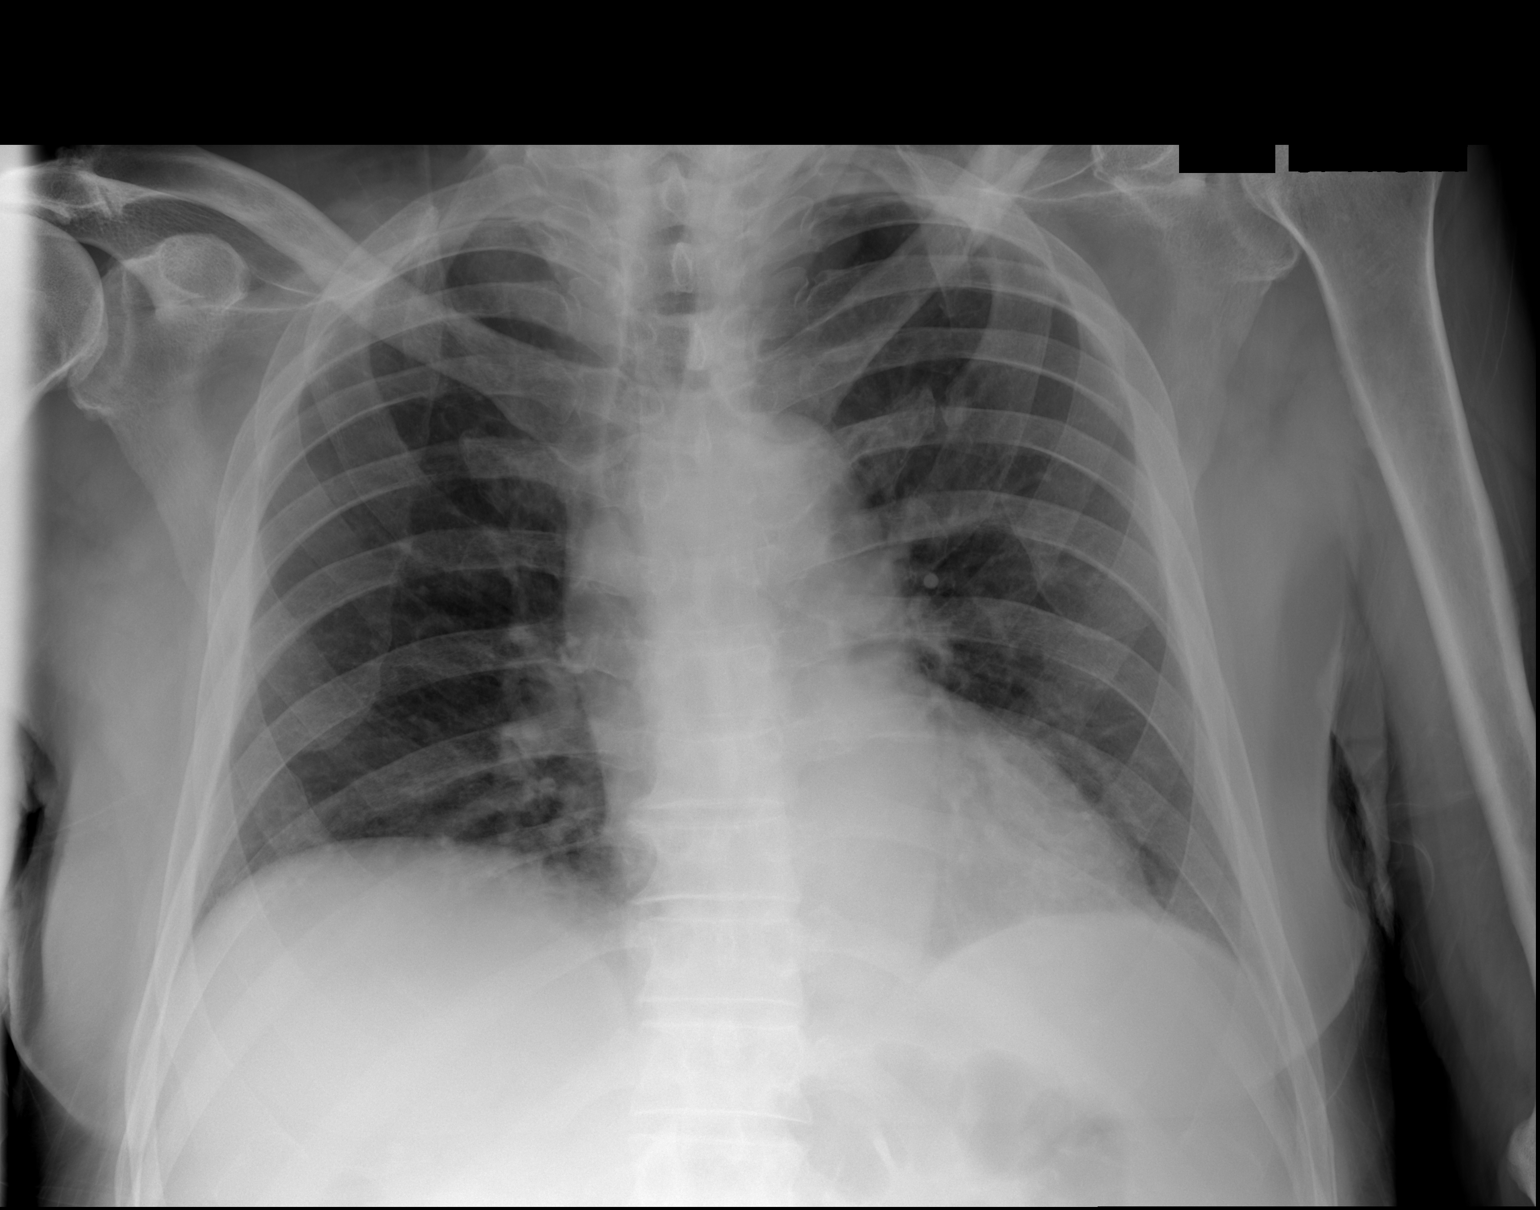

[2 of 2 positions shown; findings below may reference images not displayed]

FINDINGS: Lungs are hypoinflated without consolidation or effusion.
Cardiomediastinal silhouette is within normal. There is mild
degenerate change of the spine.
IMPRESSION: Hypoinflation without acute cardiopulmonary disease.

## 2017-07-26 IMAGING — MR MR MRA HEAD W/O CM
3 series · 18 of 48 positions shown · non-contrast
Comparison: Prior MRI 06/17/2015.

CLINICAL DATA: Initial evaluation for stroke.

EXAM:
MRA HEAD WITHOUT CONTRAST
TECHNIQUE: Angiographic images of the Circle of Willis were obtained using MRA
technique without intravenous contrast.

[Series 3: (id) mt fs · axial · 1.4mm · 0.43mm/px · z∈[-129,-39]mm · 16 of 136 slices shown]
[im 1/136]
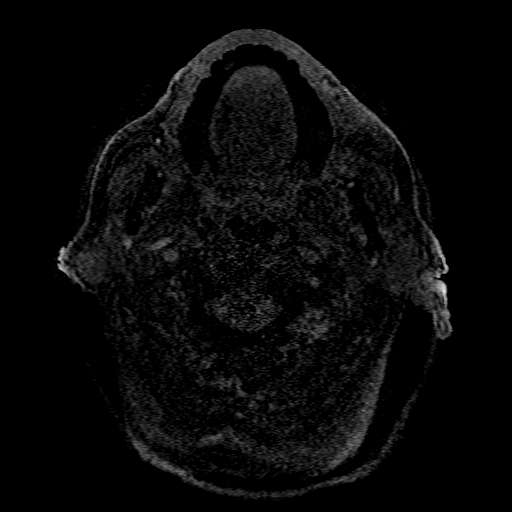
[im 4/136]
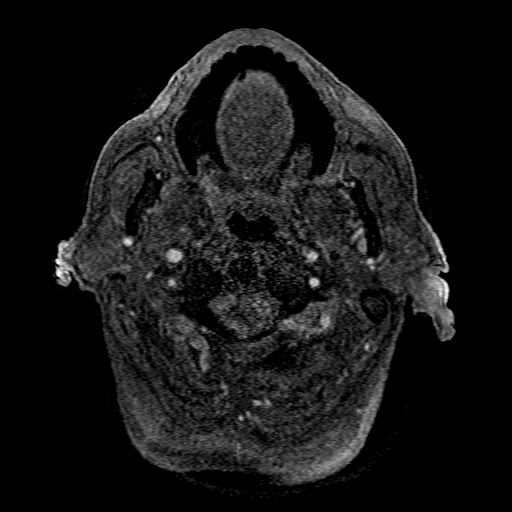
[im 7/136]
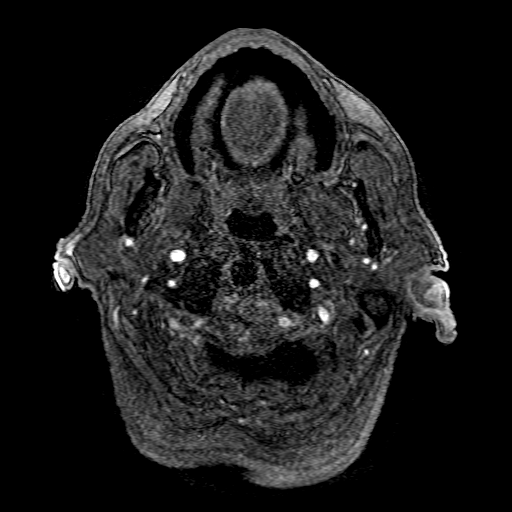
[im 10/136]
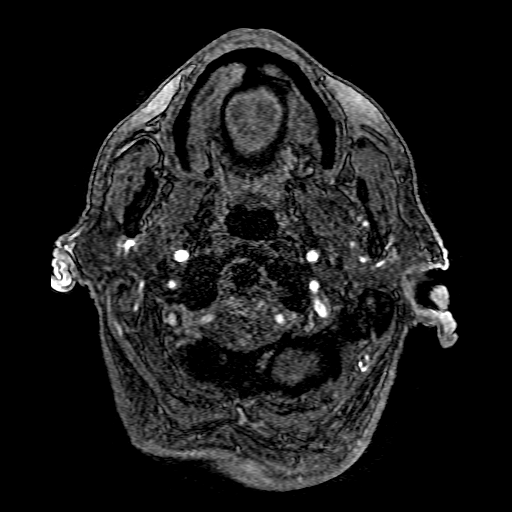
[im 13/136]
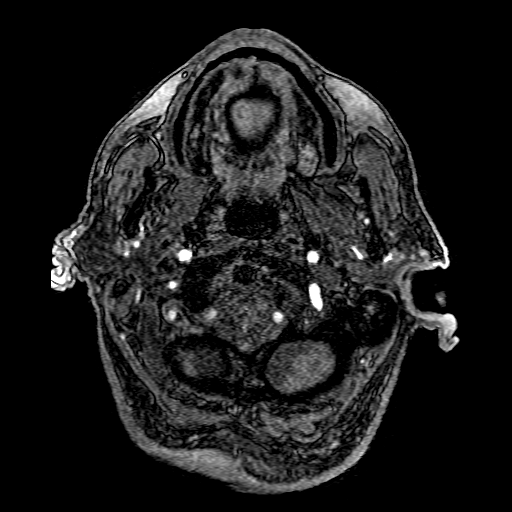
[im 16/136]
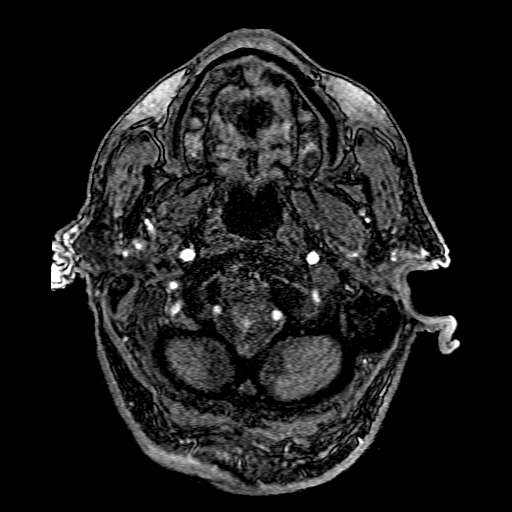
[im 22/136]
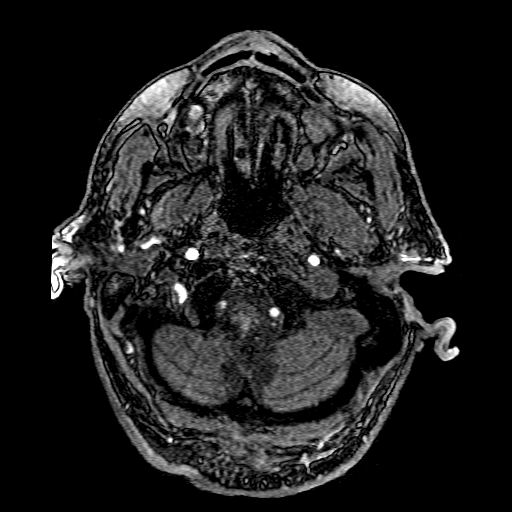
[im 25/136]
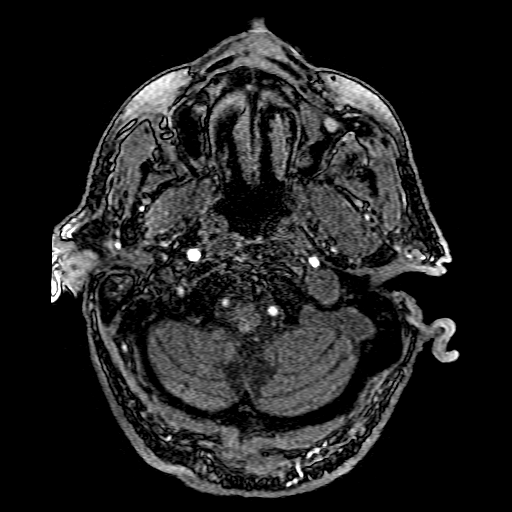
[im 43/136]
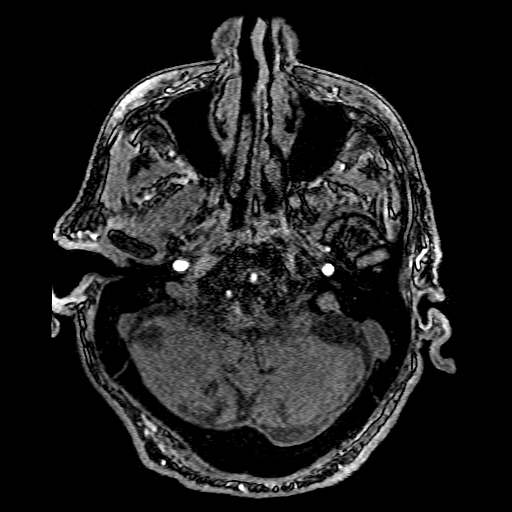
[im 61/136]
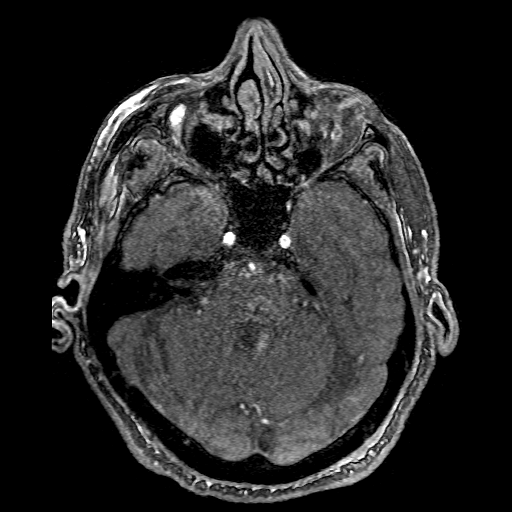
[im 70/136]
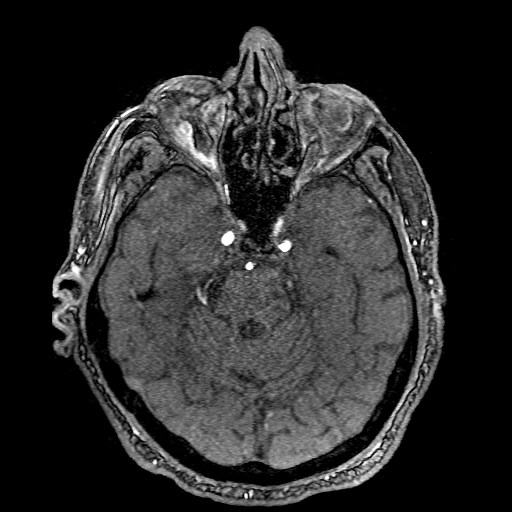
[im 76/136]
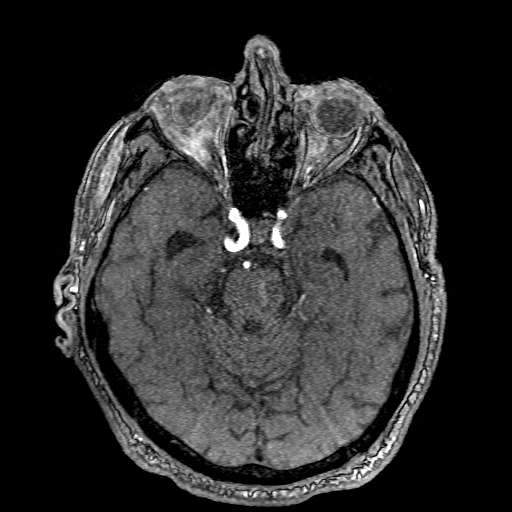
[im 94/136]
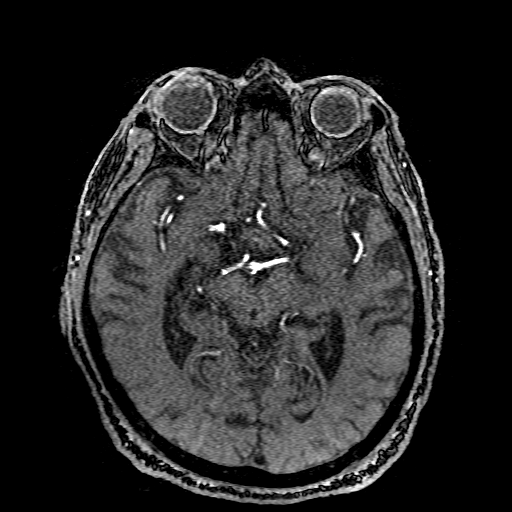
[im 112/136]
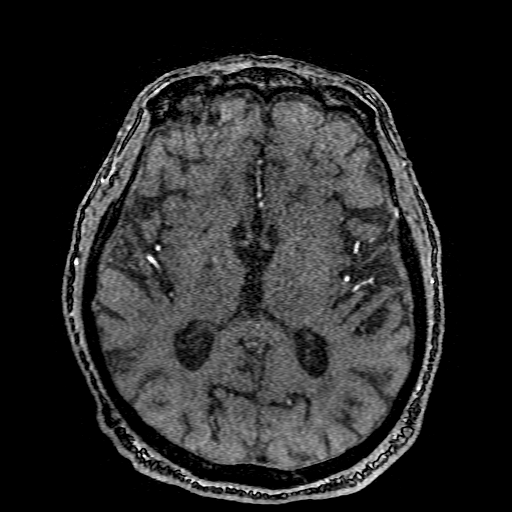
[im 115/136]
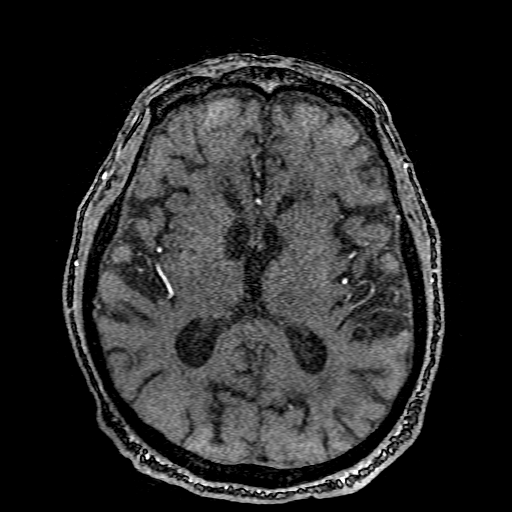
[im 130/136]
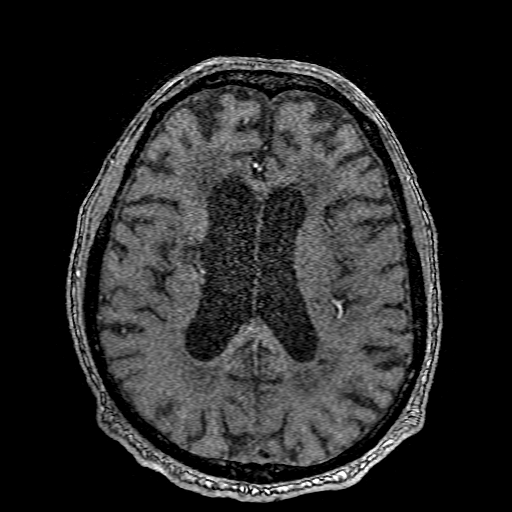

[Series 304: processed images · axial · 1.4mm · 0.27mm/px · 1 of 1 slices shown (1 of 2)]
[im 1/1]
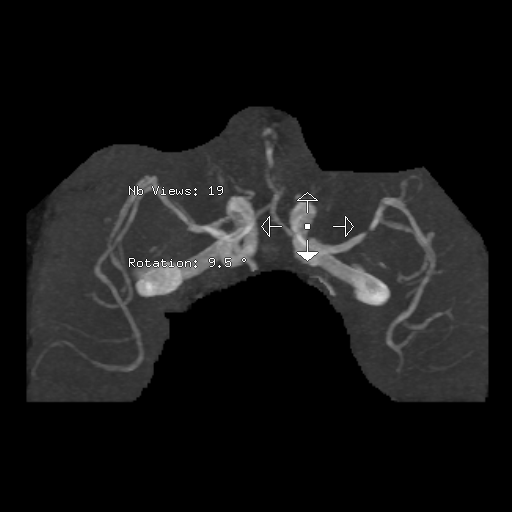

[Series 306: processed images · axial · 1.4mm · 0.27mm/px · 1 of 1 slices shown (2 of 2)]
[im 1/1]
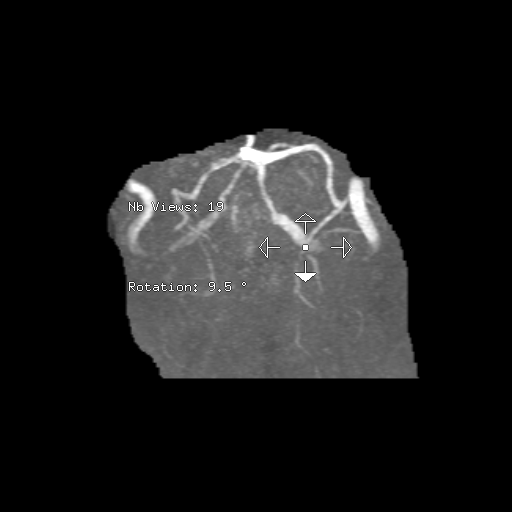

[18 of 48 positions shown; findings below may reference images not displayed]

FINDINGS: ANTERIOR CIRCULATION:

Visualized distal cervical segments of the internal carotid arteries
are patent with antegrade flow. The petrous segments are well
opacified. Scattered atheromatous irregularity present within mean
cavernous/ supraclinoid segments bilaterally without hemodynamically
significant stenosis. Mild to moderate narrowing of the supra
clinoid segments bilaterally.

Right A1 segment is hypoplastic and irregular. Left A1 segment is
well opacified. Anterior communicating artery normal. Anterior
cerebral arteries grossly opacified to their distal aspects.
Probable multi focal atheromatous irregularity within the anterior
cerebral arteries bilaterally.

Right M1 segment patent to the right MCA bifurcation it without
focal stenosis or occlusion. Distal right MCA branches well
opacified but demonstrate multi focal atheromatous irregularity.

Left M1 segment patent proximally. There is a focal short segment
high-grade stenosis within the distal left M1 segment measuring
approximately 2 mm in length (series 304, image 10). Left MCA
branches are opacified distally.

POSTERIOR CIRCULATION:

Left vertebral artery is dominant and patent to the vertebrobasilar
junction. Multi focal atheromatous irregularity with moderate
narrowing present within the distal left V4 segment. Right vertebral
artery and demonstrates multi focal atheromatous irregularity but is
patent to the vertebrobasilar junction as well. Posterior inferior
cerebral arteries not well evaluated on this exam. Basilar patent
with multi focal atheromatous irregularity and mild stenoses.
Superior cerebellar arteries opacified proximally. Left posterior
cerebral artery arises from the basilar artery. Left P1 segment
widely patent. Left P2 segment opacified to its distal aspect.
Distal branch atheromatous irregularity within the distal left PCA.
There is fetal origin of the right PCA with a in widely patent right
posterior communicating artery. Right PCA demonstrates multi focal
atheromatous regularity and is somewhat attenuated distally.

No aneurysm or vascular malformation.
IMPRESSION: 1. No large or proximal arterial branch occlusion within the
intracranial circulation.
2. Focal high-grade severe stenosis within the distal left M1
segment measuring 2 mm in length.
3. Additional fairly advanced multi focal atheromatous irregularity
throughout the intracranial circulation as above.
